# Patient Record
Sex: Male | Born: 1955 | Race: White | Hispanic: No | Marital: Married | Smoking: Never smoker
Health system: Southern US, Community
[De-identification: ages and names within clinical notes are randomized; demographics above are authoritative.]

## PROBLEM LIST (undated history)

## (undated) DIAGNOSIS — M1990 Unspecified osteoarthritis, unspecified site: Secondary | ICD-10-CM

## (undated) DIAGNOSIS — M549 Dorsalgia, unspecified: Secondary | ICD-10-CM

## (undated) DIAGNOSIS — E739 Lactose intolerance, unspecified: Secondary | ICD-10-CM

## (undated) DIAGNOSIS — G5760 Lesion of plantar nerve, unspecified lower limb: Secondary | ICD-10-CM

## (undated) DIAGNOSIS — I10 Essential (primary) hypertension: Secondary | ICD-10-CM

## (undated) DIAGNOSIS — E8881 Metabolic syndrome: Secondary | ICD-10-CM

## (undated) DIAGNOSIS — Z91018 Allergy to other foods: Secondary | ICD-10-CM

## (undated) DIAGNOSIS — G4730 Sleep apnea, unspecified: Secondary | ICD-10-CM

## (undated) DIAGNOSIS — T7840XA Allergy, unspecified, initial encounter: Secondary | ICD-10-CM

## (undated) DIAGNOSIS — E785 Hyperlipidemia, unspecified: Secondary | ICD-10-CM

## (undated) DIAGNOSIS — E119 Type 2 diabetes mellitus without complications: Secondary | ICD-10-CM

## (undated) DIAGNOSIS — F32A Depression, unspecified: Secondary | ICD-10-CM

## (undated) DIAGNOSIS — J189 Pneumonia, unspecified organism: Secondary | ICD-10-CM

## (undated) DIAGNOSIS — F419 Anxiety disorder, unspecified: Secondary | ICD-10-CM

## (undated) DIAGNOSIS — R7303 Prediabetes: Secondary | ICD-10-CM

## (undated) DIAGNOSIS — E669 Obesity, unspecified: Secondary | ICD-10-CM

## (undated) DIAGNOSIS — D649 Anemia, unspecified: Secondary | ICD-10-CM

## (undated) HISTORY — DX: Dorsalgia, unspecified: M54.9

## (undated) HISTORY — PX: UMBILICAL HERNIA REPAIR: SHX2598

## (undated) HISTORY — DX: Essential (primary) hypertension: I10

## (undated) HISTORY — DX: Obesity, unspecified: E66.9

## (undated) HISTORY — DX: Lesion of plantar nerve, unspecified lower limb: G57.60

## (undated) HISTORY — DX: Sleep apnea, unspecified: G47.30

## (undated) HISTORY — DX: Anemia, unspecified: D64.9

## (undated) HISTORY — DX: Unspecified osteoarthritis, unspecified site: M19.90

## (undated) HISTORY — DX: Anxiety disorder, unspecified: F41.9

## (undated) HISTORY — DX: Hyperlipidemia, unspecified: E78.5

## (undated) HISTORY — DX: Allergy, unspecified, initial encounter: T78.40XA

## (undated) HISTORY — DX: Allergy to other foods: Z91.018

## (undated) HISTORY — DX: Depression, unspecified: F32.A

## (undated) HISTORY — PX: APPENDECTOMY: SHX54

## (undated) HISTORY — PX: HERNIA REPAIR: SHX51

## (undated) HISTORY — DX: Lactose intolerance, unspecified: E73.9

---

## 2006-05-26 ENCOUNTER — Encounter: Admission: RE | Admit: 2006-05-26 | Discharge: 2006-05-26 | Payer: Self-pay | Admitting: Emergency Medicine

## 2006-07-05 ENCOUNTER — Ambulatory Visit (HOSPITAL_BASED_OUTPATIENT_CLINIC_OR_DEPARTMENT_OTHER): Admission: RE | Admit: 2006-07-05 | Discharge: 2006-07-05 | Payer: Self-pay | Admitting: Otolaryngology

## 2006-07-09 ENCOUNTER — Ambulatory Visit: Payer: Self-pay | Admitting: Internal Medicine

## 2008-11-28 HISTORY — PX: COLONOSCOPY: SHX174

## 2014-12-22 ENCOUNTER — Ambulatory Visit (INDEPENDENT_AMBULATORY_CARE_PROVIDER_SITE_OTHER): Payer: BC Managed Care – PPO | Admitting: Family Medicine

## 2014-12-22 ENCOUNTER — Encounter: Payer: Self-pay | Admitting: Family Medicine

## 2014-12-22 VITALS — BP 142/90 | HR 82 | Ht 67.0 in | Wt 218.0 lb

## 2014-12-22 DIAGNOSIS — G5762 Lesion of plantar nerve, left lower limb: Secondary | ICD-10-CM

## 2014-12-22 DIAGNOSIS — M9903 Segmental and somatic dysfunction of lumbar region: Secondary | ICD-10-CM

## 2014-12-22 DIAGNOSIS — M545 Low back pain, unspecified: Secondary | ICD-10-CM

## 2014-12-22 DIAGNOSIS — M999 Biomechanical lesion, unspecified: Secondary | ICD-10-CM

## 2014-12-22 DIAGNOSIS — M9904 Segmental and somatic dysfunction of sacral region: Secondary | ICD-10-CM

## 2014-12-22 DIAGNOSIS — M9902 Segmental and somatic dysfunction of thoracic region: Secondary | ICD-10-CM

## 2014-12-22 NOTE — Progress Notes (Signed)
[facility] [place], [place] [address] Phone: [phone] Subjective:     CC: Back pain  [id number] Lucas Young is a 59 y.o. male coming in with complaint of back pain.  States that this is been more of a chronic problem. Patient states this seems to be on the left side. Patient points to more of the thoracolumbar junction. Patient states that he is been to many different professionals for this including an orthopedic did take x-rays with no eye needs. Patient was given a steroid injection with no relief. Patient has tried many different over-the-counter medicines and some prescription medications with mild improvement. Patient has also gone to a massage therapist, acupuncture as well as chiropractor with once again minimal benefit. Patient continues to have recurrent pain. Patient states that it is very localized with no radiation. Patient denies any numbness or tingling in the extremities. Denies any fevers or chills or any abnormal weight loss. Denies any nighttime awakening. Patient rates the severity of pain a 7 out of 10. Hurts more when he is sitting still. Feels better when he is doing activity but then have spasming later on in the day.    Past medical history, social, surgical and family history all reviewed in electronic medical record.   Review of Systems: No headache, visual changes, nausea, vomiting, diarrhea, constipation, dizziness, abdominal pain, skin rash, fevers, chills, night sweats, weight loss, swollen lymph nodes, body aches, joint swelling, muscle aches, chest pain, shortness of breath, mood changes.   Objective Blood pressure 142/90, pulse 82, height 5\' 7"  (1.702 m), weight 218 lb (98.884 kg), SpO2 95 %.  General: No apparent distress alert and oriented x3 mood and affect normal, dressed appropriately. Overweight HEENT: Pupils equal, extraocular movements intact  Respiratory: Patient's speak in full sentences  and does not appear short of breath  Cardiovascular: No lower extremity edema, non tender, no erythema  Skin: Warm dry intact with no signs of infection or rash on extremities or on axial skeleton.  Abdomen: Soft nontender  Neuro: Cranial nerves II through XII are intact, neurovascularly intact in all extremities with 2+ DTRs and 2+ pulses.  Lymph: No lymphadenopathy of posterior or anterior cervical chain or axillae bilaterally.  Gait normal with good balance and coordination.  MSK:  Non tender with full range of motion and good stability and symmetric strength and tone of shoulders, elbows, wrist, hip, knee and ankles bilaterally.  Back Exam:  Inspection: Mild increase in lordosis Motion: Flexion 45 deg, Extension 45 deg, Side Bending to 45 deg bilaterally,  Rotation to 45 deg bilaterally  SLR laying: Negative  XSLR laying: Negative  Palpable tenderness: Moderate tenderness to palpation over the paraspinal musculature of the lumbar spine especially of her L2 FABER: Mild increased tightness on left side dividing tightness of the hip flexors bilaterally left greater than right Sensory change: Gross sensation intact to all lumbar and sacral dermatomes.  Reflexes: 2+ at both patellar tendons, 2+ at achilles tendons, Babinski's downgoing.  Strength at foot  Plantar-flexion: 5/5 Dorsi-flexion: 5/5 Eversion: 5/5 Inversion: 5/5  Leg strength  Quad: 5/5 Hamstring: 5/5 Hip flexor: 5/5 Hip abductors: 3+/5 but symmetric Gait unremarkable.  OMT Physical Exam   Standing flexion right  Seated Flexion right  Cervical  Neutral  Thoracic T5 extended rotated and side bent right T7 extended rotated and side bent left  Lumbar L2 flexed rotated and side bent left  Sacrum Right on right  Illium  Neutral  Impression and Recommendations:     This case required medical decision making of moderate complexity.

## 2014-12-22 NOTE — Assessment & Plan Note (Signed)
Decision today to treat with OMT was based on Physical Exam  After verbal consent patient was treated with HVLA, ME techniques in thoracic, lumbar and sacral areas  Patient tolerated the procedure well with improvement in symptoms  Patient given exercises, stretches and lifestyle modifications  See medications in patient instructions if given  Patient will follow up in 2-3 weeks          

## 2014-12-22 NOTE — Assessment & Plan Note (Signed)
Patient did bring up this problem when he was leaving. We will address in greater detail at follow-up.

## 2014-12-22 NOTE — Assessment & Plan Note (Signed)
Patient's low back pain seems to be multifactorial. The underlying problem is secondary to the tight hip flexors and the weak hip abductors. Think that this is causing chronic strain on his hip flexor that originates at the L2 vertebrae. Patient did respond fairly well to osteopathic manipulation today. We discussed over-the-counter medicines and patient was given a topical anti-inflammatory. We discussed home exercises and patient did work with a Product/process development scientist and grade detail today. She'll try to make these changes and come back in 2-3 weeks. Continuing to have difficulty imaging may be necessary. I anticipate though patient doing very well. We also discussed ergonomic changes at work.

## 2014-12-22 NOTE — Progress Notes (Signed)
Pre visit review using our clinic review tool, if applicable. No additional management support is needed unless otherwise documented below in the visit note. 

## 2014-12-22 NOTE — Patient Instructions (Signed)
Good to see you Ice 20 minutes 2 times daily. Usually after activity and before bed. Exercises 3 times a week.  Exercises on wall.  Heel and butt touching.  Raise leg 6 inches and hold 2 seconds.  Down slow for count of 4 seconds.  1 set of 30 reps daily on both sides.  Turmeric 500mg  twice daily Vitamin D 2000 IU daily When sitting, at desk have monitor at eye level.  Roll up towel under knees with sitting Pennsaid twice daily Try ice bath at night for 20 minutes.  See me again  In 2-3 weeks.

## 2015-01-12 ENCOUNTER — Ambulatory Visit (INDEPENDENT_AMBULATORY_CARE_PROVIDER_SITE_OTHER): Payer: BC Managed Care – PPO | Admitting: Family Medicine

## 2015-01-12 ENCOUNTER — Encounter: Payer: Self-pay | Admitting: Family Medicine

## 2015-01-12 VITALS — BP 144/90 | HR 80 | Ht 67.0 in | Wt 220.0 lb

## 2015-01-12 DIAGNOSIS — M9902 Segmental and somatic dysfunction of thoracic region: Secondary | ICD-10-CM

## 2015-01-12 DIAGNOSIS — G5762 Lesion of plantar nerve, left lower limb: Secondary | ICD-10-CM

## 2015-01-12 DIAGNOSIS — M9903 Segmental and somatic dysfunction of lumbar region: Secondary | ICD-10-CM

## 2015-01-12 DIAGNOSIS — M545 Low back pain, unspecified: Secondary | ICD-10-CM

## 2015-01-12 DIAGNOSIS — M9904 Segmental and somatic dysfunction of sacral region: Secondary | ICD-10-CM

## 2015-01-12 DIAGNOSIS — M999 Biomechanical lesion, unspecified: Secondary | ICD-10-CM

## 2015-01-12 NOTE — Assessment & Plan Note (Signed)
Patient will be fitted with orthotics at follow-up.

## 2015-01-12 NOTE — Assessment & Plan Note (Signed)
Patient's back pain is still secondary 10 hip flexor tightness. We discussed different exercises at home and self manual massage techniques today. Patient was showed proper technique of some the exercises. We discussed icing regimen and home exercises as well as the vitamin supplementation. Patient will come back and see me again in 3-4 weeks for further evaluation and treatment.

## 2015-01-12 NOTE — Assessment & Plan Note (Signed)
Decision today to treat with OMT was based on Physical Exam  After verbal consent patient was treated with HVLA, ME techniques in thoracic, lumbar and sacral areas  Patient tolerated the procedure well with improvement in symptoms  Patient given exercises, stretches and lifestyle modifications  See medications in patient instructions if given  Patient will follow up in 5 weeks

## 2015-01-12 NOTE — Progress Notes (Signed)
  [name] [address] [place], [place] [address] Phone: [phone] Subjective:     CC: Back pain , follow-up  [id number] Lucas Young is a 59 y.o. male coming in with complaint of back pain.  Patient did have more of a hip flexor spasm. Patient states that overall he has been doing somewhat better especially because he is started going to yoga class on a more regular basis. Patient states that the pain is more of a dull throbbing sensation is has only to spasm since last time. Denies any numbness or tingling. States that this is not stopping him from daily activities. Overall probably 60% better.  Patient is also had pain in his foot. Patient left foot. Patient has had a Morton's neuroma and states that it is moderately improved. Not stopping him from any activities at this time.   Past medical history, social, surgical and family history all reviewed in electronic medical record.   Review of Systems: No headache, visual changes, nausea, vomiting, diarrhea, constipation, dizziness, abdominal pain, skin rash, fevers, chills, night sweats, weight loss, swollen lymph nodes, body aches, joint swelling, muscle aches, chest pain, shortness of breath, mood changes.   Objective Blood pressure 144/90, weight 220 lb (99.791 kg).  General: No apparent distress alert and oriented x3 mood and affect normal, dressed appropriately. Overweight HEENT: Pupils equal, extraocular movements intact  Respiratory: Patient's speak in full sentences and does not appear short of breath  Cardiovascular: No lower extremity edema, non tender, no erythema  Skin: Warm dry intact with no signs of infection or rash on extremities or on axial skeleton.  Abdomen: Soft nontender  Neuro: Cranial nerves II through XII are intact, neurovascularly intact in all extremities with 2+ DTRs and 2+ pulses.  Lymph: No lymphadenopathy of posterior or anterior cervical chain or axillae  bilaterally.  Gait normal with good balance and coordination.  MSK:  Non tender with full range of motion and good stability and symmetric strength and tone of shoulders, elbows, wrist, hip, knee and ankles bilaterally.   Foot exam shows the patient does have mild overpronation of the hindfoot as well as severe right and of the transverse arch bilaterally left greater than right. Patient has mild bunion and bunionette formation. \ Back Exam:  Inspection: Mild increase in lordosis Motion: Flexion 45 deg, Extension 45 deg, Side Bending to 45 deg bilaterally,  Rotation to 45 deg bilaterally  SLR laying: Negative  XSLR laying: Negative  Palpable tenderness: This over the T12-L1 region on left side FABER: Mild increased tightness on left side hip flexor tightness of the hip flexors bilaterally left greater than right continues been mild improvement from previous exam Sensory change: Gross sensation intact to all lumbar and sacral dermatomes.  Reflexes: 2+ at both patellar tendons, 2+ at achilles tendons, Babinski's downgoing.  Strength at foot  Plantar-flexion: 5/5 Dorsi-flexion: 5/5 Eversion: 5/5 Inversion: 5/5  Leg strength  Quad: 5/5 Hamstring: 5/5 Hip flexor: 5/5 Hip abductors: 3+/5 but symmetric Gait unremarkable.  OMT Physical Exam   Standing flexion right  Seated Flexion right  Cervical  Neutral  Thoracic T5 extended rotated and side bent right T7 extended rotated and side bent left  Lumbar L1 flexed rotated and side bent left  Sacrum Right on right  Illium  Neutral     Impression and Recommendations:     This case required medical decision making of moderate complexity.

## 2015-01-12 NOTE — Progress Notes (Signed)
Pre visit review using our clinic review tool, if applicable. No additional management support is needed unless otherwise documented below in the visit note. 

## 2015-01-12 NOTE — Patient Instructions (Addendum)
Good to see you Continue what you are doing Yoga is doing great Continue the exercises Nothing new.  COnsider when spsam try tennisball under rib and localize forces with a weight in left hand over area and breath deep.  Lets go 5 weeks.

## 2015-01-26 ENCOUNTER — Ambulatory Visit (INDEPENDENT_AMBULATORY_CARE_PROVIDER_SITE_OTHER): Payer: BC Managed Care – PPO | Admitting: Family Medicine

## 2015-01-26 ENCOUNTER — Encounter: Payer: Self-pay | Admitting: Family Medicine

## 2015-01-26 DIAGNOSIS — G5762 Lesion of plantar nerve, left lower limb: Secondary | ICD-10-CM | POA: Diagnosis not present

## 2015-01-26 NOTE — Assessment & Plan Note (Signed)
Patient does need custom orthotics today and was given a metatarsal pad. I think that this will be helpful and we'll stop patient's progression. We discussed icing, and continuing the same conservative therapy and patient will come back in 2-3 weeks for further evaluation and treatment.

## 2015-01-26 NOTE — Progress Notes (Signed)
Procedure Note   Patient was fitted for a : standard, cushioned, semi-rigid orthotic. The orthotic was heated and afterward the patient patient seated position and molded The patient was positioned in subtalar neutral position and 10 degrees of ankle dorsiflexion in a weight bearing stance. After completion of molding, patient did have orthotic management The blank was ground to a stable position for weight bearing. Size:10 Base: Carbon fiber Additional Posting and Padding: Metatarsal pad The patient ambulated these, and they were very comfortable.

## 2015-01-26 NOTE — Progress Notes (Signed)
Pre visit review using our clinic review tool, if applicable. No additional management support is needed unless otherwise documented below in the visit note. 

## 2015-02-12 ENCOUNTER — Encounter: Payer: Self-pay | Admitting: Family Medicine

## 2015-02-12 ENCOUNTER — Telehealth: Payer: Self-pay | Admitting: Family Medicine

## 2015-02-16 ENCOUNTER — Ambulatory Visit (INDEPENDENT_AMBULATORY_CARE_PROVIDER_SITE_OTHER): Payer: BC Managed Care – PPO | Admitting: Family Medicine

## 2015-02-16 ENCOUNTER — Encounter: Payer: Self-pay | Admitting: Family Medicine

## 2015-02-16 VITALS — BP 142/88 | HR 64 | Wt 214.0 lb

## 2015-02-16 DIAGNOSIS — M9902 Segmental and somatic dysfunction of thoracic region: Secondary | ICD-10-CM

## 2015-02-16 DIAGNOSIS — M9904 Segmental and somatic dysfunction of sacral region: Secondary | ICD-10-CM

## 2015-02-16 DIAGNOSIS — M545 Low back pain, unspecified: Secondary | ICD-10-CM

## 2015-02-16 DIAGNOSIS — M999 Biomechanical lesion, unspecified: Secondary | ICD-10-CM

## 2015-02-16 DIAGNOSIS — M9903 Segmental and somatic dysfunction of lumbar region: Secondary | ICD-10-CM

## 2015-02-16 NOTE — Assessment & Plan Note (Signed)
Patient low back pain is doing significantly better. Patient's yoga class epical be beneficial and patient's orthotics that he has for his shoes now is doing much better as well. Am hoping that this will continue his alignment. Patient did respond well to osteopathic manipulation today and will come back and see me again in 6 weeks for further evaluation and treatment.

## 2015-02-16 NOTE — Assessment & Plan Note (Signed)
Decision today to treat with OMT was based on Physical Exam  After verbal consent patient was treated with HVLA, ME techniques in thoracic, lumbar and sacral areas  Patient tolerated the procedure well with improvement in symptoms  Patient given exercises, stretches and lifestyle modifications  See medications in patient instructions if given  Patient will follow up in 6 weeks

## 2015-02-16 NOTE — Progress Notes (Signed)
Pre visit review using our clinic review tool, if applicable. No additional management support is needed unless otherwise documented below in the visit note. 

## 2015-02-16 NOTE — Progress Notes (Signed)
  [facility] [place], [place] [address] Phone: [phone] Subjective:     CC: Back pain , follow-up  [id number] Lucas Young is a 59 y.o. male coming in with complaint of back pain.  Patient did have more of a hip flexor spasm. Patient states that overall he has been doing somewhat better especially because he is started going to yoga class on a more regular basis. Patient has not had any flares at all and continues to take the natural supplementations. Patient has been very happy overall.  Patient's foot pain has improved because the orthotics.   Past medical history, social, surgical and family history all reviewed in electronic medical record.   Review of Systems: No headache, visual changes, nausea, vomiting, diarrhea, constipation, dizziness, abdominal pain, skin rash, fevers, chills, night sweats, weight loss, swollen lymph nodes, body aches, joint swelling, muscle aches, chest pain, shortness of breath, mood changes.   Objective Blood pressure 142/88, pulse 64, weight 214 lb (97.07 kg).  General: No apparent distress alert and oriented x3 mood and affect normal, dressed appropriately. Overweight HEENT: Pupils equal, extraocular movements intact  Respiratory: Patient's speak in full sentences and does not appear short of breath  Cardiovascular: No lower extremity edema, non tender, no erythema  Skin: Warm dry intact with no signs of infection or rash on extremities or on axial skeleton.  Abdomen: Soft nontender  Neuro: Cranial nerves II through XII are intact, neurovascularly intact in all extremities with 2+ DTRs and 2+ pulses.  Lymph: No lymphadenopathy of posterior or anterior cervical chain or axillae bilaterally.  Gait normal with good balance and coordination.  MSK:  Non tender with full range of motion and good stability and symmetric strength and tone of shoulders, elbows, wrist, hip, knee and ankles bilaterally.     Foot exam shows the patient does have mild overpronation of the hindfoot as well as severe right and of the transverse arch bilaterally left greater than right. Patient has mild bunion and bunionette formation. \ Back Exam:  Inspection: Mild increase in lordosis Motion: Flexion 45 deg, Extension 45 deg, Side Bending to 45 deg bilaterally,  Rotation to 45 deg bilaterally  SLR laying: Negative  XSLR laying: Negative  Palpable tenderness: This over the T12-L1 region on left side FABER: Decreased hip flexors Sensory change: Gross sensation intact to all lumbar and sacral dermatomes.  Reflexes: 2+ at both patellar tendons, 2+ at achilles tendons, Babinski's downgoing.  Strength at foot  Plantar-flexion: 5/5 Dorsi-flexion: 5/5 Eversion: 5/5 Inversion: 5/5  Leg strength  Quad: 5/5 Hamstring: 5/5 Hip flexor: 5/5 Hip abductors: 3+/5 but symmetric Gait unremarkable.  OMT Physical Exam  Cervical  Neutral  Thoracic T5 extended rotated and side bent right T7 extended rotated and side bent left  Lumbar L1 flexed rotated and side bent left  Sacrum Right on right  Illium  Neutral     Impression and Recommendations:     This case required medical decision making of moderate complexity.

## 2015-02-16 NOTE — Patient Instructions (Signed)
Good to see you Ice is your friend I love the idea of the yoga class Continue the vitamins I hope the orthotics continue to help Lucas Young. Or Lucas Young See you in 6 weeks.

## 2015-03-05 NOTE — Telephone Encounter (Signed)
Contacted regarding orthotics question

## 2015-03-20 ENCOUNTER — Encounter: Payer: Self-pay | Admitting: Family Medicine

## 2015-03-31 ENCOUNTER — Ambulatory Visit: Payer: BC Managed Care – PPO | Admitting: Family Medicine

## 2015-03-31 DIAGNOSIS — Z0289 Encounter for other administrative examinations: Secondary | ICD-10-CM

## 2015-04-01 ENCOUNTER — Encounter: Payer: Self-pay | Admitting: Family Medicine

## 2015-04-01 ENCOUNTER — Ambulatory Visit (INDEPENDENT_AMBULATORY_CARE_PROVIDER_SITE_OTHER): Payer: BC Managed Care – PPO | Admitting: Family Medicine

## 2015-04-01 VITALS — BP 122/84 | HR 73 | Ht 67.0 in | Wt 215.0 lb

## 2015-04-01 DIAGNOSIS — M9902 Segmental and somatic dysfunction of thoracic region: Secondary | ICD-10-CM | POA: Diagnosis not present

## 2015-04-01 DIAGNOSIS — M9904 Segmental and somatic dysfunction of sacral region: Secondary | ICD-10-CM | POA: Diagnosis not present

## 2015-04-01 DIAGNOSIS — M9903 Segmental and somatic dysfunction of lumbar region: Secondary | ICD-10-CM

## 2015-04-01 DIAGNOSIS — M545 Low back pain, unspecified: Secondary | ICD-10-CM

## 2015-04-01 DIAGNOSIS — M999 Biomechanical lesion, unspecified: Secondary | ICD-10-CM

## 2015-04-01 NOTE — Progress Notes (Signed)
Pre visit review using our clinic review tool, if applicable. No additional management support is needed unless otherwise documented below in the visit note. 

## 2015-04-01 NOTE — Patient Instructions (Addendum)
You are doing great The classes are working perfect Continue to work on the balancing of the pelvis.  The piriformis is great to focus on.  Ice can still help See me 6-8 weeks.

## 2015-04-01 NOTE — Assessment & Plan Note (Signed)
Patient is doing significantly better. Encourage patient to continue to work on core strengthening potentially weight loss could be beneficial. Patient will continue with his yoga class as well as massage and other modalities that patient is doing at this time. We will make no significant changes in patient will come back and see me again in 6-8 weeks for further evaluation and treatment.

## 2015-04-01 NOTE — Assessment & Plan Note (Signed)
Decision today to treat with OMT was based on Physical Exam  After verbal consent patient was treated with HVLA, ME techniques in thoracic, lumbar and sacral areas  Patient tolerated the procedure well with improvement in symptoms  Patient given exercises, stretches and lifestyle modifications  See medications in patient instructions if given  Patient will follow up in 6-8 weeks    

## 2015-04-01 NOTE — Progress Notes (Signed)
  [name] [place], [place] [address] Phone: [phone] Subjective:     CC: Back pain , follow-up  [id number] Lucas Young is a 59 y.o. male coming in with complaint of back pain.  Patient did have more of a hip flexor spasm. Patient states that overall he has been doing somewhat better especially because he is started going to yoga class on a more regular basis. Patient is now working on the tightness of his hips as well. Patient has been working on the piriformis muscle and has noticed some improvement. Patient still has some chronic dull aching pain from time to time but overall continues to improve.    Past medical history, social, surgical and family history all reviewed in electronic medical record.   Review of Systems: No headache, visual changes, nausea, vomiting, diarrhea, constipation, dizziness, abdominal pain, skin rash, fevers, chills, night sweats, weight loss, swollen lymph nodes, body aches, joint swelling, muscle aches, chest pain, shortness of breath, mood changes.   Objective Blood pressure 122/84, pulse 73, height 5\' 7"  (1.702 m), weight 215 lb (97.523 kg), SpO2 94 %.  General: No apparent distress alert and oriented x3 mood and affect normal, dressed appropriately. Overweight HEENT: Pupils equal, extraocular movements intact  Respiratory: Patient's speak in full sentences and does not appear short of breath  Cardiovascular: No lower extremity edema, non tender, no erythema  Skin: Warm dry intact with no signs of infection or rash on extremities or on axial skeleton.  Abdomen: Soft nontender  Neuro: Cranial nerves II through XII are intact, neurovascularly intact in all extremities with 2+ DTRs and 2+ pulses.  Lymph: No lymphadenopathy of posterior or anterior cervical chain or axillae bilaterally.  Gait normal with good balance and coordination.  MSK:  Non tender with full range of motion and good  stability and symmetric strength and tone of shoulders, elbows, wrist, hip, knee and ankles bilaterally.   Foot exam shows the patient does have mild overpronation of the hindfoot as well as severe right and of the transverse arch bilaterally left greater than right. Patient has mild bunion and bunionette formation.  Back Exam:  Inspection: Mild increase in lordosis Motion: Flexion 45 deg, Extension 45 deg, Side Bending to 45 deg bilaterally,  Rotation to 45 deg bilaterally  SLR laying: Negative  XSLR laying: Negative  Palpable tenderness: Minimal of the paraspinal musculature of the lumbar spine. FABER: Improvement in range of motion but continues to be positive bilaterally Sensory change: Gross sensation intact to all lumbar and sacral dermatomes.  Reflexes: 2+ at both patellar tendons, 2+ at achilles tendons, Babinski's downgoing.  Strength at foot  Plantar-flexion: 5/5 Dorsi-flexion: 5/5 Eversion: 5/5 Inversion: 5/5  Leg strength  Quad: 5/5 Hamstring: 5/5 Hip flexor: 5/5 Hip abductors: 4+/5 but symmetric and improving from previous exam Gait unremarkable.  OMT Physical Exam  Cervical  Neutral  Thoracic T5 extended rotated and side bent right T7 extended rotated and side bent left  Lumbar L1 flexed rotated and side bent left  Sacrum Right on right  Illium  Neutral     Impression and Recommendations:     This case required medical decision making of moderate complexity.

## 2015-05-18 ENCOUNTER — Encounter: Payer: Self-pay | Admitting: Family Medicine

## 2015-05-18 ENCOUNTER — Ambulatory Visit (INDEPENDENT_AMBULATORY_CARE_PROVIDER_SITE_OTHER): Payer: BC Managed Care – PPO | Admitting: Family Medicine

## 2015-05-18 VITALS — BP 132/84 | HR 58 | Ht 67.0 in | Wt 220.0 lb

## 2015-05-18 DIAGNOSIS — M999 Biomechanical lesion, unspecified: Secondary | ICD-10-CM

## 2015-05-18 DIAGNOSIS — M9903 Segmental and somatic dysfunction of lumbar region: Secondary | ICD-10-CM

## 2015-05-18 DIAGNOSIS — M9902 Segmental and somatic dysfunction of thoracic region: Secondary | ICD-10-CM

## 2015-05-18 DIAGNOSIS — M9904 Segmental and somatic dysfunction of sacral region: Secondary | ICD-10-CM | POA: Diagnosis not present

## 2015-05-18 DIAGNOSIS — M545 Low back pain, unspecified: Secondary | ICD-10-CM

## 2015-05-18 NOTE — Progress Notes (Signed)
  [name] [place], [place] [address] Phone: [phone] Subjective:     CC: Back pain , follow-up  [id number] Lucas Young is a 59 y.o. male coming in with complaint of back pain.  Patient did have more of a hip flexor spasm. Patient states that overall he has been doing somewhat better especially because he is started going to yoga class on a more regular basis. Denies any new symptoms. Overall seems to be doing relatively well. States that he still has some mild discomfort from time to time but no significant exacerbations acute is having previously. Patient states over the course last week it seems to be worsening. Denies any new symptoms. Just worsening of previous symptoms. Denies any radiation down the leg. 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    Past medical history, social, surgical and family history all reviewed in electronic medical record.   Review of Systems: No headache, visual changes, nausea, vomiting, diarrhea, constipation, dizziness, abdominal pain, skin rash, fevers, chills, night sweats, weight loss, swollen lymph nodes, body aches, joint swelling, muscle aches, chest pain, shortness of breath, mood changes.   Objective Blood pressure 132/84, pulse 58, height 5\' 7"  (1.702 m), weight 220 lb (99.791 kg), SpO2 96 %.  General: No apparent distress alert and oriented x3 mood and affect normal, dressed appropriately. Overweight HEENT: Pupils equal, extraocular movements intact  Respiratory: Patient's speak in full sentences and does not appear short of breath  Cardiovascular: No lower extremity edema, non tender, no erythema  Skin: Warm dry intact with no signs of infection or rash on extremities or on axial skeleton.  Abdomen: Soft nontender  Neuro: Cranial nerves II through XII are intact, neurovascularly intact in all extremities with 2+ DTRs and 2+  pulses.  Lymph: No lymphadenopathy of posterior or anterior cervical chain or axillae bilaterally.  Gait normal with good balance and coordination.  MSK:  Non tender with full range of motion and good stability and symmetric strength and tone of shoulders, elbows, wrist, hip, knee and ankles bilaterally.   Foot exam shows the patient does have mild overpronation of the hindfoot as well as severe right and of the transverse arch bilaterally left greater than right. Patient has mild bunion and bunionette formation.  Back Exam:  Inspection: Mild increase in lordosis Motion: Flexion 45 deg, Extension 45 deg, Side Bending to 45 deg bilaterally,  Rotation to 45 deg bilaterally  SLR laying: Negative  XSLR laying: Negative  Palpable tenderness: Mild increase in tenderness over the paraspinal musculature FABER: Mark tightness from previous exam  Sensory change: Gross sensation intact to all lumbar and sacral dermatomes.  Reflexes: 2+ at both patellar tendons, 2+ at achilles tendons, Babinski's downgoing.  Strength at foot  Plantar-flexion: 5/5 Dorsi-flexion: 5/5 Eversion: 5/5 Inversion: 5/5  Leg strength  Quad: 5/5 Hamstring: 5/5 Hip flexor: 5/5 Hip abductors: 4+/5 but symmetric with no improvement from previous exam.  Gait unremarkable.  OMT Physical Exam  Cervical  Neutral  Thoracic T5 extended rotated and side bent right T7 extended rotated and side bent left  Lumbar L1 flexed rotated and side bent left  Sacrum Right on right  Illium  Neutral     Impression and Recommendations:     This case required medical decision making of moderate complexity.

## 2015-05-18 NOTE — Assessment & Plan Note (Signed)
Decision today to treat with OMT was based on Physical Exam  After verbal consent patient was treated with HVLA, ME techniques in thoracic, lumbar and sacral areas  Patient tolerated the procedure well with improvement in symptoms  Patient given exercises, stretches and lifestyle modifications  See medications in patient instructions if given  Patient will follow up in 4-6 weeks

## 2015-05-18 NOTE — Assessment & Plan Note (Signed)
Patient back pain is secondary to the piriformis, hip flexor, as well as hip abductor weakness. We discussed again about trying to improve the course strength bending. I do think the patient has been doing very well to increase some strengthening exercises inserted just range of motion. Patient will try to make these different changes and come back and see me again in 4-6 weeks after patient traveling is done.

## 2015-05-18 NOTE — Patient Instructions (Addendum)
Good to see you Ice is your friend when you need it.  Continue the exercises and the yoga, I think they are helping you! Duexis 3 times a day for 3 days if needed Lets go 4-5 weeks.

## 2015-05-18 NOTE — Progress Notes (Signed)
Pre visit review using our clinic review tool, if applicable. No additional management support is needed unless otherwise documented below in the visit note. 

## 2015-07-01 ENCOUNTER — Ambulatory Visit (INDEPENDENT_AMBULATORY_CARE_PROVIDER_SITE_OTHER): Payer: BC Managed Care – PPO | Admitting: Family Medicine

## 2015-07-01 ENCOUNTER — Encounter: Payer: Self-pay | Admitting: Family Medicine

## 2015-07-01 VITALS — BP 136/88 | HR 65 | Ht 67.0 in | Wt 222.0 lb

## 2015-07-01 DIAGNOSIS — M545 Low back pain, unspecified: Secondary | ICD-10-CM

## 2015-07-01 DIAGNOSIS — M9904 Segmental and somatic dysfunction of sacral region: Secondary | ICD-10-CM

## 2015-07-01 DIAGNOSIS — M9903 Segmental and somatic dysfunction of lumbar region: Secondary | ICD-10-CM

## 2015-07-01 DIAGNOSIS — M9902 Segmental and somatic dysfunction of thoracic region: Secondary | ICD-10-CM

## 2015-07-01 DIAGNOSIS — M999 Biomechanical lesion, unspecified: Secondary | ICD-10-CM

## 2015-07-01 NOTE — Patient Instructions (Addendum)
Good to see you Ice is your friend  \Get back to the routine.  Sorry your back in the real world See me again in 6 weeks.

## 2015-07-01 NOTE — Assessment & Plan Note (Signed)
Decision today to treat with OMT was based on Physical Exam  After verbal consent patient was treated with HVLA, ME techniques in thoracic, lumbar and sacral areas  Patient tolerated the procedure well with improvement in symptoms  Patient given exercises, stretches and lifestyle modifications  See medications in patient instructions if given  Patient will follow up in 4-6 weeks

## 2015-07-01 NOTE — Progress Notes (Signed)
Pre visit review using our clinic review tool, if applicable. No additional management support is needed unless otherwise documented below in the visit note. 

## 2015-07-01 NOTE — Progress Notes (Signed)
  [name] [place], [place] [address] Phone: [phone] Subjective:     CC: Back pain , follow-up  [id number] Lucas Young is a 59 y.o. male coming in with complaint of back pain.  Patient has been doing very well with yoga, home exercises, over-the-counter natural supplements, as well as osteopathic manipulation. Patient states overall he is doing very well. Patient discussed back from a 3 week vacation. Patient states that now that he is getting back into the regular regimen history and have some mild increase in low back pain. Patient was not working out as frequently as he was previously. Continues to take the vitamins. No significant new changes to some mild increasing tightness of the lower back.    Past medical history, social, surgical and family history all reviewed in electronic medical record.   Review of Systems: No headache, visual changes, nausea, vomiting, diarrhea, constipation, dizziness, abdominal pain, skin rash, fevers, chills, night sweats, weight loss, swollen lymph nodes, body aches, joint swelling, muscle aches, chest pain, shortness of breath, mood changes.   Objective Blood pressure 136/88, pulse 65, height 5\' 7"  (1.702 m), weight 222 lb (100.699 kg), SpO2 95 %.  General: No apparent distress alert and oriented x3 mood and affect normal, dressed appropriately. Overweight HEENT: Pupils equal, extraocular movements intact  Respiratory: Patient's speak in full sentences and does not appear short of breath  Cardiovascular: No lower extremity edema, non tender, no erythema  Skin: Warm dry intact with no signs of infection or rash on extremities or on axial skeleton.  Abdomen: Soft nontender  Neuro: Cranial nerves II through XII are intact, neurovascularly intact in all extremities with 2+ DTRs and 2+ pulses.  Lymph: No lymphadenopathy of posterior or anterior cervical chain or axillae bilaterally.  Gait  normal with good balance and coordination.  MSK:  Non tender with full range of motion and good stability and symmetric strength and tone of shoulders, elbows, wrist, hip, knee and ankles bilaterally.   Foot exam shows the patient does have mild overpronation of the hindfoot as well as severe right and of the transverse arch bilaterally left greater than right. Patient has mild bunion and bunionette formation.  Back Exam:  Inspection: Mild increase in lordosis Motion: Flexion 45 deg, Extension 45 deg, Side Bending to 45 deg bilaterally,  Rotation to 45 deg bilaterally  SLR laying: Negative  XSLR laying: Negative  Palpable tenderness: Mild increase in tenderness over the paraspinal musculature FABER: Mark tightness from previous exam  Sensory change: Gross sensation intact to all lumbar and sacral dermatomes.  Reflexes: 2+ at both patellar tendons, 2+ at achilles tendons, Babinski's downgoing.  Strength at foot  Plantar-flexion: 5/5 Dorsi-flexion: 5/5 Eversion: 5/5 Inversion: 5/5  Leg strength  Quad: 5/5 Hamstring: 5/5 Hip flexor: 5/5 Hip abductors: 4+/5 but symmetric with no improvement from previous exam.  Gait unremarkable.  OMT Physical Exam  Cervical  Neutral  Thoracic T5 extended rotated and side bent right T7 extended rotated and side bent left  Lumbar L1 flexed rotated and side bent left  Sacrum Right on right  Illium  Neutral     Impression and Recommendations:     This case required medical decision making of moderate complexity.

## 2015-07-01 NOTE — Assessment & Plan Note (Signed)
Patient's back pain is likely multifactorial. Encourage him to continue to work on core strengthening. Patient will restart yoga. I think patient when he gets back into his routine will do very well. Patient come back and see me again in 4-6 weeks.

## 2015-08-03 ENCOUNTER — Ambulatory Visit (INDEPENDENT_AMBULATORY_CARE_PROVIDER_SITE_OTHER): Payer: BC Managed Care – PPO | Admitting: Urgent Care

## 2015-08-03 VITALS — BP 140/90 | HR 67 | Temp 98.2°F | Resp 20 | Ht 67.5 in | Wt 213.1 lb

## 2015-08-03 DIAGNOSIS — L03012 Cellulitis of left finger: Secondary | ICD-10-CM

## 2015-08-03 DIAGNOSIS — M79642 Pain in left hand: Secondary | ICD-10-CM

## 2015-08-03 DIAGNOSIS — IMO0001 Reserved for inherently not codable concepts without codable children: Secondary | ICD-10-CM | POA: Insufficient documentation

## 2015-08-03 MED ORDER — MUPIROCIN 2 % EX OINT: 1.0000 "application " | TOPICAL_OINTMENT | Freq: Three times a day (TID) | CUTANEOUS | Status: DC

## 2015-08-03 NOTE — Patient Instructions (Signed)

## 2015-08-03 NOTE — Progress Notes (Signed)
    MRN: [id number] DOB: [birth year]  Subjective:   Lucas Young is a 59 y.o. male presenting for chief complaint of Hand Pain  Reports 1 day history of left middle finger. Finger is red and painful, has become swollen. He cannot recall any specific trauma but thinks it may have happened while he was doing hedge trimming. Has tried ibuprofen with minimal relief. Denies fever, drainage of pus or bleeding. Denies any other aggravating or relieving factors, no other questions or concerns.  Joie currently has no medications in their medication list. Also has No Known Allergies.  [place]  has a past medical history of Allergy. Also  has past surgical history that includes Appendectomy.  Objective:   Vitals: BP 140/90 mmHg  Pulse 67  Temp(Src) 98.2 F (36.8 C) (Oral)  Resp 20  Ht 5' 7.5" (1.715 m)  Wt 213 lb 2 oz (96.673 kg)  BMI 32.87 kg/m2  SpO2 96%  Physical Exam  Constitutional: He is oriented to person, place, and time. He appears well-developed and well-nourished.  Cardiovascular: Normal rate.   Pulmonary/Chest: Effort normal.  Musculoskeletal:       Left hand: He exhibits tenderness and swelling. He exhibits normal range of motion, no bony tenderness, normal capillary refill, no deformity and no laceration. Normal sensation noted. Normal strength noted.       Hands: Neurological: He is alert and oriented to person, place, and time.  Skin: Skin is warm and dry. No rash noted. No erythema. No pallor.   PROCEDURE NOTE: I&D of Abscess Verbal consent obtained. Local anesthesia with 4cc of 0.5% Marcaine. Site cleansed with alcohol pad prior to injecting numbing medication. Incision of 0.5cm was made using a 11 blade, discharge of copious amounts of pus and serosanguinous fluid. Wound cavity was explored with Adson forceps for loculations and drained further. Cleansed and dressed. After care instructions provided.  Assessment and Plan :   1. Paronychia of  third finger of left hand - Performed I&D of his paronychia. Start topical Bactroban TID. - Wound culture pending. - Will hold off on oral antibiotics but if patient does not see significant improvement, will start course corresponding with culture results.  Jaynee Eagles, PA-C [facility] [phone] 08/03/2015 8:22 AM

## 2015-08-03 NOTE — Addendum Note (Signed)
Addended byCandice Camp on: 08/03/2015 09:05 AM   Modules accepted: Orders

## 2015-08-07 LAB — WOUND CULTURE
GRAM STAIN: NONE SEEN
Gram Stain: NONE SEEN

## 2015-08-10 ENCOUNTER — Encounter: Payer: Self-pay | Admitting: Family Medicine

## 2015-08-10 ENCOUNTER — Ambulatory Visit (INDEPENDENT_AMBULATORY_CARE_PROVIDER_SITE_OTHER): Payer: BC Managed Care – PPO | Admitting: Family Medicine

## 2015-08-10 VITALS — BP 136/84 | HR 67 | Ht 67.5 in | Wt 211.0 lb

## 2015-08-10 DIAGNOSIS — M999 Biomechanical lesion, unspecified: Secondary | ICD-10-CM

## 2015-08-10 DIAGNOSIS — M9903 Segmental and somatic dysfunction of lumbar region: Secondary | ICD-10-CM | POA: Diagnosis not present

## 2015-08-10 DIAGNOSIS — M9902 Segmental and somatic dysfunction of thoracic region: Secondary | ICD-10-CM | POA: Diagnosis not present

## 2015-08-10 DIAGNOSIS — M545 Low back pain, unspecified: Secondary | ICD-10-CM

## 2015-08-10 DIAGNOSIS — M9904 Segmental and somatic dysfunction of sacral region: Secondary | ICD-10-CM

## 2015-08-10 NOTE — Assessment & Plan Note (Signed)
Decision today to treat with OMT was based on Physical Exam  After verbal consent patient was treated with HVLA, ME techniques in thoracic, lumbar and sacral areas  Patient tolerated the procedure well with improvement in symptoms  Patient given exercises, stretches and lifestyle modifications  See medications in patient instructions if given  Patient will follow up in 4-6 weeks          

## 2015-08-10 NOTE — Progress Notes (Signed)
Pre visit review using our clinic review tool, if applicable. No additional management support is needed unless otherwise documented below in the visit note. 

## 2015-08-10 NOTE — Patient Instructions (Addendum)
You have done great for the last 5 weeks! Continue to focus on the posture and the strengthening of the core. Overall I am impressed Get back to the yoga 2-3 times a week at least Lets go for 5-6 weeks.

## 2015-08-10 NOTE — Assessment & Plan Note (Signed)
Continues to do relatively well. Encourage him to continue to work on core strengthening including his yoga class. Patient will try to make these different changes and we discussed postural and ergonomic changes throughout the day. Patient and will come back and see me again in 5-6 weeks for further evaluation and treatment.

## 2015-08-10 NOTE — Progress Notes (Signed)
  [name] [place], [place] [address] Phone: [phone] Subjective:     CC: Back pain , follow-up  [id number] Lucas Young is a 59 y.o. male coming in with complaint of back pain.  Overall patient has been doing relatively well with conservative therapy. Has noticed some tightness. Has had some flares but they seem to be less frequent as well as short duration. Continues of vitamins and continues the exercises intermittently.    Past medical history, social, surgical and family history all reviewed in electronic medical record.   Review of Systems: No headache, visual changes, nausea, vomiting, diarrhea, constipation, dizziness, abdominal pain, skin rash, fevers, chills, night sweats, weight loss, swollen lymph nodes, body aches, joint swelling, muscle aches, chest pain, shortness of breath, mood changes.   Objective Blood pressure 136/84, pulse 67, height 5' 7.5" (1.715 m), weight 211 lb (95.709 kg), SpO2 94 %.  General: No apparent distress alert and oriented x3 mood and affect normal, dressed appropriately. Overweight HEENT: Pupils equal, extraocular movements intact  Respiratory: Patient's speak in full sentences and does not appear short of breath  Cardiovascular: No lower extremity edema, non tender, no erythema  Skin: Warm dry intact with no signs of infection or rash on extremities or on axial skeleton.  Abdomen: Soft nontender  Neuro: Cranial nerves II through XII are intact, neurovascularly intact in all extremities with 2+ DTRs and 2+ pulses.  Lymph: No lymphadenopathy of posterior or anterior cervical chain or axillae bilaterally.  Gait normal with good balance and coordination.  MSK:  Non tender with full range of motion and good stability and symmetric strength and tone of shoulders, elbows, wrist, hip, knee and ankles bilaterally.   Foot exam shows the patient does have mild overpronation of the hindfoot as  well as severe right and of the transverse arch bilaterally left greater than right. Patient has mild bunion and bunionette formation.  Back Exam:  Inspection: Mild increase in lordosis Motion: Flexion 45 deg, Extension 45 deg, Side Bending to 45 deg bilaterally,  Rotation to 45 deg bilaterally  SLR laying: Negative  XSLR laying: Negative  Palpable tenderness: Mild increase in tenderness over the paraspinal musculature FABER: Mark tightness from previous exam  Sensory change: Gross sensation intact to all lumbar and sacral dermatomes.  Reflexes: 2+ at both patellar tendons, 2+ at achilles tendons, Babinski's downgoing.  Strength at foot  Plantar-flexion: 5/5 Dorsi-flexion: 5/5 Eversion: 5/5 Inversion: 5/5  Leg strength  Quad: 5/5 Hamstring: 5/5 Hip flexor: 5/5 Hip abductors: 4+/5 but symmetric with no improvement from previous exam.  Gait unremarkable.  OMT Physical Exam  Cervical  Neutral  Thoracic T3 extended rotated and side bent left T5 extended rotated and side bent right T7 extended rotated and side bent left  Lumbar L1 flexed rotated and side bent left  Sacrum Right on right  Illium  Neutral     Impression and Recommendations:     This case required medical decision making of moderate complexity.

## 2015-09-21 ENCOUNTER — Ambulatory Visit (INDEPENDENT_AMBULATORY_CARE_PROVIDER_SITE_OTHER): Payer: BC Managed Care – PPO | Admitting: Family Medicine

## 2015-09-21 ENCOUNTER — Encounter: Payer: Self-pay | Admitting: Family Medicine

## 2015-09-21 VITALS — BP 136/86 | HR 64 | Ht 67.5 in | Wt 212.0 lb

## 2015-09-21 DIAGNOSIS — M9903 Segmental and somatic dysfunction of lumbar region: Secondary | ICD-10-CM | POA: Diagnosis not present

## 2015-09-21 DIAGNOSIS — M545 Low back pain, unspecified: Secondary | ICD-10-CM

## 2015-09-21 DIAGNOSIS — M9902 Segmental and somatic dysfunction of thoracic region: Secondary | ICD-10-CM | POA: Diagnosis not present

## 2015-09-21 DIAGNOSIS — M9904 Segmental and somatic dysfunction of sacral region: Secondary | ICD-10-CM | POA: Diagnosis not present

## 2015-09-21 DIAGNOSIS — M999 Biomechanical lesion, unspecified: Secondary | ICD-10-CM

## 2015-09-21 NOTE — Progress Notes (Signed)
Pre visit review using our clinic review tool, if applicable. No additional management support is needed unless otherwise documented below in the visit note. 

## 2015-09-21 NOTE — Assessment & Plan Note (Signed)
Patient continues to do relatively well. Encourage him to continue to work on core strengthening. Please see patient instructions for other advice. We discussed the manipulation seems to be working and will continue the same frequency. No other significant changes.

## 2015-09-21 NOTE — Progress Notes (Signed)
  [facility] [place], [place] [address] Phone: [phone] Subjective:     CC: Back pain , follow-up  [id number] Lucas Young is a 59 y.o. male coming in with complaint of back pain.  Overall patient has been doing relatively well with conservative therapy. Has noticed some tightness. Has had some flares but they seem to be less frequent as well as short duration. Continues of vitamins and continues the exercises intermittently. She does complain of some hamstring tightness as well as some stiffness in the morning but nothing that stops him from activity. Continues to remain active. Is getting massages 2 times a week.    Past medical history, social, surgical and family history all reviewed in electronic medical record.   Review of Systems: No headache, visual changes, nausea, vomiting, diarrhea, constipation, dizziness, abdominal pain, skin rash, fevers, chills, night sweats, weight loss, swollen lymph nodes, body aches, joint swelling, muscle aches, chest pain, shortness of breath, mood changes.   Objective Blood pressure 136/86, pulse 64, height 5' 7.5" (1.715 m), weight 212 lb (96.163 kg), SpO2 95 %.  General: No apparent distress alert and oriented x3 mood and affect normal, dressed appropriately. Overweight HEENT: Pupils equal, extraocular movements intact  Respiratory: Patient's speak in full sentences and does not appear short of breath  Cardiovascular: No lower extremity edema, non tender, no erythema  Skin: Warm dry intact with no signs of infection or rash on extremities or on axial skeleton.  Abdomen: Soft nontender  Neuro: Cranial nerves II through XII are intact, neurovascularly intact in all extremities with 2+ DTRs and 2+ pulses.  Lymph: No lymphadenopathy of posterior or anterior cervical chain or axillae bilaterally.  Gait normal with good balance and coordination.  MSK:  Non tender with full range of motion and  good stability and symmetric strength and tone of shoulders, elbows, wrist, hip, knee and ankles bilaterally.    Back Exam:  Inspection: Mild increase in lordosis Motion: Flexion 45 deg, Extension 45 deg, Side Bending to 45 deg bilaterally,  Rotation to 45 deg bilaterally  SLR laying: Negative  XSLR laying: Negative  Palpable tenderness: Decreased tenderness appears, scooter FABER: Continued tightness of the lateral aspects of the hips as well as the hamstrings bilaterally  Sensory change: Gross sensation intact to all lumbar and sacral dermatomes.  Reflexes: 2+ at both patellar tendons, 2+ at achilles tendons, Babinski's downgoing.  Strength at foot  Plantar-flexion: 5/5 Dorsi-flexion: 5/5 Eversion: 5/5 Inversion: 5/5  Leg strength  Quad: 5/5 Hamstring: 5/5 Hip flexor: 5/5 Hip abductors: 4+/5 but symmetric with no improvement from previous exam.  Gait unremarkable.  OMT Physical Exam  Cervical  C2 flexed rotated and side bent right C4 flexed rotated and side bent left  Thoracic T3 extended rotated and side bent left T5 extended rotated and side bent right T7 extended rotated and side bent left  Lumbar L1 flexed rotated and side bent left  Sacrum Right on right  Illium  Neutral  Impression pattern as previously   Impression and Recommendations:     This case required medical decision making of moderate complexity.

## 2015-09-21 NOTE — Assessment & Plan Note (Signed)
Decision today to treat with OMT was based on Physical Exam  After verbal consent patient was treated with HVLA, ME techniques in thoracic, lumbar and sacral areas  Patient tolerated the procedure well with improvement in symptoms  Patient given exercises, stretches and lifestyle modifications  See medications in patient instructions if given  Patient will follow up in 4-6 weeks          

## 2015-09-21 NOTE — Patient Instructions (Addendum)
Great to see you as always.  I am impressed  Continue what you are doing. I think your Yoga and the core are key for you.  New exercises for the hamstrings.  Nothing much new.  Return 6-7 weeks.

## 2015-11-02 ENCOUNTER — Encounter: Payer: Self-pay | Admitting: Family Medicine

## 2015-11-02 ENCOUNTER — Ambulatory Visit (INDEPENDENT_AMBULATORY_CARE_PROVIDER_SITE_OTHER): Payer: BC Managed Care – PPO | Admitting: Family Medicine

## 2015-11-02 VITALS — BP 142/88 | HR 71 | Ht 67.5 in | Wt 217.0 lb

## 2015-11-02 DIAGNOSIS — M9903 Segmental and somatic dysfunction of lumbar region: Secondary | ICD-10-CM

## 2015-11-02 DIAGNOSIS — M9904 Segmental and somatic dysfunction of sacral region: Secondary | ICD-10-CM | POA: Diagnosis not present

## 2015-11-02 DIAGNOSIS — M999 Biomechanical lesion, unspecified: Secondary | ICD-10-CM

## 2015-11-02 DIAGNOSIS — M9902 Segmental and somatic dysfunction of thoracic region: Secondary | ICD-10-CM | POA: Diagnosis not present

## 2015-11-02 DIAGNOSIS — M545 Low back pain, unspecified: Secondary | ICD-10-CM

## 2015-11-02 NOTE — Assessment & Plan Note (Signed)
Decision today to treat with OMT was based on Physical Exam  After verbal consent patient was treated with HVLA, ME techniques in thoracic, lumbar and sacral areas  Patient tolerated the procedure well with improvement in symptoms  Patient given exercises, stretches and lifestyle modifications  See medications in patient instructions if given  Patient will follow up in 6-8 weeks    

## 2015-11-02 NOTE — Patient Instructions (Signed)
Great to see you Happy holidays!  Keep doing what you are doing.  Posture is key keep posture and think about posture with sitting at computer.  Try the Y-T-A exercises daily or most days of the week.  See me again in around 6-7 weeks!

## 2015-11-02 NOTE — Progress Notes (Signed)
  [facility] [place], [place] [address] Phone: [phone] Subjective:     CC: Back pain , follow-up  [id number] Lucas Young is a 59 y.o. male coming in with complaint of back pain. Patient does have chronic back pain likely secondary to poor core strength as well as muscle imbalances. Patient has been doing very well with conservative therapy. Patient has been doing yoga, home exercises, wearing appropriate shoes, natural supplementations. Patient does respond well to osteopathic manipulation. Patient states overall he has been doing relatively well. Had one time when he slipped and had to catch himself causing some mild low back pain that seems to radiate up towards his neck. Nothing that is stopping him from activity. Seems to be little tighter. Not as consistent with his exercise program. No new symptoms. Would consider himself somewhat worse than previous exam.   Past medical history, social, surgical and family history all reviewed in electronic medical record.   Review of Systems: No headache, visual changes, nausea, vomiting, diarrhea, constipation, dizziness, abdominal pain, skin rash, fevers, chills, night sweats, weight loss, swollen lymph nodes, body aches, joint swelling, muscle aches, chest pain, shortness of breath, mood changes.   Objective Blood pressure 142/88, pulse 71, height 5' 7.5" (1.715 m), weight 217 lb (98.431 kg), SpO2 95 %.  General: No apparent distress alert and oriented x3 mood and affect normal, dressed appropriately. Overweight HEENT: Pupils equal, extraocular movements intact  Respiratory: Patient's speak in full sentences and does not appear short of breath  Cardiovascular: No lower extremity edema, non tender, no erythema  Skin: Warm dry intact with no signs of infection or rash on extremities or on axial skeleton.  Abdomen: Soft nontender  Neuro: Cranial nerves II through XII are intact,  neurovascularly intact in all extremities with 2+ DTRs and 2+ pulses.  Lymph: No lymphadenopathy of posterior or anterior cervical chain or axillae bilaterally.  Gait normal with good balance and coordination.  MSK:  Non tender with full range of motion and good stability and symmetric strength and tone of shoulders, elbows, wrist, hip, knee and ankles bilaterally.    Back Exam:  Inspection: Mild increase in lordosis Motion: Flexion 45 deg, Extension 45 deg, Side Bending to 45 deg bilaterally,  Rotation to 45 deg bilaterally  SLR laying: Negative  XSLR laying: Negative  Palpable tenderness: Decreased tenderness appears, scooter FABER: Continued tightness of the lateral aspects of the hips as well as the hamstrings bilaterally  Sensory change: Gross sensation intact to all lumbar and sacral dermatomes.  Reflexes: 2+ at both patellar tendons, 2+ at achilles tendons, Babinski's downgoing.  Strength at foot  Plantar-flexion: 5/5 Dorsi-flexion: 5/5 Eversion: 5/5 Inversion: 5/5  Leg strength  Quad: 5/5 Hamstring: 5/5 Hip flexor: 5/5 Hip abductors: 4+/5 but symmetric with no improvement from previous exam.  Gait unremarkable.  OMT Physical Exam  Cervical  C2 flexed rotated and side bent right   Thoracic T3 extended rotated and side bent left T5 extended rotated and side bent right T7 extended rotated and side bent left T9 extended rotated and side bent right  Lumbar L1 flexed rotated and side bent left L4 flexed rotated and side bent right  Sacrum Right on right  Illium  Neutral     Impression and Recommendations:     This case required medical decision making of moderate complexity.

## 2015-11-02 NOTE — Assessment & Plan Note (Signed)
Overall patient is doing relatively well. We discussed home when patient setback is likely secondary to him becoming a little passive on his exercises. Encourage more postural exercises and showed proper form today. We discussed continuing the supplementations. Continue the strengthening of the core. [place] these changes. No prescriptions needed this time. Discussed ergonomics of the day. Patient come back in 6-7 weeks.

## 2015-11-02 NOTE — Progress Notes (Signed)
Pre visit review using our clinic review tool, if applicable. No additional management support is needed unless otherwise documented below in the visit note. 

## 2015-12-21 ENCOUNTER — Ambulatory Visit (INDEPENDENT_AMBULATORY_CARE_PROVIDER_SITE_OTHER): Payer: BC Managed Care – PPO | Admitting: Family Medicine

## 2015-12-21 ENCOUNTER — Encounter: Payer: Self-pay | Admitting: Family Medicine

## 2015-12-21 VITALS — BP 142/84 | HR 76 | Wt 215.0 lb

## 2015-12-21 DIAGNOSIS — M9902 Segmental and somatic dysfunction of thoracic region: Secondary | ICD-10-CM | POA: Diagnosis not present

## 2015-12-21 DIAGNOSIS — M999 Biomechanical lesion, unspecified: Secondary | ICD-10-CM

## 2015-12-21 DIAGNOSIS — M9904 Segmental and somatic dysfunction of sacral region: Secondary | ICD-10-CM | POA: Diagnosis not present

## 2015-12-21 DIAGNOSIS — M9903 Segmental and somatic dysfunction of lumbar region: Secondary | ICD-10-CM

## 2015-12-21 DIAGNOSIS — M533 Sacrococcygeal disorders, not elsewhere classified: Secondary | ICD-10-CM | POA: Diagnosis not present

## 2015-12-21 NOTE — Assessment & Plan Note (Signed)
Seconds visit in a rower patient has had severe difficulty with the sacroiliac joint. I do believe the patient is having more of a definitive dysfunction secondary to the muscle imbalances. We discussed different core exercises and range of motion exercises. We discussed icing regimen. We discussed proper shoes. Patient will come back and see me again in 4-6 weeks for further evaluation and treatment.

## 2015-12-21 NOTE — Progress Notes (Signed)
  [name] [place], [place] [address] Phone: [phone] Subjective:     CC: Back pain , follow-up  [id number] Saman Chrzanowski is a 60 y.o. male coming in with complaint of back pain. Patient does have chronic back pain likely secondary to poor core strength as well as muscle imbalances.   had been doing very well. Patient states of the holidays though he was not working out as much and was not doing the exercises. Patient also recently had a slip on the water where he caught himself. Patient states since then his left sacroiliac joint seems to be hurting him more. Describes it as dull, throbbing aching sensation. Denies any numbness. Denies any radiation down the leg. Can be uncomfortable with certain movements. Nothing that stopping him from daily activities but does feel more tight. Patient is also started a new workout routine that he thinks could be contributing as well.  Past medical history, social, surgical and family history all reviewed in electronic medical record.   Review of Systems: No headache, visual changes, nausea, vomiting, diarrhea, constipation, dizziness, abdominal pain, skin rash, fevers, chills, night sweats, weight loss, swollen lymph nodes, body aches, joint swelling, muscle aches, chest pain, shortness of breath, mood changes.   Objective Blood pressure 142/84, pulse 76, weight 215 lb (97.523 kg).  General: No apparent distress alert and oriented x3 mood and affect normal, dressed appropriately. Overweight HEENT: Pupils equal, extraocular movements intact  Respiratory: Patient's speak in full sentences and does not appear short of breath  Cardiovascular: No lower extremity edema, non tender, no erythema  Skin: Warm dry intact with no signs of infection or rash on extremities or on axial skeleton.  Abdomen: Soft nontender  Neuro: Cranial nerves II through XII are intact, neurovascularly intact in all  extremities with 2+ DTRs and 2+ pulses.  Lymph: No lymphadenopathy of posterior or anterior cervical chain or axillae bilaterally.  Gait normal with good balance and coordination.  MSK:  Non tender with full range of motion and good stability and symmetric strength and tone of shoulders, elbows, wrist, hip, knee and ankles bilaterally.    Back Exam:  Inspection: Mild increase in lordosis Motion: Flexion 45 deg, Extension 45 deg, Side Bending to 45 deg bilaterally,  Rotation to 45 deg bilaterally  SLR laying: Negative  XSLR laying: Negative  Palpable tenderness: DMild tightness mostly over the left sacroiliac joint FABER:  Positive on left side  Sensory change: Gross sensation intact to all lumbar and sacral dermatomes.  Reflexes: 2+ at both patellar tendons, 2+ at achilles tendons, Babinski's downgoing.  Strength at foot  Plantar-flexion: 5/5 Dorsi-flexion: 5/5 Eversion: 5/5 Inversion: 5/5  Leg strength  Quad: 5/5 Hamstring: 5/5 Hip flexor: 5/5 Hip abductors: 4+/5 but symmetric with no improvement from previous exam.  Gait unremarkable.  OMT Physical Exam  Cervical  C2 flexed rotated and side bent right   Thoracic T3 extended rotated and side bent left T7 extended rotated and side bent left T9 extended rotated and side bent right  Lumbar L2 flexed rotated and side bent left L4 flexed rotated and side bent right  Sacrum Right on right  Illium  Right posterior ilium    Impression and Recommendations:     This case required medical decision making of moderate complexity.

## 2015-12-21 NOTE — Assessment & Plan Note (Signed)
Decision today to treat with OMT was based on Physical Exam  After verbal consent patient was treated with HVLA, ME techniques in thoracic, lumbar and sacral areas  Patient tolerated the procedure well with improvement in symptoms  Patient given exercises, stretches and lifestyle modifications  See medications in patient instructions if given  Patient will follow up in 4-6 weeks          

## 2015-12-21 NOTE — Patient Instructions (Signed)
Good to see you as always Stay active and I know you will get a little of that weight off, stay motivated.  Keep working on the core, posture and just stay active See me again in 5-6 weeks.

## 2016-01-25 ENCOUNTER — Ambulatory Visit: Payer: BC Managed Care – PPO | Admitting: Family Medicine

## 2016-02-03 ENCOUNTER — Ambulatory Visit (INDEPENDENT_AMBULATORY_CARE_PROVIDER_SITE_OTHER): Payer: BC Managed Care – PPO | Admitting: Family Medicine

## 2016-02-03 ENCOUNTER — Encounter: Payer: Self-pay | Admitting: Family Medicine

## 2016-02-03 VITALS — BP 124/84 | HR 82 | Ht 67.5 in | Wt 215.0 lb

## 2016-02-03 DIAGNOSIS — M545 Low back pain, unspecified: Secondary | ICD-10-CM

## 2016-02-03 DIAGNOSIS — M9904 Segmental and somatic dysfunction of sacral region: Secondary | ICD-10-CM

## 2016-02-03 DIAGNOSIS — M999 Biomechanical lesion, unspecified: Secondary | ICD-10-CM

## 2016-02-03 DIAGNOSIS — M9903 Segmental and somatic dysfunction of lumbar region: Secondary | ICD-10-CM | POA: Diagnosis not present

## 2016-02-03 DIAGNOSIS — M9902 Segmental and somatic dysfunction of thoracic region: Secondary | ICD-10-CM | POA: Diagnosis not present

## 2016-02-03 NOTE — Progress Notes (Signed)
Pre visit review using our clinic review tool, if applicable. No additional management support is needed unless otherwise documented below in the visit note. 

## 2016-02-03 NOTE — Progress Notes (Signed)
[place], [place] [address] Phone: [phone] Subjective:     CC: Back pain , follow-up  [id number] Lucas Young is a 60 y.o. male coming in with complaint of back pain. Patient does have chronic back pain likely secondary to poor core strength as well as muscle imbalances.   patient has been doing very well overall. Patient is not having as much pain anymore. As long as he does his exercises and stretches he has noticed significant improvement. Not having much back pain and only some mild tightness of the hamstrings bilaterally. Patient states as long as he does the exercises it does not seem to be as painful.  Past Medical History  Diagnosis Date  . Allergy    Past Surgical History  Procedure Laterality Date  . Appendectomy     Social History  Substance Use Topics  . Smoking status: Never Smoker   . Smokeless tobacco: None  . Alcohol Use: No   No Known Allergies Family History  Problem Relation Age of Onset  . Hypertension Mother   . Hypertension Father   . Hyperlipidemia Father   . Hypertension Maternal Grandmother   . Hypertension Maternal Grandfather      Past medical history, social, surgical and family history all reviewed in electronic medical record.   Review of Systems: No headache, visual changes, nausea, vomiting, diarrhea, constipation, dizziness, abdominal pain, skin rash, fevers, chills, night sweats, weight loss, swollen lymph nodes, body aches, joint swelling, muscle aches, chest pain, shortness of breath, mood changes.   Objective Blood pressure 124/84, pulse 82, height 5' 7.5" (1.715 m), weight 215 lb (97.523 kg).  General: No apparent distress alert and oriented x3 mood and affect normal, dressed appropriately. Overweight HEENT: Pupils equal, extraocular movements intact  Respiratory: Patient's speak in full sentences and does not appear short of breath  Cardiovascular: No lower  extremity edema, non tender, no erythema  Skin: Warm dry intact with no signs of infection or rash on extremities or on axial skeleton.  Abdomen: Soft nontender  Neuro: Cranial nerves II through XII are intact, neurovascularly intact in all extremities with 2+ DTRs and 2+ pulses.  Lymph: No lymphadenopathy of posterior or anterior cervical chain or axillae bilaterally.  Gait normal with good balance and coordination.  MSK:  Non tender with full range of motion and good stability and symmetric strength and tone of shoulders, elbows, wrist, hip, knee and ankles bilaterally.    Back Exam:  Inspection: Mild increase in lordosis Motion: Flexion 45 deg, Extension 35 deg, Side Bending to 45 deg bilaterally,  Rotation to 45 deg bilaterally  SLR laying: Negative  XSLR laying: Negative  Palpable tenderness:  Nontender today FABER:  Positive on left side  Sensory change: Gross sensation intact to all lumbar and sacral dermatomes.  Reflexes: 2+ at both patellar tendons, 2+ at achilles tendons, Babinski's downgoing.  Strength at foot  Plantar-flexion: 5/5 Dorsi-flexion: 5/5 Eversion: 5/5 Inversion: 5/5  Leg strength  Quad: 5/5 Hamstring: 5/5 Hip flexor: 5/5 Hip abductors: 4+/5 but symmetric with no improvement from previous exam.  Gait unremarkable. Continued tightness of the hamstrings bilaterally  OMT Physical Exam  Cervical  C2 flexed rotated and side bent right   Thoracic T3 extended rotated and side bent left T9 extended rotated and side bent right  Lumbar L2 flexed rotated and side bent left L3 flexed rotated and side bent right  Sacrum Right on right  Illium  Right  posterior ilium    Impression and Recommendations:     This case required medical decision making of moderate complexity.

## 2016-02-03 NOTE — Patient Instructions (Addendum)
Good to see you  See again in 8-12 weeks

## 2016-02-03 NOTE — Assessment & Plan Note (Signed)
Patient has made great strides overall. I do not want to make any significant changes in medical management at this time. Continues respond very well to osteopathic manipulation. Patient given different ergonomics throughout the day as well as eccentric exercises for the tight hamstrings. Patient will work on hip abductor strengthening. Patient come back and see me again in 8-12 weeks for further evaluation and treatment.

## 2016-02-03 NOTE — Assessment & Plan Note (Signed)
Decision today to treat with OMT was based on Physical Exam  After verbal consent patient was treated with HVLA, ME techniques in thoracic, lumbar and sacral areas  Patient tolerated the procedure well with improvement in symptoms  Patient given exercises, stretches and lifestyle modifications  See medications in patient instructions if given  Patient will follow up in 8-12 weeks

## 2016-04-06 ENCOUNTER — Ambulatory Visit (INDEPENDENT_AMBULATORY_CARE_PROVIDER_SITE_OTHER): Payer: BC Managed Care – PPO | Admitting: Family Medicine

## 2016-04-06 ENCOUNTER — Encounter: Payer: Self-pay | Admitting: Family Medicine

## 2016-04-06 VITALS — BP 140/94 | HR 67 | Ht 67.5 in | Wt 214.0 lb

## 2016-04-06 DIAGNOSIS — M999 Biomechanical lesion, unspecified: Secondary | ICD-10-CM

## 2016-04-06 DIAGNOSIS — M9903 Segmental and somatic dysfunction of lumbar region: Secondary | ICD-10-CM

## 2016-04-06 DIAGNOSIS — M533 Sacrococcygeal disorders, not elsewhere classified: Secondary | ICD-10-CM | POA: Diagnosis not present

## 2016-04-06 DIAGNOSIS — M9902 Segmental and somatic dysfunction of thoracic region: Secondary | ICD-10-CM | POA: Diagnosis not present

## 2016-04-06 DIAGNOSIS — M9904 Segmental and somatic dysfunction of sacral region: Secondary | ICD-10-CM | POA: Diagnosis not present

## 2016-04-06 NOTE — Assessment & Plan Note (Signed)
Increased tightness from previous exam. Encourage patient to continue to monitor back spasms. Declined any type of medication today. We discussed icing regimen. Discussed home exercises getting greater detail. Patient does not want a repeat formal physical therapy at the moment. Likely to secondary distress eating. Follow-up again in 8 weeks for further evaluation and treatment.

## 2016-04-06 NOTE — Assessment & Plan Note (Signed)
Decision today to treat with OMT was based on Physical Exam  After verbal consent patient was treated with HVLA, ME techniques in thoracic, lumbar and sacral areas  Patient tolerated the procedure well with improvement in symptoms  Patient given exercises, stretches and lifestyle modifications  See medications in patient instructions if given  Patient will follow up in 8 weeks

## 2016-04-06 NOTE — Progress Notes (Signed)
[facility] [place], [place] [address] Phone: [phone] Subjective:     CC: Back pain , follow-up  [id number] Lucas Young is a 60 y.o. male coming in with complaint of back pain. Patient does have chronic back pain likely secondary to poor core strength as well as muscle imbalances.  Heads more stress recently. Patient has been sitting on a more frequent basis and not doing the yoga classes. Patient taking the vitamins fairly regularly. Has noticed some more tightness in the back. Has been doing yard work and also seem to make more tightness. Heat seems to help more than ice. Minorly worsen previous exam he states.  Past Medical History  Diagnosis Date  . Allergy    Past Surgical History  Procedure Laterality Date  . Appendectomy     Social History  Substance Use Topics  . Smoking status: Never Smoker   . Smokeless tobacco: None  . Alcohol Use: No   No Known Allergies Family History  Problem Relation Age of Onset  . Hypertension Mother   . Hypertension Father   . Hyperlipidemia Father   . Hypertension Maternal Grandmother   . Hypertension Maternal Grandfather      Past medical history, social, surgical and family history all reviewed in electronic medical record.   Review of Systems: No headache, visual changes, nausea, vomiting, diarrhea, constipation, dizziness, abdominal pain, skin rash, fevers, chills, night sweats, weight loss, swollen lymph nodes, body aches, joint swelling, muscle aches, chest pain, shortness of breath, mood changes.   Objective Blood pressure 140/94, pulse 67, height 5' 7.5" (1.715 m), weight 214 lb (97.07 kg), SpO2 97 %.  General: No apparent distress alert and oriented x3 mood and affect normal, dressed appropriately. Overweight HEENT: Pupils equal, extraocular movements intact  Respiratory: Patient's speak in full sentences and does not appear short of breath  Cardiovascular: No  lower extremity edema, non tender, no erythema  Skin: Warm dry intact with no signs of infection or rash on extremities or on axial skeleton.  Abdomen: Soft nontender  Neuro: Cranial nerves II through XII are intact, neurovascularly intact in all extremities with 2+ DTRs and 2+ pulses.  Lymph: No lymphadenopathy of posterior or anterior cervical chain or axillae bilaterally.  Gait normal with good balance and coordination.  MSK:  Non tender with full range of motion and good stability and symmetric strength and tone of shoulders, elbows, wrist, hip, knee and ankles bilaterally.    Back Exam:  Inspection: Mild increase in lordosis Motion: Flexion 43 deg, Extension 25 deg, Side Bending to 45 deg bilaterally,  Rotation to 45 deg bilaterally  SLR laying: Negative  XSLR laying: Negative  Palpable tenderness:  Significant increase in tenderness of the thoracolumbar junction paraspinal musculature FABER:  Positive on left side  Sensory change: Gross sensation intact to all lumbar and sacral dermatomes.  Reflexes: 2+ at both patellar tendons, 2+ at achilles tendons, Babinski's downgoing.  Strength at foot  Plantar-flexion: 5/5 Dorsi-flexion: 5/5 Eversion: 5/5 Inversion: 5/5  Leg strength  Quad: 5/5 Hamstring: 5/5 Hip flexor: 5/5 Hip abductors: 4+/5 but symmetric with no improvement from previous exam.  Gait unremarkable. Continued tightness of the hamstrings bilaterally  OMT Physical Exam  Cervical  C2 flexed rotated and side bent right   Thoracic T3 extended rotated and side bent left T7 extended rotated and side bent right T12 extended rotated and side bent left Lumbar L2 flexed rotated and side bent left L4 flexed  rotated and side bent right  Sacrum Right on right  Illium  Neutral    Impression and Recommendations:     This case required medical decision making of moderate complexity.

## 2016-04-06 NOTE — Patient Instructions (Signed)
Good to see you  Just the time of year... Add tart cherry extract any dose at night  Consider checking your blood pressure 2 times a week to make sure it comes down.  Ice is your friend Continue with the back yoga it is doing well and focus on the hip flexor stretching.  Continue to see me 8 weeks.

## 2016-04-06 NOTE — Progress Notes (Signed)
Pre visit review using our clinic review tool, if applicable. No additional management support is needed unless otherwise documented below in the visit note. 

## 2016-06-01 ENCOUNTER — Ambulatory Visit (INDEPENDENT_AMBULATORY_CARE_PROVIDER_SITE_OTHER): Payer: BC Managed Care – PPO | Admitting: Family Medicine

## 2016-06-01 ENCOUNTER — Encounter: Payer: Self-pay | Admitting: Family Medicine

## 2016-06-01 VITALS — BP 140/86 | HR 63 | Ht 67.5 in | Wt 218.0 lb

## 2016-06-01 DIAGNOSIS — M533 Sacrococcygeal disorders, not elsewhere classified: Secondary | ICD-10-CM

## 2016-06-01 DIAGNOSIS — M999 Biomechanical lesion, unspecified: Secondary | ICD-10-CM

## 2016-06-01 DIAGNOSIS — M9902 Segmental and somatic dysfunction of thoracic region: Secondary | ICD-10-CM | POA: Diagnosis not present

## 2016-06-01 DIAGNOSIS — M9904 Segmental and somatic dysfunction of sacral region: Secondary | ICD-10-CM

## 2016-06-01 DIAGNOSIS — M9903 Segmental and somatic dysfunction of lumbar region: Secondary | ICD-10-CM | POA: Diagnosis not present

## 2016-06-01 NOTE — Progress Notes (Signed)
[facility] [place], [place] [address] Phone: [phone] Subjective:     CC: Back pain , follow-up  [id number] Lucas Young is a 60 y.o. male coming in with complaint of back pain. Patient does have chronic back pain likely secondary to poor core strength as well as muscle imbalances.  At last exam patient was having more of an exacerbation. Has not been doing exercise classes no regular basis like he was doing previously. Patient states overall he is doing better. Patient's continues to not be able to do his exercises regularly. Patient denies any numbness. States overall seems to be doing relatively well.  Past Medical History  Diagnosis Date  . Allergy    Past Surgical History  Procedure Laterality Date  . Appendectomy     Social History  Substance Use Topics  . Smoking status: Never Smoker   . Smokeless tobacco: None  . Alcohol Use: No   No Known Allergies Family History  Problem Relation Age of Onset  . Hypertension Mother   . Hypertension Father   . Hyperlipidemia Father   . Hypertension Maternal Grandmother   . Hypertension Maternal Grandfather      Past medical history, social, surgical and family history all reviewed in electronic medical record.   Review of Systems: No headache, visual changes, nausea, vomiting, diarrhea, constipation, dizziness, abdominal pain, skin rash, fevers, chills, night sweats, weight loss, swollen lymph nodes, body aches, joint swelling, muscle aches, chest pain, shortness of breath, mood changes.   Objective Blood pressure 140/86, pulse 63, height 5' 7.5" (1.715 m), weight 218 lb (98.884 kg), SpO2 95 %.  General: No apparent distress alert and oriented x3 mood and affect normal, dressed appropriately. Overweight HEENT: Pupils equal, extraocular movements intact  Respiratory: Patient's speak in full sentences and does not appear short of breath  Cardiovascular: No lower  extremity edema, non tender, no erythema  Skin: Warm dry intact with no signs of infection or rash on extremities or on axial skeleton.  Abdomen: Soft nontender  Neuro: Cranial nerves II through XII are intact, neurovascularly intact in all extremities with 2+ DTRs and 2+ pulses.  Lymph: No lymphadenopathy of posterior or anterior cervical chain or axillae bilaterally.  Gait normal with good balance and coordination.  MSK:  Non tender with full range of motion and good stability and symmetric strength and tone of shoulders, elbows, wrist, hip, knee and ankles bilaterally.    Back Exam:  Inspection: Mild increase in lordosis Motion: Flexion 43 deg, Extension 25 deg, Side Bending to 45 deg bilaterally,  Rotation to 45 deg bilaterally  SLR laying: Negative  XSLR laying: Negative  Palpable tenderness:  Significant increase in tenderness of the thoracolumbar junction paraspinal musculature FABER:  Positive on left side  Sensory change: Gross sensation intact to all lumbar and sacral dermatomes.  Reflexes: 2+ at both patellar tendons, 2+ at achilles tendons, Babinski's downgoing.  Strength at foot  Plantar-flexion: 5/5 Dorsi-flexion: 5/5 Eversion: 5/5 Inversion: 5/5  Leg strength  Quad: 5/5 Hamstring: 5/5 Hip flexor: 5/5 Hip abductors: 4+/5 but symmetric with no improvement from previous exam.  Gait unremarkable. Mild improvement in the tightness of the hamstrings  OMT Physical Exam  Cervical  C2 flexed rotated and side bent right C4 flexed rotated and side bent left  Thoracic T3 extended rotated and side bent left T7 extended rotated and side bent right T12 extended rotated and side bent left Lumbar L2 flexed rotated and side  bent left L4 flexed rotated and side bent right  Sacrum Right on right  Illium  Neutral    Impression and Recommendations:     This case required medical decision making of moderate complexity.

## 2016-06-01 NOTE — Assessment & Plan Note (Signed)
Decision today to treat with OMT was based on Physical Exam  After verbal consent patient was treated with HVLA, ME techniques in thoracic, lumbar and sacral areas  Patient tolerated the procedure well with improvement in symptoms  Patient given exercises, stretches and lifestyle modifications  See medications in patient instructions if given  Patient will follow up in 8-10 weeks      

## 2016-06-01 NOTE — Progress Notes (Signed)
Pre visit review using our clinic review tool, if applicable. No additional management support is needed unless otherwise documented below in the visit note. 

## 2016-06-01 NOTE — Patient Instructions (Addendum)
Good to see you  Ice is your friend if you need it.  Stay active and I do feel that the Yoga has been helpful.  Consider adding some planks, working up to 1 minute holds 3 times when doing your core strengthening days.  When on your feet a lot good shoes are key  See me again in 8-10 weeks.

## 2016-06-01 NOTE — Assessment & Plan Note (Signed)
Patient overall has been doing relatively well. I do not feel any significant change in management is necessary at this time. We discussed with patient about proper shoes, home exercises, core strengthening discussed isometrics that could be helpful. Patient and will follow-up with me again 8-10 weeks

## 2016-07-16 NOTE — Assessment & Plan Note (Deleted)
Overall doing well, still have some core strengthening to do.  Discussed icing and HEP. Discussed ergonomics Started teaching again.  RTC in 8-12 weeks.

## 2016-07-16 NOTE — Assessment & Plan Note (Deleted)
Decision today to treat with OMT was based on Physical Exam  After verbal consent patient was treated with HVLA, ME techniques in thoracic, lumbar and sacral areas  Patient tolerated the procedure well with improvement in symptoms  Patient given exercises, stretches and lifestyle modifications  See medications in patient instructions if given  Patient will follow up in 8-12 weeks

## 2016-07-16 NOTE — Progress Notes (Deleted)
[name] [place], [place] [address] Phone: [phone] Subjective:     CC: Back pain , follow-up  [id number]  Lucas Young is a 60 y.o. male coming in with complaint of back pain. Patient does have chronic back pain likely secondary to poor core strength as well as muscle imbalances.  At last exam patient was having more of an exacerbation. Has not been doing exercise classes no regular basis like he was doing previously. Patient states overall he is doing better. Patient's continues to not be able to do his exercises regularly. Patient denies any numbness. States overall seems to be doing relatively well.  Past Medical History:  Diagnosis Date  . Allergy    Past Surgical History:  Procedure Laterality Date  . APPENDECTOMY     Social History  Substance Use Topics  . Smoking status: Never Smoker  . Smokeless tobacco: Not on file  . Alcohol use No   No Known Allergies Family History  Problem Relation Age of Onset  . Hypertension Mother   . Hypertension Father   . Hyperlipidemia Father   . Hypertension Maternal Grandmother   . Hypertension Maternal Grandfather      Past medical history, social, surgical and family history all reviewed in electronic medical record.   Review of Systems: No headache, visual changes, nausea, vomiting, diarrhea, constipation, dizziness, abdominal pain, skin rash, fevers, chills, night sweats, weight loss, swollen lymph nodes, body aches, joint swelling, muscle aches, chest pain, shortness of breath, mood changes.   Objective  There were no vitals taken for this visit.  General: No apparent distress alert and oriented x3 mood and affect normal, dressed appropriately. Overweight HEENT: Pupils equal, extraocular movements intact  Respiratory: Patient's speak in full sentences and does not appear short of breath  Cardiovascular: No lower extremity edema, non tender, no erythema  Skin:  Warm dry intact with no signs of infection or rash on extremities or on axial skeleton.  Abdomen: Soft nontender  Neuro: Cranial nerves II through XII are intact, neurovascularly intact in all extremities with 2+ DTRs and 2+ pulses.  Lymph: No lymphadenopathy of posterior or anterior cervical chain or axillae bilaterally.  Gait normal with good balance and coordination.  MSK:  Non tender with full range of motion and good stability and symmetric strength and tone of shoulders, elbows, wrist, hip, knee and ankles bilaterally.    Back Exam:  Inspection: Mild increase in lordosis Motion: Flexion 43 deg, Extension 25 deg, Side Bending to 45 deg bilaterally,  Rotation to 45 deg bilaterally  SLR laying: Negative  XSLR laying: Negative  Palpable tenderness:  Significant increase in tenderness of the thoracolumbar junction paraspinal musculature FABER:  Positive on left side  Sensory change: Gross sensation intact to all lumbar and sacral dermatomes.  Reflexes: 2+ at both patellar tendons, 2+ at achilles tendons, Babinski's downgoing.  Strength at foot  Plantar-flexion: 5/5 Dorsi-flexion: 5/5 Eversion: 5/5 Inversion: 5/5  Leg strength  Quad: 5/5 Hamstring: 5/5 Hip flexor: 5/5 Hip abductors: 4+/5 but symmetric with no improvement from previous exam.  Gait unremarkable. Mild improvement in the tightness of the hamstrings  OMT Physical Exam  Cervical  C2 flexed rotated and side bent right C4 flexed rotated and side bent left  Thoracic T3 extended rotated and side bent left T7 extended rotated and side bent right T12 extended rotated and side bent left Lumbar L2 flexed rotated and side bent left L4 flexed rotated and side  bent right  Sacrum Right on right  Illium  Neutral    Impression and Recommendations:     This case required medical decision making of moderate complexity.

## 2016-07-18 ENCOUNTER — Ambulatory Visit: Payer: BC Managed Care – PPO | Admitting: Family Medicine

## 2016-07-21 ENCOUNTER — Encounter: Payer: Self-pay | Admitting: Family Medicine

## 2016-07-21 ENCOUNTER — Ambulatory Visit (INDEPENDENT_AMBULATORY_CARE_PROVIDER_SITE_OTHER): Payer: BC Managed Care – PPO | Admitting: Family Medicine

## 2016-07-21 VITALS — BP 136/84 | HR 51 | Wt 215.0 lb

## 2016-07-21 DIAGNOSIS — M9903 Segmental and somatic dysfunction of lumbar region: Secondary | ICD-10-CM | POA: Diagnosis not present

## 2016-07-21 DIAGNOSIS — M533 Sacrococcygeal disorders, not elsewhere classified: Secondary | ICD-10-CM | POA: Diagnosis not present

## 2016-07-21 DIAGNOSIS — M999 Biomechanical lesion, unspecified: Secondary | ICD-10-CM

## 2016-07-21 DIAGNOSIS — M9904 Segmental and somatic dysfunction of sacral region: Secondary | ICD-10-CM | POA: Diagnosis not present

## 2016-07-21 DIAGNOSIS — M9902 Segmental and somatic dysfunction of thoracic region: Secondary | ICD-10-CM | POA: Diagnosis not present

## 2016-07-21 NOTE — Progress Notes (Signed)
[name] [place], [place] [address] Phone: [phone] Subjective:     CC: Back pain , follow-up  [id number]  Lucas Young is a 60 y.o. male coming in with complaint of back pain. Patient does have chronic back pain likely secondary to poor core strength as well as muscle imbalances.  Patient is having worsening pain of the right lower back. Patient recently did have a stomach illness. Thinks that this could've contributed to it. No radiation of pain seems to stay localized. Worse and has been in months though.  Past Medical History:  Diagnosis Date  . Allergy    Past Surgical History:  Procedure Laterality Date  . APPENDECTOMY     Social History  Substance Use Topics  . Smoking status: Never Smoker  . Smokeless tobacco: Not on file  . Alcohol use No   No Known Allergies Family History  Problem Relation Age of Onset  . Hypertension Mother   . Hypertension Father   . Hyperlipidemia Father   . Hypertension Maternal Grandmother   . Hypertension Maternal Grandfather      Past medical history, social, surgical and family history all reviewed in electronic medical record.   Review of Systems: No headache, visual changes, nausea, vomiting, diarrhea, constipation, dizziness, abdominal pain, skin rash, fevers, chills, night sweats, weight loss, swollen lymph nodes,  chest pain, shortness of breath, mood changes.   Objective  Blood pressure 136/84, pulse (!) 51, weight 215 lb (97.5 kg), SpO2 94 %.  General: No apparent distress alert and oriented x3 mood and affect normal, dressed appropriately. Overweight HEENT: Pupils equal, extraocular movements intact  Respiratory: Patient's speak in full sentences and does not appear short of breath  Cardiovascular: No lower extremity edema, non tender, no erythema  Skin: Warm dry intact with no signs of infection or rash on extremities or on axial skeleton.  Abdomen: Soft  nontender  Neuro: Cranial nerves II through XII are intact, neurovascularly intact in all extremities with 2+ DTRs and 2+ pulses.  Lymph: No lymphadenopathy of posterior or anterior cervical chain or axillae bilaterally.  Gait normal with good balance and coordination.  MSK:  Non tender with full range of motion and good stability and symmetric strength and tone of shoulders, elbows, wrist, hip, knee and ankles bilaterally.    Back Exam:  Inspection: Mild increase in lordosis Motion: Flexion 43 deg, Extension 25 deg, Side Bending to 45 deg bilaterally,  Rotation to 45 deg bilaterally  SLR laying: Negative  XSLR laying: Negative  Palpable tenderness: More tenderness over the right sacroiliac joint FABER:  Positive right which is new  Sensory change: Gross sensation intact to all lumbar and sacral dermatomes.  Reflexes: 2+ at both patellar tendons, 2+ at achilles tendons, Babinski's downgoing.  Strength at foot  Plantar-flexion: 5/5 Dorsi-flexion: 5/5 Eversion: 5/5 Inversion: 5/5  Leg strength  Quad: 5/5 Hamstring: 5/5 Hip flexor: 5/5 Hip abductors: 4+/5 but symmetric with no improvement from previous exam.  Gait unremarkable. Mild improvement in the tightness of the hamstrings  OMT Physical Exam  Cervical  C2 flexed rotated and side bent right C4 flexed rotated and side bent left  Thoracic T3 extended rotated and side bent left T9 extended rotated and side bent right  Lumbar L2 flexed rotated and side bent left  Sacrum Right on right  Illium  Neutral    Impression and Recommendations:     This case required medical decision making of moderate complexity.

## 2016-07-21 NOTE — Assessment & Plan Note (Signed)
Encouraged patient to do home exercises, hip abductors, and range of motion exercise. We discussed icing regimen and home exercises that we can avoid. Patient is going to try to stay active. Different short course of anti-inflammatory's. Follow-up in 3 weeks.

## 2016-07-21 NOTE — Assessment & Plan Note (Signed)
Decision today to treat with OMT was based on Physical Exam  After verbal consent patient was treated with HVLA, ME techniques in thoracic, lumbar and sacral areas  Patient tolerated the procedure well with improvement in symptoms  Patient given exercises, stretches and lifestyle modifications  See medications in patient instructions if given  Patient will follow up in 3-4 weeks  

## 2016-07-21 NOTE — Patient Instructions (Addendum)
Sorry for the confusion. Duexis 3 times a day for 3 days.  I think the stomach bug took you for a ride.  Ice when you need it See me again in 3 weeks just to be safe and make sure we got you back on track.

## 2016-07-29 ENCOUNTER — Ambulatory Visit: Payer: BC Managed Care – PPO | Admitting: Family Medicine

## 2016-08-14 NOTE — Progress Notes (Signed)
[name] [place], [place] [address] Phone: [phone] Subjective:     CC: Back pain , follow-up  [id number]  Lucas Young is a 60 y.o. male coming in with complaint of back pain. Patient does have chronic back pain likely secondary to poor core strength as well as muscle imbalances.  Was having worsening pain of the lower back on the right side at last exam. Patient states that overall seems to be doing somewhat better. Did have a cramping in his left leg. Patient states that last approximately 2 days and then seemed to resolve. No longer having any pain.  Past Medical History:  Diagnosis Date  . Allergy    Past Surgical History:  Procedure Laterality Date  . APPENDECTOMY     Social History  Substance Use Topics  . Smoking status: Never Smoker  . Smokeless tobacco: Not on file  . Alcohol use No   No Known Allergies Family History  Problem Relation Age of Onset  . Hypertension Mother   . Hypertension Father   . Hyperlipidemia Father   . Hypertension Maternal Grandmother   . Hypertension Maternal Grandfather      Past medical history, social, surgical and family history all reviewed in electronic medical record.   Review of Systems: No headache, visual changes, nausea, vomiting, diarrhea, constipation, dizziness, abdominal pain, skin rash, fevers, chills, night sweats, weight loss, swollen lymph nodes,  chest pain, shortness of breath, mood changes.   Objective  Weight 218 lb (98.9 kg).  General: No apparent distress alert and oriented x3 mood and affect normal, dressed appropriately. Overweight HEENT: Pupils equal, extraocular movements intact  Respiratory: Patient's speak in full sentences and does not appear short of breath  Cardiovascular: No lower extremity edema, non tender, no erythema  Skin: Warm dry intact with no signs of infection or rash on extremities or on axial skeleton.  Abdomen: Soft  nontender  Neuro: Cranial nerves II through XII are intact, neurovascularly intact in all extremities with 2+ DTRs and 2+ pulses.  Lymph: No lymphadenopathy of posterior or anterior cervical chain or axillae bilaterally.  Gait normal with good balance and coordination.  MSK:  Non tender with full range of motion and good stability and symmetric strength and tone of shoulders, elbows, wrist, hip, knee and ankles bilaterally.    Back Exam:  Inspection: Continue mild increase in lordosis Motion: Flexion 43 deg, Extension 25 deg, Side Bending to 45 deg bilaterally,  Rotation to 45 deg bilaterally  SLR laying: Negative  XSLR laying: Negative  Palpable tenderness: Mild tenderness over the right sacroiliac joint FABER:  Still moderately positive right  Sensory change: Gross sensation intact to all lumbar and sacral dermatomes.  Reflexes: 2+ at both patellar tendons, 2+ at achilles tendons, Babinski's downgoing.  Strength at foot  Plantar-flexion: 5/5 Dorsi-flexion: 5/5 Eversion: 5/5 Inversion: 5/5  Leg strength  Quad: 5/5 Hamstring: 5/5 Hip flexor: 5/5 Hip abductors: 4+/5 but symmetric with no improvement from previous exam.  Gait unremarkable. Mild improvement in the tightness of the hamstrings  OMT Physical Exam  Cervical  C2 flexed rotated and side bent right C6 flexed rotated and side bent left  Thoracic T3 extended rotated and side bent left T7 extended rotated and side bent right  Lumbar L2 flexed rotated and side bent left  Sacrum Right on right  Illium  Neutral    Impression and Recommendations:     This case required medical decision making of  moderate complexity.

## 2016-08-15 ENCOUNTER — Encounter: Payer: Self-pay | Admitting: Family Medicine

## 2016-08-15 ENCOUNTER — Ambulatory Visit (INDEPENDENT_AMBULATORY_CARE_PROVIDER_SITE_OTHER): Payer: BC Managed Care – PPO | Admitting: Family Medicine

## 2016-08-15 VITALS — Wt 218.0 lb

## 2016-08-15 DIAGNOSIS — M999 Biomechanical lesion, unspecified: Secondary | ICD-10-CM

## 2016-08-15 DIAGNOSIS — M9904 Segmental and somatic dysfunction of sacral region: Secondary | ICD-10-CM | POA: Diagnosis not present

## 2016-08-15 DIAGNOSIS — M533 Sacrococcygeal disorders, not elsewhere classified: Secondary | ICD-10-CM

## 2016-08-15 DIAGNOSIS — M9903 Segmental and somatic dysfunction of lumbar region: Secondary | ICD-10-CM | POA: Diagnosis not present

## 2016-08-15 DIAGNOSIS — M9902 Segmental and somatic dysfunction of thoracic region: Secondary | ICD-10-CM | POA: Diagnosis not present

## 2016-08-15 NOTE — Assessment & Plan Note (Signed)
Patient seems to be doing somewhat better. We discussed icing regimen and home exercises. We discussed avoiding certain activities. We did discuss the importance of core strength. Patient will continue with his yoga class. Patient come back and see me again in 4-6 weeks.

## 2016-08-15 NOTE — Assessment & Plan Note (Signed)
Decision today to treat with OMT was based on Physical Exam  After verbal consent patient was treated with HVLA, ME techniques in thoracic, lumbar and sacral areas  Patient tolerated the procedure well with improvement in symptoms  Patient given exercises, stretches and lifestyle modifications  See medications in patient instructions if given  Patient will follow up in 4-6 weeks          

## 2016-08-15 NOTE — Patient Instructions (Addendum)
Good to see you  Ice 20 minutes 2 times daily. Usually after activity and before bed. I would consider compression sleeve to the calf.  Iron 65mg  daily with 500mg  of vitamin C or at least 3 times a week.  Keep up everything else with the back looks much better See me again in 4-6 weeks.

## 2016-09-21 NOTE — Progress Notes (Signed)
[facility] [place] [place], [place] [address] Phone: [phone] Subjective:     CC: Back pain , follow-up  [id number]  Lucas Young is a 60 y.o. male coming in with complaint of back pain. Patient does have chronic back pain likely secondary to poor core strength as well as muscle imbalances.  Patient was having some cramping last time. Seems only last couple days. Patient states since then Had been doing fairly well. Patient did have to travel to [place]. Had a death in the family. States though that feels like he is doing. Concerned about his weight.  Past Medical History:  Diagnosis Date  . Allergy    Past Surgical History:  Procedure Laterality Date  . APPENDECTOMY     Social History  Substance Use Topics  . Smoking status: Never Smoker  . Smokeless tobacco: Not on file  . Alcohol use No   No Known Allergies Family History  Problem Relation Age of Onset  . Hypertension Mother   . Hypertension Father   . Hyperlipidemia Father   . Hypertension Maternal Grandmother   . Hypertension Maternal Grandfather      Past medical history, social, surgical and family history all reviewed in electronic medical record.   Review of Systems: No headache, visual changes, nausea, vomiting, diarrhea, constipation, dizziness, abdominal pain, skin rash, fevers, chills, night sweats, weight loss, swollen lymph nodes,  chest pain, shortness of breath, mood changes.   Objective  Blood pressure 134/84, pulse 79, weight 222 lb (100.7 kg), SpO2 94 %.  General: No apparent distress alert and oriented x3 mood and affect normal, dressed appropriately. Overweight HEENT: Pupils equal, extraocular movements intact  Respiratory: Patient's speak in full sentences and does not appear short of breath  Cardiovascular: No lower extremity edema, non tender, no erythema  Skin: Warm dry intact with no signs of infection or rash on extremities or on axial  skeleton.  Abdomen: Soft nontender  Neuro: Cranial nerves II through XII are intact, neurovascularly intact in all extremities with 2+ DTRs and 2+ pulses.  Lymph: No lymphadenopathy of posterior or anterior cervical chain or axillae bilaterally.  Gait normal with good balance and coordination.  MSK:  Non tender with full range of motion and good stability and symmetric strength and tone of shoulders, elbows, wrist, hip, knee and ankles bilaterally.    Back Exam:  Inspection: Continue mild increase in lordosis Motion: Flexion 45 deg, Extension 25 deg, Side Bending to 45 deg bilaterally,  Rotation to 45 deg bilaterally  SLR laying: Negative  XSLR laying: Negative  Palpable tenderness: Very mild discomfort more in the thoracal lumbar junction on the left side   Sensory change: Gross sensation intact to all lumbar and sacral dermatomes.  Reflexes: 2+ at both patellar tendons, 2+ at achilles tendons, Babinski's downgoing.  Strength at foot  Plantar-flexion: 5/5 Dorsi-flexion: 5/5 Eversion: 5/5 Inversion: 5/5  Leg strength  Quad: 5/5 Hamstring: 5/5 Hip flexor: 5/5 Hip abductors: 4+/5 but symmetric mild change from previous exam.    OMT Physical Exam  Cervical  C2 flexed rotated and side bent right C5 flexed rotated and side bent left  Thoracic T3 extended rotated and side bent left T9 extended rotated and side bent right  Lumbar L2 flexed rotated and side bent left  Sacrum Right on right  Illium  Neutral    Impression and Recommendations:     This case required medical decision making of moderate complexity.

## 2016-09-21 NOTE — Assessment & Plan Note (Signed)
Decision today to treat with OMT was based on Physical Exam  After verbal consent patient was treated with HVLA, ME techniques in thoracic, lumbar and sacral areas  Patient tolerated the procedure well with improvement in symptoms  Patient given exercises, stretches and lifestyle modifications  See medications in patient instructions if given  Patient will follow up in 4-8 weeks   Reiterated the importance of conservative therapy and continued current treatment. In addition to what was added today.

## 2016-09-21 NOTE — Assessment & Plan Note (Signed)
Overall this will be more of a chronic problem. We reiterated same treatment options including conservative therapy. We did discuss importance of core strengthening. Discussed proper shoes and over-the-counter orthotics and possible benefits. Discussed which activities to do in which ones to avoid. Follow-up again in 4-8 weeks

## 2016-09-22 ENCOUNTER — Encounter: Payer: Self-pay | Admitting: Family Medicine

## 2016-09-22 ENCOUNTER — Ambulatory Visit (INDEPENDENT_AMBULATORY_CARE_PROVIDER_SITE_OTHER): Payer: BC Managed Care – PPO | Admitting: Family Medicine

## 2016-09-22 VITALS — BP 134/84 | HR 79 | Wt 222.0 lb

## 2016-09-22 DIAGNOSIS — M999 Biomechanical lesion, unspecified: Secondary | ICD-10-CM | POA: Diagnosis not present

## 2016-09-22 DIAGNOSIS — M533 Sacrococcygeal disorders, not elsewhere classified: Secondary | ICD-10-CM | POA: Diagnosis not present

## 2016-09-22 NOTE — Patient Instructions (Addendum)
Always good to see you  Continue with the yoga 1-2 times a week at least  Add resistance training 3 times a week.  Focus on the large muscle groups, legs, back core 2 glasses of water in AM right away.  Eat 100 calories at least every 2 hours to keep metabolism up  Limit carbs after 6-7pm.  DHEA 50 mg daily for 4 weeks See me again in 2 months.

## 2016-11-16 ENCOUNTER — Ambulatory Visit: Payer: BC Managed Care – PPO | Admitting: Family Medicine

## 2016-12-08 ENCOUNTER — Encounter: Payer: Self-pay | Admitting: Family Medicine

## 2016-12-08 ENCOUNTER — Ambulatory Visit (INDEPENDENT_AMBULATORY_CARE_PROVIDER_SITE_OTHER): Payer: BC Managed Care – PPO | Admitting: Family Medicine

## 2016-12-08 VITALS — BP 140/84 | HR 64 | Wt 226.0 lb

## 2016-12-08 DIAGNOSIS — M999 Biomechanical lesion, unspecified: Secondary | ICD-10-CM

## 2016-12-08 DIAGNOSIS — M533 Sacrococcygeal disorders, not elsewhere classified: Secondary | ICD-10-CM | POA: Diagnosis not present

## 2016-12-08 NOTE — Progress Notes (Signed)
[name] [place] [place], [place] [address] Phone: [phone] Subjective:     CC: Back pain , follow-up  [id number]  Lucas Young is a 61 y.o. male coming in with complaint of back pain. Patient does have chronic back pain likely secondary to poor core strength as well as muscle imbalances.  Was doing very well at last exam. Patient was having cramping on the left leg. We attempted and some over-the-counter medications including iron supplementation. Patient states that the cramping is completely resolved at this time. Feeling much better. Patient states that as long as he does yoga on a regular basis but feels relatively good. No radicular symptoms. Just some increasing tightness recently.  Past Medical History:  Diagnosis Date  . Allergy    Past Surgical History:  Procedure Laterality Date  . APPENDECTOMY     Social History  Substance Use Topics  . Smoking status: Never Smoker  . Smokeless tobacco: Not on file  . Alcohol use No   No Known Allergies Family History  Problem Relation Age of Onset  . Hypertension Mother   . Hypertension Father   . Hyperlipidemia Father   . Hypertension Maternal Grandmother   . Hypertension Maternal Grandfather      Past medical history, social, surgical and family history all reviewed in electronic medical record.   Review of Systems: No headache, visual changes, nausea, vomiting, diarrhea, constipation, dizziness, abdominal pain, skin rash, fevers, chills, night sweats, weight loss, swollen lymph nodes, body aches, joint swelling, muscle aches, chest pain, shortness of breath, mood changes.    Objective  Blood pressure 140/84, pulse 64, weight 226 lb (102.5 kg), SpO2 97 %.  Systems examined below as of 12/08/16 General: NAD A&O x3 mood, affect normal  HEENT: Pupils equal, extraocular movements intact no nystagmus Respiratory: not short of breath at rest or with  speaking Cardiovascular: No lower extremity edema, non tender Skin: Warm dry intact with no signs of infection or rash on extremities or on axial skeleton. Abdomen: Soft nontender, no masses Neuro: Cranial nerves  intact, neurovascularly intact in all extremities with 2+ DTRs and 2+ pulses. Lymph: No lymphadenopathy appreciated today  Gait normal with good balance and coordination.  MSK: Non tender with full range of motion and good stability and symmetric strength and tone of shoulders, elbows, wrist,  knee hips and ankles bilaterally.  .    Back Exam:  Inspection: Unremarkable  Motion: Flexion 45 deg, Extension 25 deg, Side Bending to 35 deg bilaterally,  Rotation to 35 deg bilaterally  SLR laying: Negative  XSLR laying: Negative  Palpable tenderness: Still tenderness of the thoracal lumbar juncture mostly on the left side. Mild pain on the medial aspect of the right scapula.Marland Kitchen FABER: negative.  Sensory change: Gross sensation intact to all lumbar and sacral dermatomes.  Reflexes: 2+ at both patellar tendons, 2+ at achilles tendons, Babinski's downgoing.  Strength at foot  Plantar-flexion: 5/5 Dorsi-flexion: 5/5 Eversion: 5/5 Inversion: 5/5  Leg strength  Quad: 5/5 Hamstring: 5/5 Hip flexor: 5/5 does have some tightness with extension Hip abductors: 4+/5 but symmetric. Gait unremarkable.    Osteopathic findings Cervical C2 flexed rotated and side bent right C4 flexed rotated and side bent left C6 flexed rotated and side bent left T3 extended rotated and side bent right inhaled third rib T9 extended rotated and side bent left L2 flexed rotated and side bent right Sacrum right on right    Impression and Recommendations:  This case required medical decision making of moderate complexity.

## 2016-12-08 NOTE — Assessment & Plan Note (Signed)
Doing relatively well. Encourage patient to continue to focus on stretching the hip flexors as well as strength in the hip abductors. We discussed core strengthening as well as stability. Encourage patient to monitor posture. His lungs patient does well we will continue with 8-10 week follow-ups.

## 2016-12-08 NOTE — Assessment & Plan Note (Signed)
Decision today to treat with OMT was based on Physical Exam  After verbal consent patient was treated with HVLA, ME techniques in thoracic, lumbar and sacral areas  Patient tolerated the procedure well with improvement in symptoms  Patient given exercises, stretches and lifestyle modifications  See medications in patient instructions if given  Patient will follow up in 8-10 weeks   Reiterated the importance of conservative therapy and continued current treatment. In addition to what was added today.

## 2016-12-08 NOTE — Patient Instructions (Addendum)
Good to see you  Lucas Young is your friend.  You know the drill  Overall lets keep it up.  Posture and core strength is key  See you again in 8-10 weeks!

## 2017-02-09 ENCOUNTER — Encounter: Payer: Self-pay | Admitting: Family Medicine

## 2017-02-09 ENCOUNTER — Ambulatory Visit (INDEPENDENT_AMBULATORY_CARE_PROVIDER_SITE_OTHER): Payer: BC Managed Care – PPO | Admitting: Family Medicine

## 2017-02-09 VITALS — BP 160/92 | HR 74 | Ht 67.5 in | Wt 224.2 lb

## 2017-02-09 DIAGNOSIS — M999 Biomechanical lesion, unspecified: Secondary | ICD-10-CM

## 2017-02-09 DIAGNOSIS — M533 Sacrococcygeal disorders, not elsewhere classified: Secondary | ICD-10-CM | POA: Diagnosis not present

## 2017-02-09 DIAGNOSIS — M65311 Trigger thumb, right thumb: Secondary | ICD-10-CM

## 2017-02-09 NOTE — Assessment & Plan Note (Signed)
Patient responded well to the injection. We discussed icing regimen and home exercises. Splinted at night. Discussed that likely will need repeat injections every 9-18 months. Follow-up again in 12 weeks

## 2017-02-09 NOTE — Patient Instructions (Addendum)
Great to see you as always.  Say hi to your better half.  Overall doing well.  Wear brace at night You should do well See me again in 10 weeks.

## 2017-02-09 NOTE — Assessment & Plan Note (Signed)
Stable at the moment. Discussed the importance of core strengthening. Patient has had difficulty staying regularly on this. Has noticed that massage has been helpful. Still responds well to manipulation. Follow-up again in 10 weeks

## 2017-02-09 NOTE — Progress Notes (Signed)
[name] [place] [place], [place] [address] Phone: [phone] Subjective:     CC: Back pain , follow-up  [id number]  Lucas Young is a 61 y.o. male coming in with complaint of back pain. Patient does have chronic back pain likely secondary to poor core strength as well as muscle imbalances.  We have seen patient multiple times. Has responded fairly well to osteopathic manipulation. Was having cramping and did respond well to iron supplementation. Patient to 8-10 week intervals.Patient states Overall he seems to be doing relatively well. Patient states some back pain still seems about the same. Patient has some mild tightness over the course last week. No new symptoms just some mild worsening of previous symptoms.  Patient is also new problem. Patient is having right thumb pain. Seems gets stuck in a flexed position. Been going on for the last 2 weeks and seems to be worsening. Starting affect daily activities and can wake him up at night. Does not remember any true injury. Has not tried any home modalities at this point. Rates the severity of pain when it does pop is 6 out of 10 but seems to be worsening.  Past Medical History:  Diagnosis Date  . Allergy    Past Surgical History:  Procedure Laterality Date  . APPENDECTOMY     Social History  Substance Use Topics  . Smoking status: Never Smoker  . Smokeless tobacco: Never Used  . Alcohol use No   No Known Allergies Family History  Problem Relation Age of Onset  . Hypertension Mother   . Hypertension Father   . Hyperlipidemia Father   . Hypertension Maternal Grandmother   . Hypertension Maternal Grandfather      Past medical history, social, surgical and family history all reviewed in electronic medical record.   Review of Systems: No headache, visual changes, nausea, vomiting, diarrhea, constipation, dizziness, abdominal pain, skin rash, fevers, chills, night sweats,  weight loss, swollen lymph nodes, body aches, joint swelling, muscle aches, chest pain, shortness of breath, mood changes.     Objective  Blood pressure (!) 160/92, pulse 74, height 5' 7.5" (1.715 m), weight 224 lb 4 oz (101.7 kg), SpO2 94 %.  Systems examined below as of 02/09/17 General: NAD A&O x3 mood, affect normal  HEENT: Pupils equal, extraocular movements intact no nystagmus Respiratory: not short of breath at rest or with speaking Cardiovascular: No lower extremity edema, non tender Skin: Warm dry intact with no signs of infection or rash on extremities or on axial skeleton. Abdomen: Soft nontender, no masses Neuro: Cranial nerves  intact, neurovascularly intact in all extremities with 2+ DTRs and 2+ pulses. Lymph: No lymphadenopathy appreciated today  Gait normal with good balance and coordination.  MSK: Non tender with full range of motion and good stability and symmetric strength and tone of shoulders, elbows, wrist,  knee hips and ankles bilaterally.    .    Back Exam:  Inspection: Unremarkable  Motion: Flexion 45 deg, Extension 20 deg, Side Bending to 35 deg bilaterally,  Rotation to 35 deg bilaterally very mild increasing tightness from previous exam SLR laying: Negative  XSLR laying: Negative  Palpable tenderness: Discomfort more in the thoracolumbar juncture left greater than right. Somewhat different than usual. FABER: negative.  Sensory change: Gross sensation intact to all lumbar and sacral dermatomes.  Reflexes: 2+ at both patellar tendons, 2+ at achilles tendons, Babinski's downgoing.  Strength at foot  Plantar-flexion: 5/5 Dorsi-flexion: 5/5 Eversion:  5/5 Inversion: 5/5  Leg strength  Quad: 5/5 Hamstring: 5/5 Hip flexor: 5/5 does have some tightness with extension Hip abductors: Full and symmetric. Gait unremarkable.  Right-hand exam shows the patient does have a trigger nodule just proximal to the IP joint. Severely tender to palpation.   Osteopathic  findings Cervical C2 flexed rotated and side bent right C3 flexed rotated and side bent left C6 flexed rotated and side bent left T3 extended rotated and side bent right inhaled third rib T8 extended rotated and side bent left L2 flexed rotated and side bent right Sacrum right on right  After verbal consent patient was prepped with alcohol swabs and with a 25-gauge half-inch needle was injected with 0.5 mL of 0.5% Marcaine and 0.5 mL of Kenalog 40 mg/dL. This was in the flexor tendon sheath. Tolerated the procedure well. Postinjection instructions given.    Impression and Recommendations:     This case required medical decision making of moderate complexity.

## 2017-02-09 NOTE — Assessment & Plan Note (Signed)
Decision today to treat with OMT was based on Physical Exam  After verbal consent patient was treated with HVLA, ME, FPR techniques in cervical, thoracic, lumbar and sacral areas  Patient tolerated the procedure well with improvement in symptoms  Patient given exercises, stretches and lifestyle modifications  See medications in patient instructions if given  Patient will follow up in 10 weeks

## 2017-04-20 ENCOUNTER — Ambulatory Visit (INDEPENDENT_AMBULATORY_CARE_PROVIDER_SITE_OTHER): Payer: BC Managed Care – PPO | Admitting: Family Medicine

## 2017-04-20 ENCOUNTER — Encounter: Payer: Self-pay | Admitting: Family Medicine

## 2017-04-20 VITALS — BP 128/84 | HR 61 | Ht 67.5 in | Wt 214.0 lb

## 2017-04-20 DIAGNOSIS — M533 Sacrococcygeal disorders, not elsewhere classified: Secondary | ICD-10-CM

## 2017-04-20 DIAGNOSIS — M999 Biomechanical lesion, unspecified: Secondary | ICD-10-CM

## 2017-04-20 NOTE — Progress Notes (Signed)
  [facility] [place] [place], [place] [address] Phone: [phone] Subjective:     CC: Back pain , follow-up  [id number]  Lucas Young is a 61 y.o. male coming in with complaint of back pain. Patient does have chronic back pain likely secondary to poor core strength as well as muscle imbalances.  Patient has been doing relatively well with osteopathic manipulation and conservative therapy. Patient continues to do yoga on a regular basis. States some mild increasing upper back pain. Mild. Continues to be active.   Patient at last exam did have more of a trigger thumb. Patient elected to have an injection. Patient states was 100% better 3 days after the injection and continues to do well.  Past Medical History:  Diagnosis Date  . Allergy    Past Surgical History:  Procedure Laterality Date  . APPENDECTOMY     Social History  Substance Use Topics  . Smoking status: Never Smoker  . Smokeless tobacco: Never Used  . Alcohol use No   No Known Allergies Family History  Problem Relation Age of Onset  . Hypertension Mother   . Hypertension Father   . Hyperlipidemia Father   . Hypertension Maternal Grandmother   . Hypertension Maternal Grandfather      Past medical history, social, surgical and family history all reviewed in electronic medical record.   Review of Systems: No headache, visual changes, nausea, vomiting, diarrhea, constipation, dizziness, abdominal pain, skin rash, fevers, chills, night sweats, weight loss, swollen lymph nodes, body aches, joint swelling, muscle aches, chest pain, shortness of breath, mood changes.   Objective  Blood pressure 128/84, pulse 61, height 5' 7.5" (1.715 m), weight 214 lb (97.1 kg), SpO2 96 %.  Systems examined below as of 04/20/17 General: NAD A&O x3 mood, affect normal  HEENT: Pupils equal, extraocular movements intact no nystagmus Respiratory: not short of breath at rest or with  speaking Cardiovascular: No lower extremity edema, non tender Skin: Warm dry intact with no signs of infection or rash on extremities or on axial skeleton. Abdomen: Soft nontender, no masses Neuro: Cranial nerves  intact, neurovascularly intact in all extremities with 2+ DTRs and 2+ pulses. Lymph: No lymphadenopathy appreciated today  Gait normal with good balance and coordination.  MSK: Non tender with full range of motion and good stability and symmetric strength and tone of shoulders, elbows, wrist,  knee hips and ankles bilaterally.  .    .    Back Exam:  Inspection: Unremarkable  Motion: Flexion 45 deg, Extension 25 deg, Side Bending to 30 deg bilaterally,  Rotation to 35 deg bilaterally  SLR laying: Negative  XSLR laying: Negative  Palpable tenderness: More discomfort in the thoracolumbar juncture than previously. FABER: negative. Sensory change: Gross sensation intact to all lumbar and sacral dermatomes.  Reflexes: 2+ at both patellar tendons, 2+ at achilles tendons, Babinski's downgoing.  Strength at foot  Plantar-flexion: 5/5 Dorsi-flexion: 5/5 Eversion: 5/5 Inversion: 5/5  Leg strength  Quad: 5/5 Hamstring: 5/5 Hip flexor: 5/5 Hip abductors: 5/5  Gait unremarkable. .  Osteopathic findings C2 flexed rotated and side bent right C6 flexed rotated and side bent left T3 extended rotated and side bent right inhaled third rib T10 extended rotated and side bent left L2 flexed rotated and side bent right Sacrum right on right    Impression and Recommendations:     This case required medical decision making of moderate complexity.

## 2017-04-20 NOTE — Assessment & Plan Note (Signed)
Decision today to treat with OMT was based on Physical Exam  After verbal consent patient was treated with HVLA, ME, FPR techniques in cervical, thoracic, lumbar and sacral areas  Patient tolerated the procedure well with improvement in symptoms  Patient given exercises, stretches and lifestyle modifications  See medications in patient instructions if given  Patient will follow up in 4-8 weeks 

## 2017-04-20 NOTE — Assessment & Plan Note (Signed)
Stable. No significant changes. No significant changes in management. I would like patient to continue to work on core strength. Patient's is no longer working long hours during the summer. He'll be traveling in July. Follow-up again before he leaves.

## 2017-04-20 NOTE — Patient Instructions (Addendum)
Good to see you  You are doing great overall.  No idea what happen with the rib  Looks like you should continue what you are doing See me before going to [place].

## 2017-06-05 ENCOUNTER — Encounter: Payer: Self-pay | Admitting: Family Medicine

## 2017-06-05 ENCOUNTER — Ambulatory Visit (INDEPENDENT_AMBULATORY_CARE_PROVIDER_SITE_OTHER): Payer: BC Managed Care – PPO | Admitting: Family Medicine

## 2017-06-05 VITALS — BP 128/80 | HR 50 | Ht 67.5 in | Wt 214.0 lb

## 2017-06-05 DIAGNOSIS — M999 Biomechanical lesion, unspecified: Secondary | ICD-10-CM | POA: Diagnosis not present

## 2017-06-05 DIAGNOSIS — M533 Sacrococcygeal disorders, not elsewhere classified: Secondary | ICD-10-CM | POA: Diagnosis not present

## 2017-06-05 NOTE — Assessment & Plan Note (Signed)
Decision today to treat with OMT was based on Physical Exam  After verbal consent patient was treated with HVLA, ME, FPR techniques in cervical, thoracic, lumbar and sacral areas  Patient tolerated the procedure well with improvement in symptoms  Patient given exercises, stretches and lifestyle modifications  See medications in patient instructions if given  Patient will follow up in 6-12 weeks 

## 2017-06-05 NOTE — Patient Instructions (Signed)
God to see you  Have a great trip  Try to keep stretching  See me again in 6-8 weeks!

## 2017-06-05 NOTE — Assessment & Plan Note (Signed)
Still some mild discomfort over the right sacroiliac joint. Continue with decompression exam. Encourage weight loss. Patient is doing well and we'll see him again in 6-12 weeks. Continue conservative therapy otherwise.

## 2017-06-05 NOTE — Progress Notes (Signed)
  [name] [place], [place] [address] Phone: [phone] Subjective:     CC: Back pain , follow-up  [id number]  Lucas Young is a 61 y.o. male coming in with complaint of back pain. Has seen patient multiple times. Patient is increasing activity.Patient is doing relatively well. Very minimal pain at the moment. Radicular symptoms is significantly decreased. Patient is happy with the results of far.  Past Medical History:  Diagnosis Date  . Allergy    Past Surgical History:  Procedure Laterality Date  . APPENDECTOMY     Social History  Substance Use Topics  . Smoking status: Never Smoker  . Smokeless tobacco: Never Used  . Alcohol use No   No Known Allergies Family History  Problem Relation Age of Onset  . Hypertension Mother   . Hypertension Father   . Hyperlipidemia Father   . Hypertension Maternal Grandmother   . Hypertension Maternal Grandfather      Past medical history, social, surgical and family history all reviewed in electronic medical record.   Review of Systems: No headache, visual changes, nausea, vomiting, diarrhea, constipation, dizziness, abdominal pain, skin rash, fevers, chills, night sweats, weight loss, swollen lymph nodes, body aches, joint swelling,  chest pain, shortness of breath, mood changes.  Positive muscle aches  Objective  Blood pressure 128/80, pulse (!) 50, height 5' 7.5" (1.715 m), weight 214 lb (97.1 kg), SpO2 98 %.  Systems examined below as of 06/05/17 General: NAD A&O x3 mood, affect normal  HEENT: Pupils equal, extraocular movements intact no nystagmus Respiratory: not short of breath at rest or with speaking Cardiovascular: No lower extremity edema, non tender Skin: Warm dry intact with no signs of infection or rash on extremities or on axial skeleton. Abdomen: Soft nontender, no masses Neuro: Cranial nerves  intact, neurovascularly intact in all extremities with 2+  DTRs and 2+ pulses. Lymph: No lymphadenopathy appreciated today  Gait normal with good balance and coordination.  MSK: Non tender with full range of motion and good stability and symmetric strength and tone of shoulders, elbows, wrist,  knee hips and ankles bilaterally.      .    Back Exam:  Inspection: Unremarkable  Motion: Flexion 45 deg, Extension 25 deg, Side Bending to 35 deg bilaterally,  Rotation to 45 deg bilaterally  SLR laying: Negative  XSLR laying: Negative  Palpable tenderness: Tender to palpation in the paraspinal musculature lumbar spine right greater than left.Marland Kitchen FABER: Mild positive on the right. Sensory change: Gross sensation intact to all lumbar and sacral dermatomes.  Reflexes: 2+ at both patellar tendons, 2+ at achilles tendons, Babinski's downgoing.  Strength at foot  Plantar-flexion: 5/5 Dorsi-flexion: 5/5 Eversion: 5/5 Inversion: 5/5  Leg strength  Quad: 5/5 Hamstring: 5/5 Hip flexor: 5/5 Hip abductors: 5/5  Gait unremarkable.  . Osteopathic findings C2 flexed rotated and side bent right C4 flexed rotated and side bent left C5 flexed rotated and side bent left T3 extended rotated and side bent right inhaled third rib T7 extended rotated and side bent left L2 flexed rotated and side bent right Sacrum right on right     Impression and Recommendations:     This case required medical decision making of moderate complexity.

## 2017-07-24 ENCOUNTER — Ambulatory Visit (INDEPENDENT_AMBULATORY_CARE_PROVIDER_SITE_OTHER): Payer: BC Managed Care – PPO | Admitting: Family Medicine

## 2017-07-24 ENCOUNTER — Encounter: Payer: Self-pay | Admitting: Family Medicine

## 2017-07-24 VITALS — BP 128/82 | HR 50 | Ht 67.5 in | Wt 214.0 lb

## 2017-07-24 DIAGNOSIS — M999 Biomechanical lesion, unspecified: Secondary | ICD-10-CM

## 2017-07-24 DIAGNOSIS — M533 Sacrococcygeal disorders, not elsewhere classified: Secondary | ICD-10-CM

## 2017-07-24 NOTE — Assessment & Plan Note (Signed)
Patient seems to be doing relatively well. We discussed with patient again at great length. We discussed icing regimen. Home exercises, follow-up with me again in 6-8 weeks

## 2017-07-24 NOTE — Progress Notes (Signed)
  [facility] [place], [place] [address] Phone: [phone] Subjective:     CC: Back pain , follow-up  [id number]  Lucas Young is a 61 y.o. male coming in with complaint of back pain. Patient has been doing relatively well. 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 Discussed icing regimen, home exercises, which activities doing which ones to avoid. Patient continues to do back area. Did improve after last manipulation but now having worsening symptoms again  Past Medical History:  Diagnosis Date  . Allergy    Past Surgical History:  Procedure Laterality Date  . APPENDECTOMY     Social History  Substance Use Topics  . Smoking status: Never Smoker  . Smokeless tobacco: Never Used  . Alcohol use No   No Known Allergies Family History  Problem Relation Age of Onset  . Hypertension Mother   . Hypertension Father   . Hyperlipidemia Father   . Hypertension Maternal Grandmother   . Hypertension Maternal Grandfather      Past medical history, social, surgical and family history all reviewed in electronic medical record.   Review of Systems: No headache, visual changes, nausea, vomiting, diarrhea, constipation, dizziness, abdominal pain, skin rash, fevers, chills, night sweats, weight loss, swollen lymph nodes, body aches, joint swelling, muscle aches, chest pain, shortness of breath, mood changes.    Objective  Height 5' 7.5" (1.715 m).  Systems examined below as of 07/24/17 General: NAD A&O x3 mood, affect normal  HEENT: Pupils equal, extraocular movements intact no nystagmus Respiratory: not short of breath at rest or with speaking Cardiovascular: No lower extremity edema, non tender Skin: Warm dry intact with no signs of infection or rash on extremities or on axial skeleton. Abdomen: Soft nontender, no masses Neuro: Cranial nerves  intact, neurovascularly intact in all extremities with 2+  DTRs and 2+ pulses. Lymph: No lymphadenopathy appreciated today  Gait normal with good balance and coordination.  MSK: Non tender with full range of motion and good stability and symmetric strength and tone of shoulders, elbows, wrist,  knee hips and ankles bilaterally.     .    Back Exam:  Inspection: Unremarkable  Motion: Flexion 45 deg, Extension 25 deg, Side Bending to 45 deg bilaterally,  Rotation to 45 deg bilaterally  SLR laying: Negative  XSLR laying: Negative  Palpable tenderness: Marland Kitchen Mild tenderness in the thoracolumbar juncture on the left side FABER: negative. Sensory change: Gross sensation intact to all lumbar and sacral dermatomes.  Reflexes: 2+ at both patellar tendons, 2+ at achilles tendons, Babinski's downgoing.  Strength at foot  Plantar-flexion: 5/5 Dorsi-flexion: 5/5 Eversion: 5/5 Inversion: 5/5  Leg strength  Quad: 5/5 Hamstring: 5/5 Hip flexor: 5/5 Hip abductors: 5/5  Gait unremarkable.   Osteopathic findings C2 flexed rotated and side bent right C4 flexed rotated and side bent left C7 flexed rotated and side bent left T3 extended rotated and side bent right inhaled third rib T11 extended rotated and side bent left L2 flexed rotated and side bent right Sacrum right on right     Impression and Recommendations:     This case required medical decision making of moderate complexity.

## 2017-07-24 NOTE — Patient Instructions (Signed)
Good to see you  Alvera Singh is your friend.  I am impressed overall.  6 weeks

## 2017-07-24 NOTE — Assessment & Plan Note (Signed)
Decision today to treat with OMT was based on Physical Exam  After verbal consent patient was treated with HVLA, ME, FPR techniques in cervical, thoracic, lumbar and sacral areas  Patient tolerated the procedure well with improvement in symptoms  Patient given exercises, stretches and lifestyle modifications  See medications in patient instructions if given  Patient will follow up in 6-8 weeks 

## 2017-09-11 NOTE — Progress Notes (Signed)
[name] [place], [place] [address] Phone: [phone] Subjective:    I'm seeing this patient by the request  of:    CC: Low back pain follow-up  [id number]  Lucas Young is a 61 y.o. male coming in with complaint of low back pain. Patient states that his back needs an adjustment and that his right thumb is bothering him again and has been clicking for the past 2 days. Patient states though it is not as bad as what it was previously.  Back and neck pain still there. Having increasing stress. I did do some cleaning up around his house. Also had some water damage. Was doing a lot more repetitive activity. More tightness on the left upper back. No radiation to any of the extremities      Past Medical History:  Diagnosis Date  . Allergy    Past Surgical History:  Procedure Laterality Date  . APPENDECTOMY     Social History   Social History  . Marital status: Married    Spouse name: N/A  . Number of children: N/A  . Years of education: N/A   Social History Main Topics  . Smoking status: Never Smoker  . Smokeless tobacco: Never Used  . Alcohol use No  . Drug use: No  . Sexual activity: Not Asked   Other Topics Concern  . None   Social History Narrative  . None   No Known Allergies Family History  Problem Relation Age of Onset  . Hypertension Mother   . Hypertension Father   . Hyperlipidemia Father   . Hypertension Maternal Grandmother   . Hypertension Maternal Grandfather      Past medical history, social, surgical and family history all reviewed in electronic medical record.  No pertanent information unless stated regarding to the chief complaint.   Review of Systems:Review of systems updated and as accurate as of 09/12/17  No headache, visual changes, nausea, vomiting, diarrhea, constipation, dizziness, abdominal pain, skin rash, fevers, chills, night sweats, weight loss, swollen lymph nodes, body  aches, joint swelling, muscle aches, chest pain, shortness of breath, mood changes.   Objective  Blood pressure (!) 142/96, pulse 70, weight 217 lb (98.4 kg), SpO2 95 %. Systems examined below as of 09/12/17   General: No apparent distress alert and oriented x3 mood and affect normal, dressed appropriately.  HEENT: Pupils equal, extraocular movements intact  Respiratory: Patient's speak in full sentences and does not appear short of breath  Cardiovascular: No lower extremity edema, non tender, no erythema  Skin: Warm dry intact with no signs of infection or rash on extremities or on axial skeleton.  Abdomen: Soft nontender  Neuro: Cranial nerves II through XII are intact, neurovascularly intact in all extremities with 2+ DTRs and 2+ pulses.  Lymph: No lymphadenopathy of posterior or anterior cervical chain or axillae bilaterally.  Gait normal with good balance and coordination.  MSK:  Non tender with full range of motion and good stability and symmetric strength and tone of shoulders, elbows, wrist, hip, knee and ankles bilaterally. Mild triggering of the right thumb tender to palpation at the A2 pulley Back Exam:  Inspection: Mild loss in lordosis Motion: Flexion 45 deg, Extension 25 deg, Side Bending to 35 deg bilaterally,  Rotation to 45 deg bilaterally  SLR laying: Negative  XSLR laying: Negative  Palpable tenderness: Tender to palpation in the thoracolumbar juncture on the left side. FABER: Mild tightness on the side. Sensory  change: Gross sensation intact to all lumbar and sacral dermatomes.  Reflexes: 2+ at both patellar tendons, 2+ at achilles tendons, Babinski's downgoing.  Strength at foot  Plantar-flexion: 5/5 Dorsi-flexion: 5/5 Eversion: 5/5 Inversion: 5/5  Leg strength  Quad: 5/5 Hamstring: 5/5 Hip flexor: 5/5 Hip abductors: 5/5  Gait unremarkable.   Osteopathic findings  C2 flexed rotated and side bent right C4 flexed rotated and side bent left C7 flexed rotated and  side bent right T3 extended rotated and side bent right inhaled third rib T11 extended rotated and side bent left L2 flexed rotated and side bent right Sacrum right on right    Impression and Recommendations:     This case required medical decision making of moderate complexity.      Note: This dictation was prepared with Dragon dictation along with smaller phrase technology. Any transcriptional errors that result from this process are unintentional.

## 2017-09-12 ENCOUNTER — Encounter: Payer: Self-pay | Admitting: Family Medicine

## 2017-09-12 ENCOUNTER — Ambulatory Visit (INDEPENDENT_AMBULATORY_CARE_PROVIDER_SITE_OTHER): Payer: BC Managed Care – PPO | Admitting: Family Medicine

## 2017-09-12 ENCOUNTER — Ambulatory Visit: Payer: Self-pay

## 2017-09-12 VITALS — BP 142/96 | HR 70 | Wt 217.0 lb

## 2017-09-12 DIAGNOSIS — M79644 Pain in right finger(s): Principal | ICD-10-CM

## 2017-09-12 DIAGNOSIS — G8929 Other chronic pain: Secondary | ICD-10-CM

## 2017-09-12 DIAGNOSIS — M533 Sacrococcygeal disorders, not elsewhere classified: Secondary | ICD-10-CM | POA: Diagnosis not present

## 2017-09-12 DIAGNOSIS — M999 Biomechanical lesion, unspecified: Secondary | ICD-10-CM

## 2017-09-12 NOTE — Assessment & Plan Note (Signed)
Stiff overall. Patient did have more pain in the thoracic lumbar junction. We discussed icing regimen and home exercises. We discussed core stability. Patient will restart his yoga class. Patient come back and see me again in 6-8 weeks.

## 2017-09-12 NOTE — Patient Instructions (Signed)
Good to see you  Keep it up  Lets watch the thumb See me again in 6 weeks

## 2017-09-12 NOTE — Assessment & Plan Note (Signed)
Decision today to treat with OMT was based on Physical Exam  After verbal consent patient was treated with HVLA, ME, FPR techniques in cervical, thoracic, lumbar and sacral areas  Patient tolerated the procedure well with improvement in symptoms  Patient given exercises, stretches and lifestyle modifications  See medications in patient instructions if given  Patient will follow up in 6-8 weeks 

## 2017-10-23 NOTE — Progress Notes (Signed)
[facility] [place], [place] [address] Phone: [phone] Subjective:    I'm seeing this patient by the request  of:    CC: low back pain,   [id number]  Lucas Young is a 61 y.o. male coming in with complaint of low back pain. Patient states that his lower back is doing great but that his thumb is triggering again. He is unable to make a fist and states that at night he wakes up in pain.  Low back patient states is doing very well with the manipulation, home yoga, as well as foam roller.    Past Medical History:  Diagnosis Date  . Allergy    Past Surgical History:  Procedure Laterality Date  . APPENDECTOMY     Social History   Socioeconomic History  . Marital status: Married    Spouse name: None  . Number of children: None  . Years of education: None  . Highest education level: None  Social Needs  . Financial resource strain: None  . Food insecurity - worry: None  . Food insecurity - inability: None  . Transportation needs - medical: None  . Transportation needs - non-medical: None  Occupational History  . None  Tobacco Use  . Smoking status: Never Smoker  . Smokeless tobacco: Never Used  Substance and Sexual Activity  . Alcohol use: No    Alcohol/week: 0.0 oz  . Drug use: No  . Sexual activity: None  Other Topics Concern  . None  Social History Narrative  . None   No Known Allergies Family History  Problem Relation Age of Onset  . Hypertension Mother   . Hypertension Father   . Hyperlipidemia Father   . Hypertension Maternal Grandmother   . Hypertension Maternal Grandfather      Past medical history, social, surgical and family history all reviewed in electronic medical record.  No pertanent information unless stated regarding to the chief complaint.   Review of Systems:Review of systems updated and as accurate as of 10/24/17  No headache, visual changes, nausea, vomiting, diarrhea,  constipation, dizziness, abdominal pain, skin rash, fevers, chills, night sweats, weight loss, swollen lymph nodes, body aches, joint swelling, chest pain, shortness of breath, mood changes.  Positive muscle aches  Objective  Blood pressure 126/86, pulse 72, height 5\' 7"  (1.702 m), weight 222 lb (100.7 kg), SpO2 98 %. Systems examined below as of 10/24/17   General: No apparent distress alert and oriented x3 mood and affect normal, dressed appropriately.  HEENT: Pupils equal, extraocular movements intact  Respiratory: Patient's speak in full sentences and does not appear short of breath  Cardiovascular: No lower extremity edema, non tender, no erythema  Skin: Warm dry intact with no signs of infection or rash on extremities or on axial skeleton.  Abdomen: Soft nontender  Neuro: Cranial nerves II through XII are intact, neurovascularly intact in all extremities with 2+ DTRs and 2+ pulses.  Lymph: No lymphadenopathy of posterior or anterior cervical chain or axillae bilaterally.  Gait normal with good balance and coordination.  MSK:  Non tender with full range of motion and good stability and symmetric strength and tone of shoulders, elbows, wrist, hip, knee and ankles bilaterally.  Right hand exam shows the patient does have a large nodule that is tender to palpation over the A2 pulley on the palmar aspect of the thumb.  Triggering noted.  Neurovascularly intact distally  Low back exam shows the patient does have  some mild stiffness lacking left 5 degrees of flexion in the last 10 degrees of extension.  Full rotation and side bending.  Negative straight leg test.  Positive Faber test on the right.  Osteopathic findings C2 flexed rotated and side bent left  T3 extended rotated and side bent right inhaled third rib T8 extended rotated and side bent right  L3 flexed rotated and side bent right Sacrum right on right  Procedure: Real-time Ultrasound Guided Injection of right flexor tendon sheath  thumb Device: GE Logiq Q7 Ultrasound guided injection is preferred based studies that show increased duration, increased effect, greater accuracy, decreased procedural pain, increased response rate, and decreased cost with ultrasound guided versus blind injection.  Verbal informed consent obtained.  Time-out conducted.  Noted no overlying erythema, induration, or other signs of local infection.  Skin prepped in a sterile fashion.  Local anesthesia: Topical Ethyl chloride.  With sterile technique and under real time ultrasound guidance: with 25g 1/2 inch needle injected 0.5 cc of 0.5% Marcaine and 0.5 cc of Kenalog 40 mg/mL into the flexor tendon sheath Completed without difficulty  Pain immediately resolved suggesting accurate placement of the medication.   Advised to call if fevers/chills, erythema, induration, drainage, or persistent bleeding.  Images permanently stored and available for review in the ultrasound unit.  Impression: Technically successful ultrasound guided injection.   Impression and Recommendations:     This case required medical decision making of moderate complexity.      Note: This dictation was prepared with Dragon dictation along with smaller phrase technology. Any transcriptional errors that result from this process are unintentional.

## 2017-10-24 ENCOUNTER — Ambulatory Visit: Payer: BC Managed Care – PPO | Admitting: Family Medicine

## 2017-10-24 ENCOUNTER — Encounter: Payer: Self-pay | Admitting: Family Medicine

## 2017-10-24 ENCOUNTER — Ambulatory Visit: Payer: Self-pay

## 2017-10-24 VITALS — BP 126/86 | HR 72 | Ht 67.0 in | Wt 222.0 lb

## 2017-10-24 DIAGNOSIS — M79644 Pain in right finger(s): Secondary | ICD-10-CM

## 2017-10-24 DIAGNOSIS — M533 Sacrococcygeal disorders, not elsewhere classified: Secondary | ICD-10-CM | POA: Diagnosis not present

## 2017-10-24 DIAGNOSIS — G8929 Other chronic pain: Secondary | ICD-10-CM

## 2017-10-24 DIAGNOSIS — M65311 Trigger thumb, right thumb: Secondary | ICD-10-CM | POA: Diagnosis not present

## 2017-10-24 DIAGNOSIS — M999 Biomechanical lesion, unspecified: Secondary | ICD-10-CM | POA: Diagnosis not present

## 2017-10-24 NOTE — Assessment & Plan Note (Signed)
Decision today to treat with OMT was based on Physical Exam  After verbal consent patient was treated with HVLA, ME, FPR techniques in cervical, thoracic, lumbar and sacral areas  Patient tolerated the procedure well with improvement in symptoms  Patient given exercises, stretches and lifestyle modifications  See medications in patient instructions if given  Patient will follow up in 6 weeks 

## 2017-10-24 NOTE — Patient Instructions (Signed)
Good to see you  Happy holidays!  See you again in 5-6 weeks!

## 2017-10-24 NOTE — Assessment & Plan Note (Signed)
Patient given an injection today.  Tolerated the procedure well.  Hopefully this will last 8 months or longer again.  Discussed icing regimen, home exercise, which activities to do which wants to avoid.  Patient will increase activity slowly over the course the next several days.  If worsening symptoms possible surgical intervention may be necessary.  We also discussed the possibility of PRP injections.

## 2017-10-24 NOTE — Assessment & Plan Note (Signed)
Stable.  Doing well with home exercises.  Encourage core strengthening.  Discussed icing regimen and home exercises.  Patient is to do more core stability and hip abductor strengthening.  Follow-up again in 6-8 weeks.

## 2017-12-04 NOTE — Progress Notes (Signed)
[name] [place], [place] [address] Phone: [phone] Subjective:    I'm seeing this patient by the request  of:  CC: back pain follow up   [id number]  Lucas Young is a 62 y.o. male coming in with complaint of back pain. He said that he has been having tightness on the right side of his spine over the T4 region. The increase in pain has been for the past week. Pain character is sharp.  Patient states that this is a little more severe.  Was catching his breath immediately but seems to be improving.  He did have some mild traveling but only driving.  No long distance fine.  Denies any lower extremity swelling     Past Medical History:  Diagnosis Date  . Allergy    Past Surgical History:  Procedure Laterality Date  . APPENDECTOMY     Social History   Socioeconomic History  . Marital status: Married    Spouse name: Not on file  . Number of children: Not on file  . Years of education: Not on file  . Highest education level: Not on file  Social Needs  . Financial resource strain: Not on file  . Food insecurity - worry: Not on file  . Food insecurity - inability: Not on file  . Transportation needs - medical: Not on file  . Transportation needs - non-medical: Not on file  Occupational History  . Not on file  Tobacco Use  . Smoking status: Never Smoker  . Smokeless tobacco: Never Used  Substance and Sexual Activity  . Alcohol use: No    Alcohol/week: 0.0 oz  . Drug use: No  . Sexual activity: Not on file  Other Topics Concern  . Not on file  Social History Narrative  . Not on file   No Known Allergies Family History  Problem Relation Age of Onset  . Hypertension Mother   . Hypertension Father   . Hyperlipidemia Father   . Hypertension Maternal Grandmother   . Hypertension Maternal Grandfather      Past medical history, social, surgical and family history all reviewed in electronic medical record.   No pertanent information unless stated regarding to the chief complaint.   Review of Systems:Review of systems updated and as accurate as of 12/04/17  No headache, visual changes, nausea, vomiting, diarrhea, constipation, dizziness, abdominal pain, skin rash, fevers, chills, night sweats, weight loss, swollen lymph nodes, body aches, joint swelling, muscle aches, chest pain, shortness of breath, mood changes.   Objective  There were no vitals taken for this visit. Systems examined below as of 12/04/17   General: No apparent distress alert and oriented x3 mood and affect normal, dressed appropriately.  HEENT: Pupils equal, extraocular movements intact  Respiratory: Patient's speak in full sentences and does not appear short of breath  Cardiovascular: No lower extremity edema, non tender, no erythema  Skin: Warm dry intact with no signs of infection or rash on extremities or on axial skeleton.  Abdomen: Soft nontender  Neuro: Cranial nerves II through XII are intact, neurovascularly intact in all extremities with 2+ DTRs and 2+ pulses.  Lymph: No lymphadenopathy of posterior or anterior cervical chain or axillae bilaterally.  Gait normal with good balance and coordination.  MSK:  Non tender with full range of motion and good stability and symmetric strength and tone of shoulders, elbows, wrist, hip, knee and ankles bilaterally.  Back Exam:  Inspection: Mild loss  of lordosis and poor core strength Motion: Flexion 45 deg, Extension 25 deg, Side Bending to 35 deg bilaterally,  Rotation to 45 deg bilaterally  SLR laying: Negative  XSLR laying: Negative  Palpable tenderness: More discomfort over the right and left Perry's scapular region in the soft muscle.Marland Kitchen FABER: negative. Sensory change: Gross sensation intact to all lumbar and sacral dermatomes.  Reflexes: 2+ at both patellar tendons, 2+ at achilles tendons, Babinski's downgoing.  Strength at foot  Plantar-flexion: 5/5 Dorsi-flexion: 5/5  Eversion: 5/5 Inversion: 5/5  Leg strength  Quad: 5/5 Hamstring: 5/5 Hip flexor: 5/5 Hip abductors: 5/5  Gait unremarkable.  Osteopathic findings C2 flexed rotated and side bent right C4 flexed rotated and side bent left C6 flexed rotated and side bent left T3 extended rotated and side bent right inhaled third rib T5 extended rotated and side bent left with inhaled rib L3 flexed rotated and side bent right Sacrum right on right     Impression and Recommendations:     This case required medical decision making of moderate complexity.      Note: This dictation was prepared with Dragon dictation along with smaller phrase technology. Any transcriptional errors that result from this process are unintentional.

## 2017-12-05 ENCOUNTER — Encounter: Payer: Self-pay | Admitting: Family Medicine

## 2017-12-05 ENCOUNTER — Ambulatory Visit: Payer: BC Managed Care – PPO | Admitting: Family Medicine

## 2017-12-05 VITALS — BP 140/98 | HR 75 | Wt 222.0 lb

## 2017-12-05 DIAGNOSIS — M545 Low back pain, unspecified: Secondary | ICD-10-CM

## 2017-12-05 DIAGNOSIS — M999 Biomechanical lesion, unspecified: Secondary | ICD-10-CM | POA: Diagnosis not present

## 2017-12-05 NOTE — Assessment & Plan Note (Signed)
Decision today to treat with OMT was based on Physical Exam  After verbal consent patient was treated with HVLA, ME, FPR techniques in cervical, thoracic, lumbar and sacral areas  Patient tolerated the procedure well with improvement in symptoms  Patient given exercises, stretches and lifestyle modifications  See medications in patient instructions if given  Patient will follow up in 4 weeks 

## 2017-12-05 NOTE — Patient Instructions (Signed)
Good to see you  Ice is your friend Lets watch  See me again in 3-4 weeks.

## 2017-12-05 NOTE — Assessment & Plan Note (Signed)
Continues to be chronic.  Does respond well to osteopathic manipulation.  Patient declined any type of medications at this time.  We discussed the possibility of trigger points which she also declined.  Patient is going to try massage and continue with the home exercises.  We discussed also possible labs or any type of imaging workup which patient declined today.  Patient will follow up with me again in 3 weeks.  Worsening symptoms he will call immediately or go to the emergency room.  I believe that he will do well.

## 2018-01-05 ENCOUNTER — Encounter: Payer: Self-pay | Admitting: Family Medicine

## 2018-01-05 ENCOUNTER — Ambulatory Visit: Payer: BC Managed Care – PPO | Admitting: Family Medicine

## 2018-01-05 VITALS — BP 150/80 | HR 67 | Ht 67.0 in | Wt 217.0 lb

## 2018-01-05 DIAGNOSIS — M999 Biomechanical lesion, unspecified: Secondary | ICD-10-CM | POA: Diagnosis not present

## 2018-01-05 DIAGNOSIS — M533 Sacrococcygeal disorders, not elsewhere classified: Secondary | ICD-10-CM | POA: Diagnosis not present

## 2018-01-05 NOTE — Progress Notes (Signed)
[name] [place], [place] [address] Phone: [phone] Subjective:     CC: Low back pain follow-up  [id number]  Lucas Young is a 62 y.o. male coming in with complaint of low back pain.  Has had this for quite some time.  Continues to eat yogurt fairly regularly.  Trying to do core strengthening exercises we have given him.  Taking the over-the-counter natural supplementations.  Patient has responded well to osteopathic manipulation.  Patient states some mild tightness recently.  Seems to be in the mid back.  Denies any numbness or tingling.     Past Medical History:  Diagnosis Date  . Allergy    Past Surgical History:  Procedure Laterality Date  . APPENDECTOMY     Social History   Socioeconomic History  . Marital status: Married    Spouse name: Not on file  . Number of children: Not on file  . Years of education: Not on file  . Highest education level: Not on file  Social Needs  . Financial resource strain: Not on file  . Food insecurity - worry: Not on file  . Food insecurity - inability: Not on file  . Transportation needs - medical: Not on file  . Transportation needs - non-medical: Not on file  Occupational History  . Not on file  Tobacco Use  . Smoking status: Never Smoker  . Smokeless tobacco: Never Used  Substance and Sexual Activity  . Alcohol use: No    Alcohol/week: 0.0 oz  . Drug use: No  . Sexual activity: Not on file  Other Topics Concern  . Not on file  Social History Narrative  . Not on file   No Known Allergies Family History  Problem Relation Age of Onset  . Hypertension Mother   . Hypertension Father   . Hyperlipidemia Father   . Hypertension Maternal Grandmother   . Hypertension Maternal Grandfather      Past medical history, social, surgical and family history all reviewed in electronic medical record.  No pertanent information unless stated regarding to the chief  complaint.   Review of Systems:Review of systems updated and as accurate as of 01/05/18  No headache, visual changes, nausea, vomiting, diarrhea, constipation, dizziness, abdominal pain, skin rash, fevers, chills, night sweats, weight loss, swollen lymph nodes, body aches, joint swelling, muscle aches, chest pain, shortness of breath, mood changes.   Objective  There were no vitals taken for this visit. Systems examined below as of 01/05/18   General: No apparent distress alert and oriented x3 mood and affect normal, dressed appropriately.  HEENT: Pupils equal, extraocular movements intact  Respiratory: Patient's speak in full sentences and does not appear short of breath  Cardiovascular: No lower extremity edema, non tender, no erythema  Skin: Warm dry intact with no signs of infection or rash on extremities or on axial skeleton.  Abdomen: Soft nontender  Neuro: Cranial nerves II through XII are intact, neurovascularly intact in all extremities with 2+ DTRs and 2+ pulses.  Lymph: No lymphadenopathy of posterior or anterior cervical chain or axillae bilaterally.  Gait normal with good balance and coordination.  MSK:  Non tender with full range of motion and good stability and symmetric strength and tone of shoulders, elbows, wrist, hip, knee and ankles bilaterally.  Back Exam:  Inspection: Unremarkable  Motion: Flexion 35 deg, Extension 25 deg, Side Bending to 35 deg bilaterally,  Rotation to 35 deg bilaterally  SLR laying: Negative  XSLR laying: Negative  Palpable tenderness: Tender to palpation in the paraspinal musculature mostly in the thoracolumbar juncture on the left side.Marland Kitchen FABER: Mild tightness on the right. Sensory change: Gross sensation intact to all lumbar and sacral dermatomes.  Reflexes: 2+ at both patellar tendons, 2+ at achilles tendons, Babinski's downgoing.  Strength at foot  Plantar-flexion: 5/5 Dorsi-flexion: 5/5 Eversion: 5/5 Inversion: 5/5  Leg strength  Quad:  5/5 Hamstring: 5/5 Hip flexor: 5/5 Hip abductors: 4/5 and symmetric Gait unremarkable.  Osteopathic findings C2 flexed rotated and side bent right C4 flexed rotated and side bent left C7 flexed rotated and side bent left T3 extended rotated and side bent right inhaled third rib T6 extended rotated and side bent left L2 flexed rotated and side bent right Sacrum right on right     Impression and Recommendations:     This case required medical decision making of moderate complexity.      Note: This dictation was prepared with Dragon dictation along with smaller phrase technology. Any transcriptional errors that result from this process are unintentional.

## 2018-01-05 NOTE — Patient Instructions (Signed)
Good to see you  Lucas Young is your friend.  Watch the diet  See me again in 3 weeks

## 2018-01-05 NOTE — Assessment & Plan Note (Signed)
Some pain here for more pain in the thoracolumbar junction.  Patient has had difficulty with weight loss.  Continues to yoga.  Because of the mild increase in stiffness and tightness I would like to see patient back in 3 weeks to make sure no radicular symptoms occur.

## 2018-01-05 NOTE — Assessment & Plan Note (Signed)
Decision today to treat with OMT was based on Physical Exam  After verbal consent patient was treated with HVLA, ME, FPR techniques in cervical, thoracic, rib lumbar and sacral areas  Patient tolerated the procedure well with improvement in symptoms  Patient given exercises, stretches and lifestyle modifications  See medications in patient instructions if given  Patient will follow up in 3 weeks

## 2018-01-24 NOTE — Progress Notes (Signed)
[name] [place], [place] [address] Phone: [phone] Subjective:    I'm seeing this patient by the request  of:    CC: Back pain follow-up  [id number]  Lucas Young is a 62 y.o. male coming in with complaint of seems to have back pain secondary to more of the sacroiliac dysfunction.  Patient has had a cold recently with a cough.  Patient thinks that that is what is contributing to most of the discomfort and pain.  We discussed icing regimen and home exercises.  Patient has been doing that fairly regularly though.  Thinks the cough is caused more of the discomfort though on the right side of the back.      Past Medical History:  Diagnosis Date  . Allergy    Past Surgical History:  Procedure Laterality Date  . APPENDECTOMY     Social History   Socioeconomic History  . Marital status: Married    Spouse name: Not on file  . Number of children: Not on file  . Years of education: Not on file  . Highest education level: Not on file  Social Needs  . Financial resource strain: Not on file  . Food insecurity - worry: Not on file  . Food insecurity - inability: Not on file  . Transportation needs - medical: Not on file  . Transportation needs - non-medical: Not on file  Occupational History  . Not on file  Tobacco Use  . Smoking status: Never Smoker  . Smokeless tobacco: Never Used  Substance and Sexual Activity  . Alcohol use: No    Alcohol/week: 0.0 oz  . Drug use: No  . Sexual activity: Not on file  Other Topics Concern  . Not on file  Social History Narrative  . Not on file   No Known Allergies Family History  Problem Relation Age of Onset  . Hypertension Mother   . Hypertension Father   . Hyperlipidemia Father   . Hypertension Maternal Grandmother   . Hypertension Maternal Grandfather      Past medical history, social, surgical and family history all reviewed in electronic medical record.  No  pertanent information unless stated regarding to the chief complaint.   Review of Systems:Review of systems updated and as accurate as of 01/24/18  No headache, visual changes, nausea, vomiting, diarrhea, constipation, dizziness, abdominal pain, skin rash, fevers, chills, night sweats, weight loss, swollen lymph nodes, body aches, joint swelling,  chest pain, shortness of breath, mood changes.  Positive cough and muscle aches  Objective  There were no vitals taken for this visit. Systems examined below as of 01/24/18   General: No apparent distress alert and oriented x3 mood and affect normal, dressed appropriately.  HEENT: Pupils equal, extraocular movements intact  Respiratory: Patient's speak in full sentences and does not appear short of breath  Cardiovascular: No lower extremity edema, non tender, no erythema  Skin: Warm dry intact with no signs of infection or rash on extremities or on axial skeleton.  Abdomen: Soft nontender  Neuro: Cranial nerves II through XII are intact, neurovascularly intact in all extremities with 2+ DTRs and 2+ pulses.  Lymph: No lymphadenopathy of posterior or anterior cervical chain or axillae bilaterally.  Gait normal with good balance and coordination.  MSK:  Non tender with full range of motion and good stability and symmetric strength and tone of shoulders, elbows, wrist, hip, knee and ankles bilaterally.  Back Exam:  Inspection: Unremarkable  Motion: Flexion 45 deg, Extension 25 deg, Side Bending to 30 deg bilaterally,  Rotation to 30 deg bilaterally  SLR laying: Negative  XSLR laying: Negative  Palpable tenderness: Tender to palpation over the thoracolumbar junction FABER: negative. Sensory change: Gross sensation intact to all lumbar and sacral dermatomes.  Reflexes: 2+ at both patellar tendons, 2+ at achilles tendons, Babinski's downgoing.  Strength at foot  Plantar-flexion: 5/5 Dorsi-flexion: 5/5 Eversion: 5/5 Inversion: 5/5  Leg strength    Quad: 5/5 Hamstring: 5/5 Hip flexor: 5/5 Hip abductors: 5/5  Gait unremarkable. Osteopathic findings C6 flexed rotated and side bent left T3 extended rotated and side bent right inhaled third rib T9 extended rotated and side bent left L3 flexed rotated and side bent right Sacrum right on right     Impression and Recommendations:     This case required medical decision making of moderate complexity.      Note: This dictation was prepared with Dragon dictation along with smaller phrase technology. Any transcriptional errors that result from this process are unintentional.

## 2018-01-26 ENCOUNTER — Ambulatory Visit: Payer: BC Managed Care – PPO | Admitting: Family Medicine

## 2018-01-26 ENCOUNTER — Encounter: Payer: Self-pay | Admitting: Family Medicine

## 2018-01-26 VITALS — BP 140/90 | HR 85 | Ht 67.0 in | Wt 224.0 lb

## 2018-01-26 DIAGNOSIS — M999 Biomechanical lesion, unspecified: Secondary | ICD-10-CM

## 2018-01-26 DIAGNOSIS — M533 Sacrococcygeal disorders, not elsewhere classified: Secondary | ICD-10-CM | POA: Diagnosis not present

## 2018-01-26 MED ORDER — ALBUTEROL SULFATE HFA 108 (90 BASE) MCG/ACT IN AERS: 2.0000 | INHALATION_SPRAY | RESPIRATORY_TRACT | 6 refills | Status: DC | PRN

## 2018-01-26 NOTE — Assessment & Plan Note (Signed)
Stable overall.  Discussed icing regimen and home exercises.  We discussed which activities of doing which wants to avoid.  Discussed icing regimen and home exercises.  Follow-up again in 4 weeks

## 2018-01-26 NOTE — Patient Instructions (Signed)
I hope it helps Sent in inhaler can do 2 puffs every 8 hours if you really need it but try just a couple times.  Vitamin C 1000mg  daily for next week  See me again in 4 weeks

## 2018-01-26 NOTE — Assessment & Plan Note (Signed)
Decision today to treat with OMT was based on Physical Exam  After verbal consent patient was treated with HVLA, ME, FPR techniques in cervical, thoracic, lumbar and sacral areas  Patient tolerated the procedure well with improvement in symptoms  Patient given exercises, stretches and lifestyle modifications  See medications in patient instructions if given  Patient will follow up in 4 weeks 

## 2018-02-25 NOTE — Progress Notes (Signed)
[name] [place], [place] [address] Phone: [phone] Subjective:     CC: Back pain follow-up  [id number]  Lucas Young is a 62 y.o. male coming in with complaint of back pain follow-up patient has been doing relatively well.  Did do a lot of the yard work recently with some mild tightness.  Nothing severe.  States that has made improvement overall but still just as the tightness on the left lower back.      Past Medical History:  Diagnosis Date  . Allergy    Past Surgical History:  Procedure Laterality Date  . APPENDECTOMY     Social History   Socioeconomic History  . Marital status: Married    Spouse name: Not on file  . Number of children: Not on file  . Years of education: Not on file  . Highest education level: Not on file  Occupational History  . Not on file  Social Needs  . Financial resource strain: Not on file  . Food insecurity:    Worry: Not on file    Inability: Not on file  . Transportation needs:    Medical: Not on file    Non-medical: Not on file  Tobacco Use  . Smoking status: Never Smoker  . Smokeless tobacco: Never Used  Substance and Sexual Activity  . Alcohol use: No    Alcohol/week: 0.0 oz  . Drug use: No  . Sexual activity: Not on file  Lifestyle  . Physical activity:    Days per week: Not on file    Minutes per session: Not on file  . Stress: Not on file  Relationships  . Social connections:    Talks on phone: Not on file    Gets together: Not on file    Attends religious service: Not on file    Active member of club or organization: Not on file    Attends meetings of clubs or organizations: Not on file    Relationship status: Not on file  Other Topics Concern  . Not on file  Social History Narrative  . Not on file   No Known Allergies Family History  Problem Relation Age of Onset  . Hypertension Mother   . Hypertension Father   . Hyperlipidemia Father   .  Hypertension Maternal Grandmother   . Hypertension Maternal Grandfather      Past medical history, social, surgical and family history all reviewed in electronic medical record.  No pertanent information unless stated regarding to the chief complaint.   Review of Systems:Review of systems updated and as accurate as of 02/26/18  No headache, visual changes, nausea, vomiting, diarrhea, constipation, dizziness, abdominal pain, skin rash, fevers, chills, night sweats, weight loss, swollen lymph nodes, body aches, joint swelling,  chest pain, shortness of breath, mood changes.  Positive muscle aches  Objective  Blood pressure (!) 134/100, pulse 81, height 5\' 7"  (1.702 m), weight 220 lb (99.8 kg), SpO2 93 %. Systems examined below as of 02/26/18   General: No apparent distress alert and oriented x3 mood and affect normal, dressed appropriately.  HEENT: Pupils equal, extraocular movements intact  Respiratory: Patient's speak in full sentences and does not appear short of breath  Cardiovascular: No lower extremity edema, non tender, no erythema  Skin: Warm dry intact with no signs of infection or rash on extremities or on axial skeleton.  Abdomen: Soft nontender  Neuro: Cranial nerves II through XII are intact, neurovascularly intact  in all extremities with 2+ DTRs and 2+ pulses.  Lymph: No lymphadenopathy of posterior or anterior cervical chain or axillae bilaterally.  Gait normal with good balance and coordination.  MSK:  Non tender with full range of motion and good stability and symmetric strength and tone of shoulders, elbows, wrist, hip, knee and ankles bilaterally.   Back Exam:  Inspection: Loss of lordosis Motion: Flexion 45 deg, Extension 25 deg, Side Bending to 35 deg bilaterally,  Rotation to 45 deg bilaterally  SLR laying: Negative  XSLR laying: Negative  Palpable tenderness: Tender to palpation. FABER: negative. Sensory change: Gross sensation intact to all lumbar and sacral  dermatomes.  Reflexes: 2+ at both patellar tendons, 2+ at achilles tendons, Babinski's downgoing.  Strength at foot  Plantar-flexion: 5/5 Dorsi-flexion: 5/5 Eversion: 5/5 Inversion: 5/5  Leg strength  Quad: 5/5 Hamstring: 5/5 Hip flexor: 5/5 Hip abductors: 5/5  Gait unremarkable.  Osteopathic findings C2 flexed rotated and side bent right C4 flexed rotated and side bent left C7 flexed rotated and side bent left T3 extended rotated and side bent right inhaled third rib T5 extended rotated and side bent left L2 flexed rotated and side bent right Sacrum right on right      Impression and Recommendations:     This case required medical decision making of moderate complexity.      Note: This dictation was prepared with Dragon dictation along with smaller phrase technology. Any transcriptional errors that result from this process are unintentional.

## 2018-02-26 ENCOUNTER — Ambulatory Visit: Payer: BC Managed Care – PPO | Admitting: Family Medicine

## 2018-02-26 ENCOUNTER — Encounter: Payer: Self-pay | Admitting: Family Medicine

## 2018-02-26 VITALS — BP 134/100 | HR 81 | Ht 67.0 in | Wt 220.0 lb

## 2018-02-26 DIAGNOSIS — M999 Biomechanical lesion, unspecified: Secondary | ICD-10-CM | POA: Diagnosis not present

## 2018-02-26 DIAGNOSIS — M533 Sacrococcygeal disorders, not elsewhere classified: Secondary | ICD-10-CM | POA: Diagnosis not present

## 2018-02-26 NOTE — Assessment & Plan Note (Signed)
Decision today to treat with OMT was based on Physical Exam  After verbal consent patient was treated with HVLA, ME, FPR techniques in cervical, thoracic, lumbar and sacral areas  Patient tolerated the procedure well with improvement in symptoms  Patient given exercises, stretches and lifestyle modifications  See medications in patient instructions if given  Patient will follow up in 5-6 weeks

## 2018-02-26 NOTE — Patient Instructions (Addendum)
Good to see you  See me again in 5-6 weeks  Pretty good overall

## 2018-02-26 NOTE — Assessment & Plan Note (Signed)
Continues to have muscle imbalances.  Discussed with patient about working on core strength.  Patient will do more isometrics.  Has been doing yoga on a fairly regular basis.  We discussed icing regimen and home exercises.  Patient will try to attempt to lose weight, increase activities, and follow-up with me again in 5-6 weeks

## 2018-04-08 NOTE — Progress Notes (Signed)
[name] [place], [place] [address] Phone: [phone] Subjective:    I'm seeing this patient by the request  of:    CC: Neck and back pain  [id number]  Lucas Young is a 62 y.o. male coming in with complaint of neck and back pain.  Patient has been seen multiple times.  Has responded fairly well to osteopathic manipulation.  Has had some increasing stress.  Noticing blood pressure going up.  Patient states that it is in the process of moving father to an assisted living facility as well as had significant stress problems with sleeping.     Past Medical History:  Diagnosis Date  . Allergy    Past Surgical History:  Procedure Laterality Date  . APPENDECTOMY     Social History   Socioeconomic History  . Marital status: Married    Spouse name: Not on file  . Number of children: Not on file  . Years of education: Not on file  . Highest education level: Not on file  Occupational History  . Not on file  Social Needs  . Financial resource strain: Not on file  . Food insecurity:    Worry: Not on file    Inability: Not on file  . Transportation needs:    Medical: Not on file    Non-medical: Not on file  Tobacco Use  . Smoking status: Never Smoker  . Smokeless tobacco: Never Used  Substance and Sexual Activity  . Alcohol use: No    Alcohol/week: 0.0 oz  . Drug use: No  . Sexual activity: Not on file  Lifestyle  . Physical activity:    Days per week: Not on file    Minutes per session: Not on file  . Stress: Not on file  Relationships  . Social connections:    Talks on phone: Not on file    Gets together: Not on file    Attends religious service: Not on file    Active member of club or organization: Not on file    Attends meetings of clubs or organizations: Not on file    Relationship status: Not on file  Other Topics Concern  . Not on file  Social History Narrative  . Not on file   No Known  Allergies Family History  Problem Relation Age of Onset  . Hypertension Mother   . Hypertension Father   . Hyperlipidemia Father   . Hypertension Maternal Grandmother   . Hypertension Maternal Grandfather      Past medical history, social, surgical and family history all reviewed in electronic medical record.  No pertanent information unless stated regarding to the chief complaint.   Review of Systems:Review of systems updated and as accurate as of 04/09/18  No headache, visual changes, nausea, vomiting, diarrhea, constipation, dizziness, abdominal pain, skin rash, fevers, chills, night sweats, weight loss, swollen lymph nodes, body aches, joint swelling, chest pain, shortness of breath, mood changes.  Positive muscle aches  Objective  Blood pressure (!) 158/90, pulse 69, height 5\' 7"  (1.702 m), weight 222 lb (100.7 kg), SpO2 97 %. Systems examined below as of 04/09/18   General: No apparent distress alert and oriented x3 mood and affect normal, dressed appropriately.  HEENT: Pupils equal, extraocular movements intact  Respiratory: Patient's speak in full sentences and does not appear short of breath  Cardiovascular: No lower extremity edema, non tender, no erythema  Skin: Warm dry intact with no signs of infection  or rash on extremities or on axial skeleton.  Abdomen: Soft nontender  Neuro: Cranial nerves II through XII are intact, neurovascularly intact in all extremities with 2+ DTRs and 2+ pulses.  Lymph: No lymphadenopathy of posterior or anterior cervical chain or axillae bilaterally.  Gait normal with good balance and coordination.  MSK:  Non tender with full range of motion and good stability and symmetric strength and tone of shoulders, elbows, wrist, hip, knee and ankles bilaterally.  Back Exam:  Inspection: loss of lordosis  Motion: Flexion 35 deg, Extension 25 deg, Side Bending to 35 deg bilaterally,  Rotation to 35 deg bilaterally  SLR laying: Negative  XSLR laying:  Negative  Palpable tenderness: Tender to palpation the paraspinal musculature. FABER: Tightness bilaterally. Sensory change: Gross sensation intact to all lumbar and sacral dermatomes.  Reflexes: 2+ at both patellar tendons, 2+ at achilles tendons, Babinski's downgoing.  Strength at foot  Plantar-flexion: 5/5 Dorsi-flexion: 5/5 Eversion: 5/5 Inversion: 5/5  Leg strength  Quad: 5/5 Hamstring: 5/5 Hip flexor: 5/5 Hip abductors: 5/5  Gait unremarkable.  Osteopathic findings C2 flexed rotated and side bent right C4 flexed rotated and side bent left C6 flexed rotated and side bent left T3 extended rotated and side bent right inhaled third rib L2 flexed rotated and side bent right Sacrum right on right    Impression and Recommendations:     This case required medical decision making of moderate complexity.      Note: This dictation was prepared with Dragon dictation along with smaller phrase technology. Any transcriptional errors that result from this process are unintentional.

## 2018-04-09 ENCOUNTER — Encounter: Payer: Self-pay | Admitting: Family Medicine

## 2018-04-09 ENCOUNTER — Ambulatory Visit: Payer: BC Managed Care – PPO | Admitting: Family Medicine

## 2018-04-09 VITALS — BP 158/90 | HR 69 | Ht 67.0 in | Wt 222.0 lb

## 2018-04-09 DIAGNOSIS — M533 Sacrococcygeal disorders, not elsewhere classified: Secondary | ICD-10-CM

## 2018-04-09 DIAGNOSIS — M999 Biomechanical lesion, unspecified: Secondary | ICD-10-CM

## 2018-04-09 MED ORDER — TRAZODONE HCL 50 MG PO TABS: 50.0000 mg | ORAL_TABLET | Freq: Every evening | ORAL | 3 refills | Status: DC | PRN

## 2018-04-09 MED ORDER — HYDROXYZINE HCL 10 MG PO TABS: 10.0000 mg | ORAL_TABLET | Freq: Three times a day (TID) | ORAL | 0 refills | Status: DC | PRN

## 2018-04-09 NOTE — Assessment & Plan Note (Signed)
Patient responded well to manipulation again today.  I do believe that underlying anxiety is contributing to some of the discomfort and pain as well.  We discussed icing regimen and home exercise.  Discussed which activities to do which wants to avoid.  Due to some of the anxiety issues patient was given medications today.

## 2018-04-09 NOTE — Patient Instructions (Signed)
Good to see you  I am sorry with everything gooing on.  Hydroxyzine up to 3 times a day as needed for anxiety  Trazodone at night See me again in 3 weeks

## 2018-04-09 NOTE — Assessment & Plan Note (Signed)
Decision today to treat with OMT was based on Physical Exam  After verbal consent patient was treated with HVLA, ME, FPR techniques in cervical, thoracic, lumbar and sacral areas  Patient tolerated the procedure well with improvement in symptoms  Patient given exercises, stretches and lifestyle modifications  See medications in patient instructions if given  Patient will follow up in 3-4 weeks  

## 2018-05-01 NOTE — Progress Notes (Signed)
[name] [place], [place] [address] Phone: [phone] Subjective:     CC: Back and neck pain follow-up  IOE:[id number]  Lucas Young is a 62 y.o. male coming in with complaint of back pain. Patient has been having a flare up in the right thoracic spine. Has been doing yoga and stretching out lats and thoracic spine area. Is under a lot of stress with taking care of ailing father.      Past Medical History:  Diagnosis Date  . Allergy    Past Surgical History:  Procedure Laterality Date  . APPENDECTOMY     Social History   Socioeconomic History  . Marital status: Married    Spouse name: Not on file  . Number of children: Not on file  . Years of education: Not on file  . Highest education level: Not on file  Occupational History  . Not on file  Social Needs  . Financial resource strain: Not on file  . Food insecurity:    Worry: Not on file    Inability: Not on file  . Transportation needs:    Medical: Not on file    Non-medical: Not on file  Tobacco Use  . Smoking status: Never Smoker  . Smokeless tobacco: Never Used  Substance and Sexual Activity  . Alcohol use: No    Alcohol/week: 0.0 oz  . Drug use: No  . Sexual activity: Not on file  Lifestyle  . Physical activity:    Days per week: Not on file    Minutes per session: Not on file  . Stress: Not on file  Relationships  . Social connections:    Talks on phone: Not on file    Gets together: Not on file    Attends religious service: Not on file    Active member of club or organization: Not on file    Attends meetings of clubs or organizations: Not on file    Relationship status: Not on file  Other Topics Concern  . Not on file  Social History Narrative  . Not on file   No Known Allergies Family History  Problem Relation Age of Onset  . Hypertension Mother   . Hypertension Father   . Hyperlipidemia Father   . Hypertension Maternal  Grandmother   . Hypertension Maternal Grandfather      Past medical history, social, surgical and family history all reviewed in electronic medical record.  No pertanent information unless stated regarding to the chief complaint.   Review of Systems:Review of systems updated and as accurate as of 05/02/18  No  visual changes, nausea, vomiting, diarrhea, constipation, dizziness, abdominal pain, skin rash, fevers, chills, night sweats, weight loss, swollen lymph nodes,  chest pain, shortness of breath, mood changes.  Positive headaches, muscle aches and body aches  Objective  Blood pressure (!) 124/98, pulse 69, height 5\' 7"  (1.702 m), weight 222 lb (100.7 kg), SpO2 96 %. Systems examined below as of 05/02/18   General: No apparent distress alert and oriented x3 mood and affect normal, dressed appropriately.  HEENT: Pupils equal, extraocular movements intact  Respiratory: Patient's speak in full sentences and does not appear short of breath  Cardiovascular: No lower extremity edema, non tender, no erythema  Skin: Warm dry intact with no signs of infection or rash on extremities or on axial skeleton.  Abdomen: Soft nontender  Neuro: Cranial nerves II through XII are intact, neurovascularly intact in all extremities with  2+ DTRs and 2+ pulses.  Lymph: No lymphadenopathy of posterior or anterior cervical chain or axillae bilaterally.  Gait normal with good balance and coordination.  MSK:  Non tender with full range of motion and good stability and symmetric strength and tone of shoulders, elbows, wrist, hip, knee and ankles bilaterally.  Neck: Inspection mild loss of lordosis. No palpable stepoffs. Negative Spurling's maneuver. Full neck range of motion Grip strength and sensation normal in bilateral hands Strength good C4 to T1 distribution No sensory change to C4 to T1 Negative Hoffman sign bilaterally Reflexes normal Tightness of the trapezius bilaterally  Back Exam:  Inspection:  Mild loss of lordosis Motion: Flexion 45 deg, Extension 25 deg, Side Bending to 35 deg bilaterally,  Rotation to 45 deg bilaterally  SLR laying: Negative  XSLR laying: Negative  Palpable tenderness: Tender to palpation mostly in the thoracolumbar juncture on the left. FABER: Positive right. Sensory change: Gross sensation intact to all lumbar and sacral dermatomes.  Reflexes: 2+ at both patellar tendons, 2+ at achilles tendons, Babinski's downgoing.  Strength at foot  Plantar-flexion: 5/5 Dorsi-flexion: 5/5 Eversion: 5/5 Inversion: 5/5  Leg strength  Quad: 5/5 Hamstring: 5/5 Hip flexor: 5/5 Hip abductors: 5/5  Gait unremarkable.  Osteopathic findings  C6 flexed rotated and side bent left T3 extended rotated and side bent right inhaled third rib T11 extended rotated and side bent left L2 flexed rotated and side bent right Sacrum right on right    Impression and Recommendations:     This case required medical decision making of moderate complexity.      Note: This dictation was prepared with Dragon dictation along with smaller phrase technology. Any transcriptional errors that result from this process are unintentional.

## 2018-05-02 ENCOUNTER — Ambulatory Visit: Payer: BC Managed Care – PPO | Admitting: Family Medicine

## 2018-05-02 ENCOUNTER — Encounter: Payer: Self-pay | Admitting: Family Medicine

## 2018-05-02 VITALS — BP 124/98 | HR 69 | Ht 67.0 in | Wt 222.0 lb

## 2018-05-02 DIAGNOSIS — M999 Biomechanical lesion, unspecified: Secondary | ICD-10-CM | POA: Diagnosis not present

## 2018-05-02 DIAGNOSIS — M533 Sacrococcygeal disorders, not elsewhere classified: Secondary | ICD-10-CM

## 2018-05-02 NOTE — Patient Instructions (Signed)
Good to see you  Lucas Young is your friend.  2 glasses of water right when you wake up, 1 glass with every meal and eat every 2 hours  No carbs after 6  Bromelain 2400 IU with every meal  DHEA 50 mg daily for 1 month  See me again in 3 weeks

## 2018-05-02 NOTE — Assessment & Plan Note (Signed)
Stable.  Discussed with patient about posture and ergonomics.  Discussed which activities of doing which was to avoid.  I do believe that underlying stress is playing a role.  Patient responds well to manipulation.  Follow-up again in 3 weeks no change in medications

## 2018-05-02 NOTE — Assessment & Plan Note (Signed)
Decision today to treat with OMT was based on Physical Exam  After verbal consent patient was treated with HVLA, ME, FPR techniques in cervical, thoracic, lumbar and sacral areas  Patient tolerated the procedure well with improvement in symptoms  Patient given exercises, stretches and lifestyle modifications  See medications in patient instructions if given  Patient will follow up in 3 weeks 

## 2018-05-22 NOTE — Progress Notes (Signed)
[name] [place], [place] [address] Phone: [phone] Subjective:    CC: Back pain  EHU:[id number]  Lucas Young is a 62 y.o. male coming in with complaint of back pain.  Patient has been doing relatively well.  Was having some increased anxiety at last office visit.  Patient has been taking hydroxyzine during the day and notices a little bit of improvement and feels that the trazodone has helped out significantly with the nighttime.      Past Medical History:  Diagnosis Date  . Allergy    Past Surgical History:  Procedure Laterality Date  . APPENDECTOMY     Social History   Socioeconomic History  . Marital status: Married    Spouse name: Not on file  . Number of children: Not on file  . Years of education: Not on file  . Highest education level: Not on file  Occupational History  . Not on file  Social Needs  . Financial resource strain: Not on file  . Food insecurity:    Worry: Not on file    Inability: Not on file  . Transportation needs:    Medical: Not on file    Non-medical: Not on file  Tobacco Use  . Smoking status: Never Smoker  . Smokeless tobacco: Never Used  Substance and Sexual Activity  . Alcohol use: No    Alcohol/week: 0.0 oz  . Drug use: No  . Sexual activity: Not on file  Lifestyle  . Physical activity:    Days per week: Not on file    Minutes per session: Not on file  . Stress: Not on file  Relationships  . Social connections:    Talks on phone: Not on file    Gets together: Not on file    Attends religious service: Not on file    Active member of club or organization: Not on file    Attends meetings of clubs or organizations: Not on file    Relationship status: Not on file  Other Topics Concern  . Not on file  Social History Narrative  . Not on file   No Known Allergies Family History  Problem Relation Age of Onset  . Hypertension Mother   . Hypertension Father   .  Hyperlipidemia Father   . Hypertension Maternal Grandmother   . Hypertension Maternal Grandfather      Past medical history, social, surgical and family history all reviewed in electronic medical record.  No pertanent information unless stated regarding to the chief complaint.   Review of Systems:Review of systems updated and as accurate as of 05/23/18  No headache, visual changes, nausea, vomiting, diarrhea, constipation, dizziness, abdominal pain, skin rash, fevers, chills, night sweats, weight loss, swollen lymph nodes, body aches, joint swelling, muscle aches, chest pain, shortness of breath, mood changes.   Objective  Blood pressure (!) 146/80, pulse 65, height 5\' 7"  (1.702 m), weight 218 lb (98.9 kg), SpO2 98 %. Systems examined below as of 05/23/18   General: No apparent distress alert and oriented x3 mood and affect normal, dressed appropriately.  HEENT: Pupils equal, extraocular movements intact  Respiratory: Patient's speak in full sentences and does not appear short of breath  Cardiovascular: No lower extremity edema, non tender, no erythema  Skin: Warm dry intact with no signs of infection or rash on extremities or on axial skeleton.  Abdomen: Soft nontender  Neuro: Cranial nerves II through XII are intact, neurovascularly intact in  all extremities with 2+ DTRs and 2+ pulses.  Lymph: No lymphadenopathy of posterior or anterior cervical chain or axillae bilaterally.  Gait normal with good balance and coordination.  MSK:  Non tender with full range of motion and good stability and symmetric strength and tone of shoulders, elbows, wrist, hip, knee and ankles bilaterally.   Back Exam:  Inspection: Unremarkable  Motion: Flexion 45 deg, Extension 45 deg, Side Bending to 45 deg bilaterally,  Rotation to 45 deg bilaterally  SLR laying: Negative  XSLR laying: Negative  Palpable tenderness: More tenderness on the left side of the thoracolumbar juncture. FABER: negative. Sensory  change: Gross sensation intact to all lumbar and sacral dermatomes.  Reflexes: 2+ at both patellar tendons, 2+ at achilles tendons, Babinski's downgoing.  Strength at foot  Plantar-flexion: 5/5 Dorsi-flexion: 5/5 Eversion: 5/5 Inversion: 5/5  Leg strength  Quad: 5/5 Hamstring: 5/5 Hip flexor: 5/5 Hip abductors: 5/5  Gait unremarkable.  Osteopathic findings  C2 flexed rotated and side bent right C4 flexed rotated and side bent left C6 flexed rotated and side bent left T3 extended rotated and side bent right inhaled third rib T11 extended rotated and side bent left L2 flexed rotated and side bent right Sacrum right on right    Impression and Recommendations:     This case required medical decision making of moderate complexity.      Note: This dictation was prepared with Dragon dictation along with smaller phrase technology. Any transcriptional errors that result from this process are unintentional.

## 2018-05-23 ENCOUNTER — Encounter: Payer: Self-pay | Admitting: Family Medicine

## 2018-05-23 ENCOUNTER — Ambulatory Visit: Payer: BC Managed Care – PPO | Admitting: Family Medicine

## 2018-05-23 VITALS — BP 146/80 | HR 65 | Ht 67.0 in | Wt 218.0 lb

## 2018-05-23 DIAGNOSIS — M999 Biomechanical lesion, unspecified: Secondary | ICD-10-CM

## 2018-05-23 DIAGNOSIS — M545 Low back pain, unspecified: Secondary | ICD-10-CM

## 2018-05-23 NOTE — Assessment & Plan Note (Signed)
Decision today to treat with OMT was based on Physical Exam  After verbal consent patient was treated with HVLA, ME, FPR techniques in cervical, thoracic, rib lumbar and sacral areas  Patient tolerated the procedure well with improvement in symptoms  Patient given exercises, stretches and lifestyle modifications  See medications in patient instructions if given  Patient will follow up in 4-6 weeks 

## 2018-05-23 NOTE — Patient Instructions (Signed)
Good to see you  Hydroxyzine up to 2 during the day is fine  continue the trazadone at night when needed as well  See me again when you get back

## 2018-05-23 NOTE — Assessment & Plan Note (Signed)
Tender to palpation the paraspinal muscular lumbar spine right greater than left discussed icing regimen and home exercises patient did respond to manipulation.  Patient will continue same medications.  Follow-up in 4 to 6 weeks

## 2018-06-29 NOTE — Progress Notes (Signed)
[name] [place], [place] [address] Phone: [phone] Subjective:       CC: Hip pain  [id number]  Lucas Young is a 62 y.o. male coming in with complaint of hip pain.  States that today he feels good. Patient has had some back and hip pain for quite some time.  Seems to be more sacroiliac joint.  Patient's father did die in July which has actually alleviate some of the stress that he was having with him being in poor health.  Patient is also taking a sabbatical from work which he thinks is going to be very helpful.  Just got back from vacation and is feeling better overall      Past Medical History:  Diagnosis Date  . Allergy    Past Surgical History:  Procedure Laterality Date  . APPENDECTOMY     Social History   Socioeconomic History  . Marital status: Married    Spouse name: Not on file  . Number of children: Not on file  . Years of education: Not on file  . Highest education level: Not on file  Occupational History  . Not on file  Social Needs  . Financial resource strain: Not on file  . Food insecurity:    Worry: Not on file    Inability: Not on file  . Transportation needs:    Medical: Not on file    Non-medical: Not on file  Tobacco Use  . Smoking status: Never Smoker  . Smokeless tobacco: Never Used  Substance and Sexual Activity  . Alcohol use: No    Alcohol/week: 0.0 oz  . Drug use: No  . Sexual activity: Not on file  Lifestyle  . Physical activity:    Days per week: Not on file    Minutes per session: Not on file  . Stress: Not on file  Relationships  . Social connections:    Talks on phone: Not on file    Gets together: Not on file    Attends religious service: Not on file    Active member of club or organization: Not on file    Attends meetings of clubs or organizations: Not on file    Relationship status: Not on file  Other Topics Concern  . Not on file  Social History  Narrative  . Not on file   No Known Allergies Family History  Problem Relation Age of Onset  . Hypertension Mother   . Hypertension Father   . Hyperlipidemia Father   . Hypertension Maternal Grandmother   . Hypertension Maternal Grandfather      Past medical history, social, surgical and family history all reviewed in electronic medical record.  No pertanent information unless stated regarding to the chief complaint.   Review of Systems:Review of systems updated and as accurate as of 06/29/18  No headache, visual changes, nausea, vomiting, diarrhea, constipation, dizziness, abdominal pain, skin rash, fevers, chills, night sweats, weight loss, swollen lymph nodes, body aches, joint swelling,, chest pain, shortness of breath, mood changes.  Positive muscle aches  Objective  There were no vitals taken for this visit. Systems examined below as of 06/29/18   General: No apparent distress alert and oriented x3 mood and affect normal, dressed appropriately.  HEENT: Pupils equal, extraocular movements intact  Respiratory: Patient's speak in full sentences and does not appear short of breath  Cardiovascular: No lower extremity edema, non tender, no erythema  Skin: Warm dry intact with  no signs of infection or rash on extremities or on axial skeleton.  Abdomen: Soft nontender  Neuro: Cranial nerves II through XII are intact, neurovascularly intact in all extremities with 2+ DTRs and 2+ pulses.  Lymph: No lymphadenopathy of posterior or anterior cervical chain or axillae bilaterally.  Gait normal with good balance and coordination.  MSK:  Non tender with full range of motion and good stability and symmetric strength and tone of shoulders, elbows, wrist, hip, knee and ankles bilaterally.  Back Exam:  Inspection: Mild loss of lordosis poor core strength Motion: Flexion 45 deg, Extension 20 deg, Side Bending to 35 deg bilaterally,  Rotation to 35 deg bilaterally  SLR laying: Negative  XSLR  laying: Negative  Palpable tenderness: Tender to palpation the paraspinal musculature lumbar spine left greater than right.Marland Kitchen FABER: Mild positive left. Sensory change: Gross sensation intact to all lumbar and sacral dermatomes.  Reflexes: 2+ at both patellar tendons, 2+ at achilles tendons, Babinski's downgoing.  Strength at foot  Plantar-flexion: 5/5 Dorsi-flexion: 5/5 Eversion: 5/5 Inversion: 5/5  Leg strength  Quad: 5/5 Hamstring: 5/5 Hip flexor: 5/5 Hip abductors: 5/5  Gait unremarkable.  Osteopathic findings  C2 flexed rotated and side bent right C6 flexed rotated and side bent left T3 extended rotated and side bent right inhaled third rib T6 extended rotated and side bent left L3 flexed rotated and side bent right Sacrum left on left       Impression and Recommendations:     This case required medical decision making of moderate complexity.      Note: This dictation was prepared with Dragon dictation along with smaller phrase technology. Any transcriptional errors that result from this process are unintentional.

## 2018-07-02 ENCOUNTER — Ambulatory Visit: Payer: BC Managed Care – PPO | Admitting: Family Medicine

## 2018-07-02 ENCOUNTER — Encounter: Payer: Self-pay | Admitting: Family Medicine

## 2018-07-02 VITALS — BP 140/84 | HR 57 | Ht 67.0 in | Wt 225.0 lb

## 2018-07-02 DIAGNOSIS — M533 Sacrococcygeal disorders, not elsewhere classified: Secondary | ICD-10-CM | POA: Diagnosis not present

## 2018-07-02 DIAGNOSIS — M999 Biomechanical lesion, unspecified: Secondary | ICD-10-CM | POA: Diagnosis not present

## 2018-07-02 MED ORDER — HYDROXYZINE HCL 10 MG PO TABS: 10.0000 mg | ORAL_TABLET | Freq: Three times a day (TID) | ORAL | 1 refills | Status: DC | PRN

## 2018-07-02 NOTE — Patient Instructions (Signed)
Good to see you  Sorry about your dad Jim Desanctis you are getting time to take care of yourself. See me again in 6 weeks

## 2018-07-02 NOTE — Assessment & Plan Note (Signed)
Decision today to treat with OMT was based on Physical Exam  After verbal consent patient was treated with HVLA, ME, FPR techniques in cervical, thoracic, rib, lumbar and sacral areas  Patient tolerated the procedure well with improvement in symptoms  Patient given exercises, stretches and lifestyle modifications  See medications in patient instructions if given  Patient will follow up in 4-6 weeks 

## 2018-07-02 NOTE — Assessment & Plan Note (Signed)
Stable.  Still seems to be a chronic discomfort.  Patient will be taking more time for himself that I think will be beneficial.  I did discuss with him about topical anti-inflammatories, core strengthening, and the importance of hip abductor strengthening.  Follow-up again in 4 weeks.

## 2018-08-10 NOTE — Progress Notes (Signed)
[name] [place], [place] [address] Phone: [phone] Subjective:   Lucas [name], am serving as a scribe for Dr. Hulan Saas.   CC: low back pain   JKD:[id number]  Lucas [name] is a 62 y.o. male coming in with complaint of back pain. He feels that his left thoracic and cervical spine are tight and "off" since last visit. Has responded well to OMT to manage back pain. Doing well, still having significant amount of stress though.  Patient is on sabbatical.      Past Medical History:  Diagnosis Date  . Allergy    Past Surgical History:  Procedure Laterality Date  . APPENDECTOMY     Social History   Socioeconomic History  . Marital status: Married    Spouse name: Not on file  . Number of children: Not on file  . Years of education: Not on file  . Highest education level: Not on file  Occupational History  . Not on file  Social Needs  . Financial resource strain: Not on file  . Food insecurity:    Worry: Not on file    Inability: Not on file  . Transportation needs:    Medical: Not on file    Non-medical: Not on file  Tobacco Use  . Smoking status: Never Smoker  . Smokeless tobacco: Never Used  Substance and Sexual Activity  . Alcohol use: [name]    Alcohol/week: 0.0 standard drinks  . Drug use: [name]  . Sexual activity: Not on file  Lifestyle  . Physical activity:    Days per week: Not on file    Minutes per session: Not on file  . Stress: Not on file  Relationships  . Social connections:    Talks on phone: Not on file    Gets together: Not on file    Attends religious service: Not on file    Active member of club or organization: Not on file    Attends meetings of clubs or organizations: Not on file    Relationship status: Not on file  Other Topics Concern  . Not on file  Social History Narrative  . Not on file   [name] Known Allergies Family History  Problem Relation Age of Onset  . Hypertension  Mother   . Hypertension Father   . Hyperlipidemia Father   . Hypertension Maternal Grandmother   . Hypertension Maternal Grandfather       Current Outpatient Medications (Respiratory):  .  albuterol (PROVENTIL HFA;VENTOLIN HFA) 108 (90 Base) MCG/ACT inhaler, Inhale 2 puffs into the lungs every 4 (four) hours as needed for wheezing or shortness of breath. .  Azelastine-Fluticasone (DYMISTA NA), Place into the nose. Marland Kitchen  desloratadine (CLARINEX) 5 MG tablet, Take 5 mg by mouth daily. .  montelukast (SINGULAIR) 10 MG tablet, Take 10 mg by mouth at bedtime.    Current Outpatient Medications (Other):  .  hydrOXYzine (ATARAX/VISTARIL) 10 MG tablet, Take 1 tablet (10 mg total) by mouth 3 (three) times daily as needed. .  hydrOXYzine (ATARAX/VISTARIL) 10 MG tablet, Take 1 tablet (10 mg total) by mouth 3 (three) times daily as needed. .  traZODone (DESYREL) 50 MG tablet, Take 1 tablet (50 mg total) by mouth at bedtime as needed for sleep.    Past medical history, social, surgical and family history all reviewed in electronic medical record.  [name] pertanent information unless stated regarding to the chief complaint.   Review of Systems:  [name] headache, visual changes, nausea, vomiting, diarrhea, constipation, dizziness, abdominal pain, skin rash, fevers, chills, night sweats, weight loss, swollen lymph nodes, body aches, joint swelling, chest pain, shortness of breath, mood changes.  Positive muscle aches.  Objective  Blood pressure (!) 148/98, pulse (!) 55, height 5\' 7"  (1.702 m), weight 218 lb (98.9 kg), SpO2 97 %.    General: [name] apparent distress alert and oriented x3 mood and affect normal, dressed appropriately.  HEENT: Pupils equal, extraocular movements intact  Respiratory: Patient's speak in full sentences and does not appear short of breath  Cardiovascular: [name] lower extremity edema, non tender, [name] erythema  Skin: Warm dry intact with [name] signs of infection or rash on extremities or on  axial skeleton.  Abdomen: Soft nontender  Neuro: Cranial nerves II through XII are intact, neurovascularly intact in all extremities with 2+ DTRs and 2+ pulses.  Lymph: [name] lymphadenopathy of posterior or anterior cervical chain or axillae bilaterally.  Gait normal with good balance and coordination.  MSK:  Non tender with full range of motion and good stability and symmetric strength and tone of shoulders, elbows, wrist, hip, knee and ankles bilaterally.  Back Exam:  Inspection: Loss of lordosis Motion: Flexion 45 deg, Extension 25 deg, Side Bending to 35 deg bilaterally,  Rotation to 45 deg bilaterally  SLR laying: Negative  XSLR laying: Negative  Palpable tenderness: Tender to palpation the paraspinal musculature. FABER: Tightness bilaterally. Sensory change: Gross sensation intact to all lumbar and sacral dermatomes.  Reflexes: 2+ at both patellar tendons, 2+ at achilles tendons, Babinski's downgoing.  Strength at foot  Plantar-flexion: 5/5 Dorsi-flexion: 5/5 Eversion: 5/5 Inversion: 5/5  Leg strength  Quad: 5/5 Hamstring: 5/5 Hip flexor: 5/5 Hip abductors: 4/5   Osteopathic findings  C2 flexed rotated and side bent right C4 flexed rotated and side bent left C6 flexed rotated and side bent left T3 extended rotated and side bent right inhaled third rib T9 extended rotated and side bent left L2 flexed rotated and side bent right Sacrum right on right     Impression and Recommendations:     This case required medical decision making of moderate complexity. The above documentation has been reviewed and is accurate and complete [name], DO       Note: This dictation was prepared with Dragon dictation along with smaller phrase technology. Any transcriptional errors that result from this process are unintentional.

## 2018-08-13 ENCOUNTER — Encounter: Payer: Self-pay | Admitting: Family Medicine

## 2018-08-13 ENCOUNTER — Ambulatory Visit: Payer: BC Managed Care – PPO | Admitting: Family Medicine

## 2018-08-13 VITALS — BP 148/98 | HR 55 | Ht 67.0 in | Wt 218.0 lb

## 2018-08-13 DIAGNOSIS — M545 Low back pain, unspecified: Secondary | ICD-10-CM

## 2018-08-13 DIAGNOSIS — M999 Biomechanical lesion, unspecified: Secondary | ICD-10-CM | POA: Diagnosis not present

## 2018-08-13 NOTE — Patient Instructions (Addendum)
Great to see you  Lucas Young is always your friend Keep up with the core strength and the back exercises.  Posture when you can think about it as well especially with work  Lets try 6-8 weeks

## 2018-08-13 NOTE — Assessment & Plan Note (Signed)
Decision today to treat with OMT was based on Physical Exam  After verbal consent patient was treated with HVLA, ME, FPR techniques in cervical, thoracic, rib lumbar and sacral areas  Patient tolerated the procedure well with improvement in symptoms  Patient given exercises, stretches and lifestyle modifications  See medications in patient instructions if given  Patient will follow up in 4-6 weeks 

## 2018-08-13 NOTE — Assessment & Plan Note (Signed)
Discussed posture and ergonomics.  Discussed which activities to do which wants to avoid.  Discussed topical anti-inflammatories and icing regimen.  Discussed core strengthening exercises.  Follow-up again in 4 to 8 weeks

## 2018-10-01 ENCOUNTER — Ambulatory Visit: Payer: BC Managed Care – PPO | Admitting: Family Medicine

## 2018-10-02 NOTE — Progress Notes (Signed)
[facility] [address] [place], [place] [address] Phone: [phone] Subjective:   [name], am serving as a scribe for Dr. Hulan Saas.    CC: Back pain follow-up  [id number]  Lucas Young is a 62 y.o. male coming in with complaint of back pain follow-up.  Patient does have mild degenerative disc disease.  Discussed with patient about icing regimen and home exercises which she has been doing relatively regularly.  Patient did travel recently and feels like that has caused a little exacerbation.     Past Medical History:  Diagnosis Date  . Allergy    Past Surgical History:  Procedure Laterality Date  . APPENDECTOMY     Social History   Socioeconomic History  . Marital status: Married    Spouse name: Not on file  . Number of children: Not on file  . Years of education: Not on file  . Highest education level: Not on file  Occupational History  . Not on file  Social Needs  . Financial resource strain: Not on file  . Food insecurity:    Worry: Not on file    Inability: Not on file  . Transportation needs:    Medical: Not on file    Non-medical: Not on file  Tobacco Use  . Smoking status: Never Smoker  . Smokeless tobacco: Never Used  Substance and Sexual Activity  . Alcohol use: No    Alcohol/week: 0.0 standard drinks  . Drug use: No  . Sexual activity: Not on file  Lifestyle  . Physical activity:    Days per week: Not on file    Minutes per session: Not on file  . Stress: Not on file  Relationships  . Social connections:    Talks on phone: Not on file    Gets together: Not on file    Attends religious service: Not on file    Active member of club or organization: Not on file    Attends meetings of clubs or organizations: Not on file    Relationship status: Not on file  Other Topics Concern  . Not on file  Social History Narrative  . Not on file   No Known Allergies Family History  Problem  Relation Age of Onset  . Hypertension Mother   . Hypertension Father   . Hyperlipidemia Father   . Hypertension Maternal Grandmother   . Hypertension Maternal Grandfather       Current Outpatient Medications (Respiratory):  .  albuterol (PROVENTIL HFA;VENTOLIN HFA) 108 (90 Base) MCG/ACT inhaler, Inhale 2 puffs into the lungs every 4 (four) hours as needed for wheezing or shortness of breath. .  Azelastine-Fluticasone (DYMISTA NA), Place into the nose. Marland Kitchen  desloratadine (CLARINEX) 5 MG tablet, Take 5 mg by mouth daily. .  montelukast (SINGULAIR) 10 MG tablet, Take 10 mg by mouth at bedtime.    Current Outpatient Medications (Other):  .  hydrOXYzine (ATARAX/VISTARIL) 10 MG tablet, Take 1 tablet (10 mg total) by mouth 3 (three) times daily as needed. .  hydrOXYzine (ATARAX/VISTARIL) 10 MG tablet, Take 1 tablet (10 mg total) by mouth 3 (three) times daily as needed. .  traZODone (DESYREL) 50 MG tablet, Take 1 tablet (50 mg total) by mouth at bedtime as needed for sleep.    Past medical history, social, surgical and family history all reviewed in electronic medical record.  No pertanent information unless stated regarding to the chief complaint.   Review of Systems:  No headache, visual changes, nausea, vomiting, diarrhea, constipation, dizziness, abdominal pain, skin rash, fevers, chills, night sweats, weight loss, swollen lymph nodes, body aches, joint swelling,  chest pain, shortness of breath, mood changes.  Positive muscle aches  Objective  Blood pressure (!) 132/100, pulse 88, height 5\' 7"  (1.702 m), weight 202 lb (91.6 kg), SpO2 94 %.    General: No apparent distress alert and oriented x3 mood and affect normal, dressed appropriately.  HEENT: Pupils equal, extraocular movements intact  Respiratory: Patient's speak in full sentences and does not appear short of breath  Cardiovascular: No lower extremity edema, non tender, no erythema  Skin: Warm dry intact with no signs of  infection or rash on extremities or on axial skeleton.  Abdomen: Soft nontender  Neuro: Cranial nerves II through XII are intact, neurovascularly intact in all extremities with 2+ DTRs and 2+ pulses.  Lymph: No lymphadenopathy of posterior or anterior cervical chain or axillae bilaterally.  Gait normal with good balance and coordination.  MSK:  Non tender with full range of motion and good stability and symmetric strength and tone of shoulders, elbows, wrist, hip, knee and ankles bilaterally.  Back exam does have loss of lordosis.  Patient still has poor core strength.  Patient does have tenderness to palpation of the paraspinal musculature lumbar spine right greater than left.  Mild positive Corky Sox on the right side.  Tightness of the hamstrings bilaterally.  Neurovascular intact distally.  Osteopathic findings C2 flexed rotated and side bent right C4 flexed rotated and side bent left T3 extended rotated and side bent right inhaled third rib T7 extended rotated and side bent left L4 flexed rotated and side bent left  Sacrum right on right    Impression and Recommendations:     This case required medical decision making of moderate complexity. The above documentation has been reviewed and is accurate and complete [name], DO       Note: This dictation was prepared with Dragon dictation along with smaller phrase technology. Any transcriptional errors that result from this process are unintentional.

## 2018-10-04 ENCOUNTER — Ambulatory Visit: Payer: BC Managed Care – PPO | Admitting: Family Medicine

## 2018-10-04 VITALS — BP 132/100 | HR 88 | Ht 67.0 in | Wt 202.0 lb

## 2018-10-04 DIAGNOSIS — M533 Sacrococcygeal disorders, not elsewhere classified: Secondary | ICD-10-CM

## 2018-10-04 DIAGNOSIS — M999 Biomechanical lesion, unspecified: Secondary | ICD-10-CM | POA: Diagnosis not present

## 2018-10-04 NOTE — Assessment & Plan Note (Signed)
Decision today to treat with OMT was based on Physical Exam  After verbal consent patient was treated with HVLA, ME, FPR techniques in cervical, thoracic, rib,  lumbar and sacral areas  Patient tolerated the procedure well with improvement in symptoms  Patient given exercises, stretches and lifestyle modifications  See medications in patient instructions if given  Patient will follow up in 4-8 weeks 

## 2018-10-04 NOTE — Patient Instructions (Signed)
Good to see you  Ice is your friend Keep up the weight loss  See me again in 6-8 weeks

## 2018-10-04 NOTE — Assessment & Plan Note (Signed)
Stable overall.  Discussed posture and ergonomics.  Encouraged still the weight loss.  Patient has lost 12 pounds since he has started dieting and encouraged him to continue to do so.  We discussed icing regimen and home exercises.  Follow-up with me again in 4 to 8 weeks

## 2018-11-29 NOTE — Progress Notes (Signed)
[name] [place], [place] [address] Phone: [phone] Subjective:   Lucas Young, am serving as a scribe for Dr. Hulan Saas.   CC: Neck and lower back pain  [id number]  Lucas Young is a 63 y.o. male coming in with complaint of neck and lower back pain. Is having is right sided lower back pain and SI joint pain. Noticed an increase in pain after walking on the beach.  Patient states it seems to be very localized.  Young radiation down the legs, Young numbness or tingling.  Rates the severity of pain is 5 out of 10.  Just does not seem to go away.  Did not wake him up at night though.     Past Medical History:  Diagnosis Date  . Allergy    Past Surgical History:  Procedure Laterality Date  . APPENDECTOMY     Social History   Socioeconomic History  . Marital status: Married    Spouse name: Not on file  . Number of children: Not on file  . Years of education: Not on file  . Highest education level: Not on file  Occupational History  . Not on file  Social Needs  . Financial resource strain: Not on file  . Food insecurity:    Worry: Not on file    Inability: Not on file  . Transportation needs:    Medical: Not on file    Non-medical: Not on file  Tobacco Use  . Smoking status: Never Smoker  . Smokeless tobacco: Never Used  Substance and Sexual Activity  . Alcohol use: Young    Alcohol/week: 0.0 standard drinks  . Drug use: Young  . Sexual activity: Not on file  Lifestyle  . Physical activity:    Days per week: Not on file    Minutes per session: Not on file  . Stress: Not on file  Relationships  . Social connections:    Talks on phone: Not on file    Gets together: Not on file    Attends religious service: Not on file    Active member of club or organization: Not on file    Attends meetings of clubs or organizations: Not on file    Relationship status: Not on file  Other Topics Concern  . Not on file    Social History Narrative  . Not on file   Young Known Allergies Family History  Problem Relation Age of Onset  . Hypertension Mother   . Hypertension Father   . Hyperlipidemia Father   . Hypertension Maternal Grandmother   . Hypertension Maternal Grandfather       Current Outpatient Medications (Respiratory):  .  albuterol (PROVENTIL HFA;VENTOLIN HFA) 108 (90 Base) MCG/ACT inhaler, Inhale 2 puffs into the lungs every 4 (four) hours as needed for wheezing or shortness of breath. .  Azelastine-Fluticasone (DYMISTA NA), Place into the nose. Marland Kitchen  desloratadine (CLARINEX) 5 MG tablet, Take 5 mg by mouth daily. .  montelukast (SINGULAIR) 10 MG tablet, Take 10 mg by mouth at bedtime.    Current Outpatient Medications (Other):  .  hydrOXYzine (ATARAX/VISTARIL) 10 MG tablet, Take 1 tablet (10 mg total) by mouth 3 (three) times daily as needed. .  hydrOXYzine (ATARAX/VISTARIL) 10 MG tablet, Take 1 tablet (10 mg total) by mouth 3 (three) times daily as needed. .  traZODone (DESYREL) 50 MG tablet, Take 1 tablet (50 mg total) by mouth at bedtime as needed for  sleep.    Past medical history, social, surgical and family history all reviewed in electronic medical record.  Young pertanent information unless stated regarding to the chief complaint.   Review of Systems:  Young headache, visual changes, nausea, vomiting, diarrhea, constipation, dizziness, abdominal pain, skin rash, fevers, chills, night sweats, weight loss, swollen lymph nodes, body aches, joint swelling, chest pain, shortness of breath, mood changes.  Positive muscle aches  Objective  Pulse 65, height 5\' 7"  (1.702 m), weight 208 lb (94.3 kg), SpO2 98 %.   General: Young apparent distress alert and oriented x3 mood and affect normal, dressed appropriately.  HEENT: Pupils equal, extraocular movements intact  Respiratory: Patient's speak in full sentences and does not appear short of breath  Cardiovascular: Young lower extremity edema, non  tender, Young erythema  Skin: Warm dry intact with Young signs of infection or rash on extremities or on axial skeleton.  Abdomen: Soft nontender  Neuro: Cranial nerves II through XII are intact, neurovascularly intact in all extremities with 2+ DTRs and 2+ pulses.  Lymph: Young lymphadenopathy of posterior or anterior cervical chain or axillae bilaterally.  Gait normal with good balance and coordination.  MSK:  Non tender with full range of motion and good stability and symmetric strength and tone of shoulders, elbows, wrist, hip, knee and ankles bilaterally.  Back Exam:  Inspection: Mild increase in lordosis which is different than patient's baseline Motion: Flexion 45 deg, Extension 25 deg, Side Bending to 35 deg bilaterally,  Rotation to 35 deg bilaterally  SLR laying: Negative  XSLR laying: Negative  Palpable tenderness: Tender to palpation of paraspinal musculature lumbar spine right greater than left. FABER: Mild tightness bilaterally. Sensory change: Gross sensation intact to all lumbar and sacral dermatomes.  Reflexes: 2+ at both patellar tendons, 2+ at achilles tendons, Babinski's downgoing.  Strength at foot  Plantar-flexion: 5/5 Dorsi-flexion: 5/5 Eversion: 5/5 Inversion: 5/5  Leg strength  Quad: 5/5 Hamstring: 5/5 Hip flexor: 5/5 Hip abductors: 5/5  Gait unremarkable.  Osteopathic findings C2 flexed rotated and side bent right T3 extended rotated and side bent right inhaled third rib T9 extended rotated and side bent left L3 flexed rotated and side bent left Sacrum right on right    Impression and Recommendations:     This case required medical decision making of moderate complexity. The above documentation has been reviewed and is accurate and complete Lucas Pulley, DO       Note: This dictation was prepared with Dragon dictation along with smaller phrase technology. Any transcriptional errors that result from this process are unintentional.

## 2018-11-30 ENCOUNTER — Ambulatory Visit: Payer: BC Managed Care – PPO | Admitting: Family Medicine

## 2018-11-30 ENCOUNTER — Encounter: Payer: Self-pay | Admitting: Family Medicine

## 2018-11-30 VITALS — HR 65 | Ht 67.0 in | Wt 208.0 lb

## 2018-11-30 DIAGNOSIS — M545 Low back pain, unspecified: Secondary | ICD-10-CM

## 2018-11-30 DIAGNOSIS — M999 Biomechanical lesion, unspecified: Secondary | ICD-10-CM

## 2018-11-30 NOTE — Patient Instructions (Signed)
Good to see you  You should do great overall  Ice is your friend Stay active See me again in 1-2 months

## 2018-11-30 NOTE — Assessment & Plan Note (Signed)
Decision today to treat with OMT was based on Physical Exam  After verbal consent patient was treated with HVLA, ME, FPR techniques in cervical, thoracic, rib,  lumbar and sacral areas  Patient tolerated the procedure well with improvement in symptoms  Patient given exercises, stretches and lifestyle modifications  See medications in patient instructions if given  Patient will follow up in 4-8 weeks 

## 2018-11-30 NOTE — Assessment & Plan Note (Signed)
Stable overall.  Still needs to work on core strength.  Discussed the posture and ergonomics and the importance of the hip abductor strengthening.  Discussed icing regimen.  Follow-up with me again in 4 to 8 weeks.

## 2018-12-31 NOTE — Progress Notes (Signed)
[name] [place],  [address] Phone: [phone] Subjective:    I Kandace Blitz am serving as a Education administrator for Dr. Hulan Saas.   CC: Back pain follow-up  [id number]     Updated 01/01/2019  Chue Berkovich is a 63 y.o. male coming in with complaint of back pain. States that the lower back is giving him some pain. Right hip. Neck is stiff. Patient is started working again.  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  Now some increasing stress that is likely contributing to some of the discomfort and pain again.  Discussed posture and ergonomics which he is only been doing intermittently.  Not doing very well overall with conservative therapy     Past Medical History:  Diagnosis Date  . Allergy    Past Surgical History:  Procedure Laterality Date  . APPENDECTOMY     Social History   Socioeconomic History  . Marital status: Married    Spouse name: Not on file  . Number of children: Not on file  . Years of education: Not on file  . Highest education level: Not on file  Occupational History  . Not on file  Social Needs  . Financial resource strain: Not on file  . Food insecurity:    Worry: Not on file    Inability: Not on file  . Transportation needs:    Medical: Not on file    Non-medical: Not on file  Tobacco Use  . Smoking status: Never Smoker  . Smokeless tobacco: Never Used  Substance and Sexual Activity  . Alcohol use: No    Alcohol/week: 0.0 standard drinks  . Drug use: No  . Sexual activity: Not on file  Lifestyle  . Physical activity:    Days per week: Not on file    Minutes per session: Not on file  . Stress: Not on file  Relationships  . Social connections:    Talks on phone: Not on file    Gets together: Not on file    Attends religious service: Not on file    Active member of club or organization: Not on file    Attends meetings of  clubs or organizations: Not on file    Relationship status: Not on file  Other Topics Concern  . Not on file  Social History Narrative  . Not on file   No Known Allergies Family History  Problem Relation Age of Onset  . Hypertension Mother   . Hypertension Father   . Hyperlipidemia Father   . Hypertension Maternal Grandmother   . Hypertension Maternal Grandfather       Current Outpatient Medications (Respiratory):  .  albuterol (PROVENTIL HFA;VENTOLIN HFA) 108 (90 Base) MCG/ACT inhaler, Inhale 2 puffs into the lungs every 4 (four) hours as needed for wheezing or shortness of breath. .  Azelastine-Fluticasone (DYMISTA NA), Place into the nose. Marland Kitchen  desloratadine (CLARINEX) 5 MG tablet, Take 5 mg by mouth daily. .  montelukast (SINGULAIR) 10 MG tablet, Take 10 mg by mouth at bedtime.    Current Outpatient Medications (Other):  .  hydrOXYzine (ATARAX/VISTARIL) 10 MG tablet, Take 1 tablet (10 mg total) by mouth 3 (three) times daily as needed. .  traZODone (DESYREL) 50 MG tablet, Take 1 tablet (50 mg total) by mouth at bedtime as needed for sleep.    Past medical history, social, surgical and family history all reviewed in  electronic medical record.  No pertanent information unless stated regarding to the chief complaint.   Review of Systems:  No headache, visual changes, nausea, vomiting, diarrhea, constipation, dizziness, abdominal pain, skin rash, fevers, chills, night sweats, weight loss, swollen lymph nodes, body aches, joint swelling, muscle aches, chest pain, shortness of breath, mood changes.   Objective  Blood pressure 130/88, pulse (!) 46, height 5\' 7"  (1.702 m), weight 207 lb (93.9 kg), SpO2 96 %.   General: No apparent distress alert and oriented x3 mood and affect normal, dressed appropriately.  HEENT: Pupils equal, extraocular movements intact  Respiratory: Patient's speak in full sentences and does not appear short of breath  Cardiovascular: No lower extremity  edema, non tender, no erythema  Skin: Warm dry intact with no signs of infection or rash on extremities or on axial skeleton.  Abdomen: Soft nontender  Neuro: Cranial nerves II through XII are intact, neurovascularly intact in all extremities with 2+ DTRs and 2+ pulses.  Lymph: No lymphadenopathy of posterior or anterior cervical chain or axillae bilaterally.  Gait normal with good balance and coordination.  MSK:  Non tender with full range of motion and good stability and symmetric strength and tone of shoulders, elbows, wrist, hip, knee and ankles bilaterally.  Back exam shows the patient does have some mild loss of lordosis.  Patient does still have poor core strength.  Patient does have positive Corky Sox on the right side.  Tenderness to palpation of paraspinal musculature of the lumbar spine as well.  No spinous process tenderness.  Full strength of the lower extremities neurovascular intact with 5 out of 5 strength and good deep tendon reflexes  Osteopathic findings C2 flexed rotated and side bent right C6 flexed rotated and side bent left T3 extended rotated and side bent right inhaled third rib T7 extended rotated and side bent left L4 flexed rotated and side bent left  Sacrum right on right    Impression and Recommendations:     This case required medical decision making of moderate complexity. The above documentation has been reviewed and is accurate and complete [name], DO       Note: This dictation was prepared with Dragon dictation along with smaller phrase technology. Any transcriptional errors that result from this process are unintentional.

## 2019-01-01 ENCOUNTER — Encounter: Payer: Self-pay | Admitting: Family Medicine

## 2019-01-01 ENCOUNTER — Ambulatory Visit: Payer: BC Managed Care – PPO | Admitting: Family Medicine

## 2019-01-01 VITALS — BP 130/88 | HR 46 | Ht 67.0 in | Wt 207.0 lb

## 2019-01-01 DIAGNOSIS — M999 Biomechanical lesion, unspecified: Secondary | ICD-10-CM | POA: Diagnosis not present

## 2019-01-01 DIAGNOSIS — M533 Sacrococcygeal disorders, not elsewhere classified: Secondary | ICD-10-CM | POA: Diagnosis not present

## 2019-01-01 NOTE — Patient Instructions (Signed)
Good to see you  Ice is your friend Glute muscle exercises  See me again in 4 weeks

## 2019-01-01 NOTE — Assessment & Plan Note (Signed)
Decision today to treat with OMT was based on Physical Exam  After verbal consent patient was treated with HVLA, ME, FPR techniques in cervical, thoracic, rib lumbar and sacral areas  Patient tolerated the procedure well with improvement in symptoms  Patient given exercises, stretches and lifestyle modifications  See medications in patient instructions if given  Patient will follow up in 6 weeks 

## 2019-01-01 NOTE — Assessment & Plan Note (Signed)
Stable.  I do believe that some of it is secondary to more stress.  Not also doing the exercises as frequently.  Encouraged weight loss, discussed core strengthening.  Responding well to manipulation.  Follow-up in 4 weeks instead of 6 weeks secondary to the mild increased pain

## 2019-01-26 NOTE — Progress Notes (Signed)
[name] [address] [place], [place] [address] Phone: [phone] Subjective:    Lucas [name], am serving as a scribe for Dr. Hulan Saas.   CC: Back pain follow-up  JAS:[id number]  Lucas [name] is a 63 y.o. male coming in with complaint of back pain. Patient states that he has been having left sided tightness. Patient has been working in his garden.       Past Medical History:  Diagnosis Date  . Allergy    Past Surgical History:  Procedure Laterality Date  . APPENDECTOMY     Social History   Socioeconomic History  . Marital status: Married    Spouse name: Not on file  . Number of children: Not on file  . Years of education: Not on file  . Highest education level: Not on file  Occupational History  . Not on file  Social Needs  . Financial resource strain: Not on file  . Food insecurity:    Worry: Not on file    Inability: Not on file  . Transportation needs:    Medical: Not on file    Non-medical: Not on file  Tobacco Use  . Smoking status: Never Smoker  . Smokeless tobacco: Never Used  Substance and Sexual Activity  . Alcohol use: [name]    Alcohol/week: 0.0 standard drinks  . Drug use: [name]  . Sexual activity: Not on file  Lifestyle  . Physical activity:    Days per week: Not on file    Minutes per session: Not on file  . Stress: Not on file  Relationships  . Social connections:    Talks on phone: Not on file    Gets together: Not on file    Attends religious service: Not on file    Active member of club or organization: Not on file    Attends meetings of clubs or organizations: Not on file    Relationship status: Not on file  Other Topics Concern  . Not on file  Social History Narrative  . Not on file   [name] Known Allergies Family History  Problem Relation Age of Onset  . Hypertension Mother   . Hypertension Father   . Hyperlipidemia Father   . Hypertension Maternal Grandmother   . Hypertension  Maternal Grandfather       Current Outpatient Medications (Respiratory):  .  albuterol (PROVENTIL HFA;VENTOLIN HFA) 108 (90 Base) MCG/ACT inhaler, Inhale 2 puffs into the lungs every 4 (four) hours as needed for wheezing or shortness of breath. .  Azelastine-Fluticasone (DYMISTA NA), Place into the nose. Marland Kitchen  desloratadine (CLARINEX) 5 MG tablet, Take 5 mg by mouth daily. .  montelukast (SINGULAIR) 10 MG tablet, Take 10 mg by mouth at bedtime.    Current Outpatient Medications (Other):  .  hydrOXYzine (ATARAX/VISTARIL) 10 MG tablet, Take 1 tablet (10 mg total) by mouth 3 (three) times daily as needed. .  traZODone (DESYREL) 50 MG tablet, Take 1 tablet (50 mg total) by mouth at bedtime as needed for sleep.    Past medical history, social, surgical and family history all reviewed in electronic medical record.  [name] pertanent information unless stated regarding to the chief complaint.   Review of Systems:  [name] headache, visual changes, nausea, vomiting, diarrhea, constipation, dizziness, abdominal pain, skin rash, fevers, chills, night sweats, weight loss, swollen lymph nodes, body aches, joint swelling, muscle aches, chest pain, shortness of breath, mood changes.   Objective  There were  [name] vitals taken for this visit. Systems examined below as of    General: [name] apparent distress alert and oriented x3 mood and affect normal, dressed appropriately.  HEENT: Pupils equal, extraocular movements intact  Respiratory: Patient's speak in full sentences and does not appear short of breath  Cardiovascular: [name] lower extremity edema, non tender, [name] erythema  Skin: Warm dry intact with [name] signs of infection or rash on extremities or on axial skeleton.  Abdomen: Soft nontender  Neuro: Cranial nerves II through XII are intact, neurovascularly intact in all extremities with 2+ DTRs and 2+ pulses.  Lymph: [name] lymphadenopathy of posterior or anterior cervical chain or axillae bilaterally.  Gait normal with  good balance and coordination.  MSK:  Non tender with full range of motion and good stability and symmetric strength and tone of shoulders, elbows, wrist, hip, knee and ankles bilaterally.  Back Exam:  Inspection: Loss of lordosis Motion: Flexion 45 deg, Extension 25 deg, Side Bending to 35 deg bilaterally,  Rotation to 35 deg bilaterally  SLR laying: Negative  XSLR laying: Negative  Palpable tenderness: Tender to palpation the paraspinal musculature lumbar spine right greater than left. FABER: Tightness on the right. Sensory change: Gross sensation intact to all lumbar and sacral dermatomes.  Reflexes: 2+ at both patellar tendons, 2+ at achilles tendons, Babinski's downgoing.  Strength at foot  Plantar-flexion: 5/5 Dorsi-flexion: 5/5 Eversion: 5/5 Inversion: 5/5  Leg strength  Quad: 5/5 Hamstring: 5/5 Hip flexor: 5/5 Hip abductors: 5/5  Gait unremarkable.  Osteopathic findings  C2 flexed rotated and side bent right C6 flexed rotated and side bent left T3 extended rotated and side bent right inhaled third rib T9 extended rotated and side bent left L2 flexed rotated and side bent right Sacrum right on right    Impression and Recommendations:     This case required medical decision making of moderate complexity. The above documentation has been reviewed and is accurate and complete Lucas Pulley, DO       Note: This dictation was prepared with Dragon dictation along with smaller phrase technology. Any transcriptional errors that result from this process are unintentional.

## 2019-01-28 ENCOUNTER — Ambulatory Visit: Payer: BC Managed Care – PPO | Admitting: Family Medicine

## 2019-01-28 ENCOUNTER — Encounter: Payer: Self-pay | Admitting: Family Medicine

## 2019-01-28 VITALS — BP 140/98 | HR 65 | Ht 67.0 in | Wt 207.0 lb

## 2019-01-28 DIAGNOSIS — M545 Low back pain, unspecified: Secondary | ICD-10-CM

## 2019-01-28 DIAGNOSIS — M999 Biomechanical lesion, unspecified: Secondary | ICD-10-CM | POA: Diagnosis not present

## 2019-01-28 NOTE — Assessment & Plan Note (Signed)
tightness in the right side.  Discussed core strengthening.  Patient is attempting to lose weight.  We discussed over-the-counter medications.  Discussed continuing to be active.  Follow-up again in 4 to 8 weeks.

## 2019-01-28 NOTE — Patient Instructions (Addendum)
Good to see you  Keep trucking along.  Ice is your friend  Stay active Continue to work on the core strength  DHEA 50 mg daily  See me again in 4 weeks

## 2019-01-28 NOTE — Assessment & Plan Note (Signed)
Decision today to treat with OMT was based on Physical Exam  After verbal consent patient was treated with HVLA, ME, FPR techniques in cervical, thoracic, rib lumbar and sacral areas  Patient tolerated the procedure well with improvement in symptoms  Patient given exercises, stretches and lifestyle modifications  See medications in patient instructions if given  Patient will follow up in 4-8 weeks 

## 2019-02-25 ENCOUNTER — Ambulatory Visit: Payer: BC Managed Care – PPO | Admitting: Family Medicine

## 2019-03-20 ENCOUNTER — Ambulatory Visit: Payer: BC Managed Care – PPO | Admitting: Family Medicine

## 2019-04-10 NOTE — Progress Notes (Signed)
[name] [address] [place], [place] [address] Phone: [phone] Subjective:    [name], am serving as a scribe for Dr. Hulan Saas.   CC: back and neck pain   FTD:[id number]  Lucas Young is a 63 y.o. male coming in with complaint of back pain. Patient is having thoracic spine pain on the left side. Also having some cervical spine pain due to sitting too much.  Patient states it is more of a tightness.  No radicular symptoms.  Has not been taking any of his medications on a regular basis.  Still takes vitamins fairly regularly though.      Past Medical History:  Diagnosis Date  . Allergy    Past Surgical History:  Procedure Laterality Date  . APPENDECTOMY     Social History   Socioeconomic History  . Marital status: Married    Spouse name: Not on file  . Number of children: Not on file  . Years of education: Not on file  . Highest education level: Not on file  Occupational History  . Not on file  Social Needs  . Financial resource strain: Not on file  . Food insecurity:    Worry: Not on file    Inability: Not on file  . Transportation needs:    Medical: Not on file    Non-medical: Not on file  Tobacco Use  . Smoking status: Never Smoker  . Smokeless tobacco: Never Used  Substance and Sexual Activity  . Alcohol use: No    Alcohol/week: 0.0 standard drinks  . Drug use: No  . Sexual activity: Not on file  Lifestyle  . Physical activity:    Days per week: Not on file    Minutes per session: Not on file  . Stress: Not on file  Relationships  . Social connections:    Talks on phone: Not on file    Gets together: Not on file    Attends religious service: Not on file    Active member of club or organization: Not on file    Attends meetings of clubs or organizations: Not on file    Relationship status: Not on file  Other Topics Concern  . Not on file  Social History Narrative  . Not on file   No  Known Allergies Family History  Problem Relation Age of Onset  . Hypertension Mother   . Hypertension Father   . Hyperlipidemia Father   . Hypertension Maternal Grandmother   . Hypertension Maternal Grandfather       Current Outpatient Medications (Respiratory):  .  albuterol (PROVENTIL HFA;VENTOLIN HFA) 108 (90 Base) MCG/ACT inhaler, Inhale 2 puffs into the lungs every 4 (four) hours as needed for wheezing or shortness of breath. .  Azelastine-Fluticasone (DYMISTA NA), Place into the nose. Marland Kitchen  desloratadine (CLARINEX) 5 MG tablet, Take 5 mg by mouth daily. .  montelukast (SINGULAIR) 10 MG tablet, Take 10 mg by mouth at bedtime.    Current Outpatient Medications (Other):  .  hydrOXYzine (ATARAX/VISTARIL) 10 MG tablet, Take 1 tablet (10 mg total) by mouth 3 (three) times daily as needed. .  traZODone (DESYREL) 50 MG tablet, Take 1 tablet (50 mg total) by mouth at bedtime as needed for sleep.    Past medical history, social, surgical and family history all reviewed in electronic medical record.  No pertanent information unless stated regarding to the chief complaint.   Review of Systems:  No headache, visual  changes, nausea, vomiting, diarrhea, constipation, dizziness, abdominal pain, skin rash, fevers, chills, night sweats, weight loss, swollen lymph nodes, body aches, joint swelling, muscle aches, chest pain, shortness of breath, mood changes.   Objective  Blood pressure 120/84, pulse (!) 54, height 5\' 7"  (1.702 m), weight 213 lb (96.6 kg), SpO2 96 %.    General: No apparent distress alert and oriented x3 mood and affect normal, dressed appropriately.  HEENT: Pupils equal, extraocular movements intact  Respiratory: Patient's speak in full sentences and does not appear short of breath  Cardiovascular: No lower extremity edema, non tender, no erythema  Skin: Warm dry intact with no signs of infection or rash on extremities or on axial skeleton.  Abdomen: Soft nontender  Neuro:  Cranial nerves II through XII are intact, neurovascularly intact in all extremities with 2+ DTRs and 2+ pulses.  Lymph: No lymphadenopathy of posterior or anterior cervical chain or axillae bilaterally.  Gait normal with good balance and coordination.  MSK:  Non tender with full range of motion and good stability and symmetric strength and tone of shoulders, elbows, wrist, hip, knee and ankles bilaterally.  Neck: Inspection loss of lordosis. No palpable stepoffs. Negative Spurling's maneuver. Full neck range of motion Grip strength and sensation normal in bilateral hands Strength good C4 to T1 distribution No sensory change to C4 to T1 Negative Hoffman sign bilaterally Reflexes normal  Back Exam:  Inspection: Loss of lordosis and poor core strength Motion: Flexion 45 deg, Extension 25 deg, Side Bending to 35 deg bilaterally,  Rotation to 45 deg bilaterally  SLR laying: Negative  XSLR laying: Negative  Palpable tenderness: Tender to palpation of paraspinal musculature lumbar spine right greater than left. FABER: Tightness on the right. Sensory change: Gross sensation intact to all lumbar and sacral dermatomes.  Reflexes: 2+ at both patellar tendons, 2+ at achilles tendons, Babinski's downgoing.  Strength at foot  Plantar-flexion: 5/5 Dorsi-flexion: 5/5 Eversion: 5/5 Inversion: 5/5  Leg strength  Quad: 5/5 Hamstring: 5/5 Hip flexor: 5/5 Hip abductors: 5/5  Gait unremarkable.  Osteopathic findings  C2 flexed rotated and side bent right C6 flexed rotated and side bent left T3 extended rotated and side bent right inhaled third rib T7 extended rotated and side bent left L2 flexed rotated and side bent right Sacrum right on right    Impression and Recommendations:     This case required medical decision making of moderate complexity. The above documentation has been reviewed and is accurate and complete [name], DO       Note: This dictation was prepared with Dragon  dictation along with smaller phrase technology. Any transcriptional errors that result from this process are unintentional.

## 2019-04-11 ENCOUNTER — Ambulatory Visit: Payer: BC Managed Care – PPO | Admitting: Family Medicine

## 2019-04-11 ENCOUNTER — Encounter: Payer: Self-pay | Admitting: Family Medicine

## 2019-04-11 ENCOUNTER — Other Ambulatory Visit: Payer: Self-pay

## 2019-04-11 VITALS — BP 120/84 | HR 54 | Ht 67.0 in | Wt 213.0 lb

## 2019-04-11 DIAGNOSIS — M999 Biomechanical lesion, unspecified: Secondary | ICD-10-CM | POA: Diagnosis not present

## 2019-04-11 DIAGNOSIS — M533 Sacrococcygeal disorders, not elsewhere classified: Secondary | ICD-10-CM

## 2019-04-11 NOTE — Patient Instructions (Signed)
Great to see you  Stay safe  Try to change position when working or dance See you in 4-6 weeks

## 2019-04-11 NOTE — Assessment & Plan Note (Signed)
Decision today to treat with OMT was based on Physical Exam  After verbal consent patient was treated with HVLA, ME, FPR techniques in cervical, thoracic, rib lumbar and sacral areas  Patient tolerated the procedure well with improvement in symptoms  Patient given exercises, stretches and lifestyle modifications  See medications in patient instructions if given  Patient will follow up in 4-8 weeks 

## 2019-04-11 NOTE — Assessment & Plan Note (Signed)
Stable.  He continues to have some discomfort.  Discussed ergonomic changes patient remains at home. Discussed the importance of weight loss and core stability again.  Patient will follow-up with me again in 4 to 8 weeks

## 2019-04-24 ENCOUNTER — Other Ambulatory Visit: Payer: Self-pay | Admitting: Family Medicine

## 2019-04-25 NOTE — Progress Notes (Signed)
[name] [place],  [address] Phone: [phone] Subjective:     CC: left sided back pain   DTO:[id number]  Lucas Young is a 63 y.o. male coming in with complaint of left sided thoracic spine pain. Has been waking up at night. Had to use trazadone for pain. Has been having pain since Monday.  Patient does not remember any true injury.  Does not know exactly what seemed to exacerbate it.  Patient does when going through review of systems has had some mild increase in abdominal pain.  Past medical history is significant for diverticulosis.    Past Medical History:  Diagnosis Date  . Allergy    Past Surgical History:  Procedure Laterality Date  . APPENDECTOMY     Social History   Socioeconomic History  . Marital status: Married    Spouse name: Not on file  . Number of children: Not on file  . Years of education: Not on file  . Highest education level: Not on file  Occupational History  . Not on file  Social Needs  . Financial resource strain: Not on file  . Food insecurity:    Worry: Not on file    Inability: Not on file  . Transportation needs:    Medical: Not on file    Non-medical: Not on file  Tobacco Use  . Smoking status: Never Smoker  . Smokeless tobacco: Never Used  Substance and Sexual Activity  . Alcohol use: No    Alcohol/week: 0.0 standard drinks  . Drug use: No  . Sexual activity: Not on file  Lifestyle  . Physical activity:    Days per week: Not on file    Minutes per session: Not on file  . Stress: Not on file  Relationships  . Social connections:    Talks on phone: Not on file    Gets together: Not on file    Attends religious service: Not on file    Active member of club or organization: Not on file    Attends meetings of clubs or organizations: Not on file    Relationship status: Not on file  Other Topics Concern  . Not on file  Social History Narrative  . Not on file   No  Known Allergies Family History  Problem Relation Age of Onset  . Hypertension Mother   . Hypertension Father   . Hyperlipidemia Father   . Hypertension Maternal Grandmother   . Hypertension Maternal Grandfather       Current Outpatient Medications (Respiratory):  .  albuterol (PROVENTIL HFA;VENTOLIN HFA) 108 (90 Base) MCG/ACT inhaler, Inhale 2 puffs into the lungs every 4 (four) hours as needed for wheezing or shortness of breath. .  Azelastine-Fluticasone (DYMISTA NA), Place into the nose. Marland Kitchen  desloratadine (CLARINEX) 5 MG tablet, Take 5 mg by mouth daily. .  montelukast (SINGULAIR) 10 MG tablet, Take 10 mg by mouth at bedtime.    Current Outpatient Medications (Other):  .  hydrOXYzine (ATARAX/VISTARIL) 10 MG tablet, Take 1 tablet (10 mg total) by mouth 3 (three) times daily as needed. .  traZODone (DESYREL) 50 MG tablet, TAKE 1 TABLET BY MOUTH AT BEDTIME IF NEEDED FOR SLEEP. Marland Kitchen  doxycycline (VIBRA-TABS) 100 MG tablet, Take 1 tablet (100 mg total) by mouth 2 (two) times daily for 7 days. .  metroNIDAZOLE (FLAGYL) 500 MG tablet, Take 1 tablet (500 mg total) by mouth 2 (two) times daily for 7 days.  Past medical history, social, surgical and family history all reviewed in electronic medical record.  No pertanent information unless stated regarding to the chief complaint.   Review of Systems:  No headache, visual changes, nausea, vomiting, diarrhea, constipation, dizziness, skin rash, fevers, chills, night sweats, weight loss, swollen lymph nodes, body aches, joint swelling,  chest pain, shortness of breath, mood changes.  Positive muscle aches, abdominal pain  Objective  Blood pressure (!) 138/98, pulse 76, height 5\' 7"  (1.702 m), weight 213 lb (96.6 kg), SpO2 96 %.    General: No apparent distress alert and oriented x3 mood and affect normal, dressed appropriately.  HEENT: Pupils equal, extraocular movements intact  Respiratory: Patient's speak in full sentences and does not  appear short of breath  Cardiovascular: No lower extremity edema, non tender, no erythema  Skin: Warm dry intact with no signs of infection or rash on extremities or on axial skeleton.  Abdomen: Soft moderately distended.  Patient is tender to palpation in the left upper quadrant.  No masses appreciated. Neuro: Cranial nerves II through XII are intact, neurovascularly intact in all extremities with 2+ DTRs and 2+ pulses.  Lymph: No lymphadenopathy of posterior or anterior cervical chain or axillae bilaterally.  Gait normal with good balance and coordination.  MSK:  Non tender with full range of motion and good stability and symmetric strength and tone of shoulders, elbows, wrist, hip, knee and ankles bilaterally.  Pain in the thoracolumbar juncture on the left side.  Moderate to severe.  Mild decrease in range of motion likely secondary to more of the pain.     Impression and Recommendations:     This case required medical decision making of moderate complexity. The above documentation has been reviewed and is accurate and complete [name], DO       Note: This dictation was prepared with Dragon dictation along with smaller phrase technology. Any transcriptional errors that result from this process are unintentional.

## 2019-04-26 ENCOUNTER — Encounter: Payer: Self-pay | Admitting: Family Medicine

## 2019-04-26 ENCOUNTER — Other Ambulatory Visit: Payer: Self-pay

## 2019-04-26 ENCOUNTER — Ambulatory Visit (INDEPENDENT_AMBULATORY_CARE_PROVIDER_SITE_OTHER): Payer: BC Managed Care – PPO | Admitting: Family Medicine

## 2019-04-26 VITALS — BP 138/98 | HR 76 | Ht 67.0 in | Wt 213.0 lb

## 2019-04-26 DIAGNOSIS — M545 Low back pain, unspecified: Secondary | ICD-10-CM

## 2019-04-26 DIAGNOSIS — M999 Biomechanical lesion, unspecified: Secondary | ICD-10-CM

## 2019-04-26 MED ORDER — METHYLPREDNISOLONE ACETATE 80 MG/ML IJ SUSP
80.0000 mg | Freq: Once | INTRAMUSCULAR | Status: AC
  Administered 2019-04-26: 80 mg via INTRAMUSCULAR

## 2019-04-26 MED ORDER — DOXYCYCLINE HYCLATE 100 MG PO TABS: 100.0000 mg | ORAL_TABLET | Freq: Two times a day (BID) | ORAL | 0 refills | Status: AC

## 2019-04-26 MED ORDER — METRONIDAZOLE 500 MG PO TABS: 500.0000 mg | ORAL_TABLET | Freq: Two times a day (BID) | ORAL | 0 refills | Status: AC

## 2019-04-26 MED ORDER — KETOROLAC TROMETHAMINE 60 MG/2ML IM SOLN
60.0000 mg | Freq: Once | INTRAMUSCULAR | Status: AC
Start: 2019-04-26 — End: 2019-04-26
  Administered 2019-04-26: 11:00:00 60 mg via INTRAMUSCULAR

## 2019-04-26 NOTE — Patient Instructions (Signed)
Good to see you  2 injections in your back side today  Doxycycline and flagyl 2 times a day for 1 week Duexis 3 times a day for 3 days starting tomorrow.  Send [place] a message on Monday if not better

## 2019-04-26 NOTE — Assessment & Plan Note (Signed)
Thoracolumbar juncture.  Discussed with patient in great length about home exercises, ice reflex.  Patient should be fine but if any fevers or chills patient knows to seek medical attention immediately.  Started on doxycycline as well as Flagyl.  Patient given injection for Toradol and Depo-Medrol.  Trial of Duexis given as well.

## 2019-04-29 ENCOUNTER — Encounter: Payer: Self-pay | Admitting: Family Medicine

## 2019-05-23 ENCOUNTER — Ambulatory Visit: Payer: BC Managed Care – PPO | Admitting: Family Medicine

## 2019-05-28 ENCOUNTER — Ambulatory Visit: Payer: BC Managed Care – PPO | Admitting: Family Medicine

## 2019-05-28 ENCOUNTER — Other Ambulatory Visit: Payer: Self-pay

## 2019-05-28 ENCOUNTER — Encounter: Payer: Self-pay | Admitting: Family Medicine

## 2019-05-28 VITALS — BP 148/118 | HR 75 | Ht 67.0 in | Wt 218.0 lb

## 2019-05-28 DIAGNOSIS — M999 Biomechanical lesion, unspecified: Secondary | ICD-10-CM

## 2019-05-28 DIAGNOSIS — M533 Sacrococcygeal disorders, not elsewhere classified: Secondary | ICD-10-CM | POA: Diagnosis not present

## 2019-05-28 MED ORDER — VENLAFAXINE HCL ER 37.5 MG PO CP24: 37.5000 mg | ORAL_CAPSULE | Freq: Every day | ORAL | 0 refills | Status: DC

## 2019-05-28 NOTE — Assessment & Plan Note (Signed)
Decision today to treat with OMT was based on Physical Exam  After verbal consent patient was treated with HVLA, ME, FPR techniques in cervical, thoracic, rib lumbar and sacral areas  Patient tolerated the procedure well with improvement in symptoms  Patient given exercises, stretches and lifestyle modifications  See medications in patient instructions if given  Patient will follow up in 4-8 weeks 

## 2019-05-28 NOTE — Progress Notes (Signed)
[name] [place], [place] [address] Phone: [phone] Subjective:    I'm seeing this patient by the request  of:    CC: Neck and back pain  OZH:[id number]  Lucas Young is a 63 y.o. male coming in with complaint of neck and back pain. Left sided cervical spine pain. Having tightness into the based of the skull. Is getting headaches.  Worsening anxiety at this point.  Using her hydroxyzine multiple times.  States that the seems to be giving away of his work and somewhat of his home life.  Patient denies any true depression though.  Denies any suicidal or homicidal ideation.    Past Medical History:  Diagnosis Date  . Allergy    Past Surgical History:  Procedure Laterality Date  . APPENDECTOMY     Social History   Socioeconomic History  . Marital status: Married    Spouse name: Not on file  . Number of children: Not on file  . Years of education: Not on file  . Highest education level: Not on file  Occupational History  . Not on file  Social Needs  . Financial resource strain: Not on file  . Food insecurity    Worry: Not on file    Inability: Not on file  . Transportation needs    Medical: Not on file    Non-medical: Not on file  Tobacco Use  . Smoking status: Never Smoker  . Smokeless tobacco: Never Used  Substance and Sexual Activity  . Alcohol use: No    Alcohol/week: 0.0 standard drinks  . Drug use: No  . Sexual activity: Not on file  Lifestyle  . Physical activity    Days per week: Not on file    Minutes per session: Not on file  . Stress: Not on file  Relationships  . Social Herbalist on phone: Not on file    Gets together: Not on file    Attends religious service: Not on file    Active member of club or organization: Not on file    Attends meetings of clubs or organizations: Not on file    Relationship status: Not on file  Other Topics Concern  . Not on file  Social History  Narrative  . Not on file   No Known Allergies Family History  Problem Relation Age of Onset  . Hypertension Mother   . Hypertension Father   . Hyperlipidemia Father   . Hypertension Maternal Grandmother   . Hypertension Maternal Grandfather       Current Outpatient Medications (Respiratory):  .  albuterol (PROVENTIL HFA;VENTOLIN HFA) 108 (90 Base) MCG/ACT inhaler, Inhale 2 puffs into the lungs every 4 (four) hours as needed for wheezing or shortness of breath. .  Azelastine-Fluticasone (DYMISTA NA), Place into the nose. Marland Kitchen  desloratadine (CLARINEX) 5 MG tablet, Take 5 mg by mouth daily. .  montelukast (SINGULAIR) 10 MG tablet, Take 10 mg by mouth at bedtime.    Current Outpatient Medications (Other):  .  hydrOXYzine (ATARAX/VISTARIL) 10 MG tablet, Take 1 tablet (10 mg total) by mouth 3 (three) times daily as needed. .  traZODone (DESYREL) 50 MG tablet, TAKE 1 TABLET BY MOUTH AT BEDTIME IF NEEDED FOR SLEEP. Marland Kitchen  venlafaxine XR (EFFEXOR XR) 37.5 MG 24 hr capsule, Take 1 capsule (37.5 mg total) by mouth daily with breakfast.    Past medical history, social, surgical and family history all reviewed in electronic  medical record.  No pertanent information unless stated regarding to the chief complaint.   Review of Systems:  No headache, visual changes, nausea, vomiting, diarrhea, constipation, dizziness, abdominal pain, skin rash, fevers, chills, night sweats, weight loss, swollen lymph nodes, body aches, joint swelling,  chest pain, shortness of breath.  Positive muscle aches, mood changes  Objective  Blood pressure (!) 148/118, pulse 75, height 5\' 7"  (1.702 m), weight 218 lb (98.9 kg), SpO2 97 %.   General: No apparent distress alert and oriented x3 mood and affect normal, dressed appropriately.  Patient does appear to be fairly stressed. HEENT: Pupils equal, extraocular movements intact  Respiratory: Patient's speak in full sentences and does not appear short of breath   Cardiovascular: No lower extremity edema, non tender, no erythema  Skin: Warm dry intact with no signs of infection or rash on extremities or on axial skeleton.  Abdomen: Soft nontender  Neuro: Cranial nerves II through XII are intact, neurovascularly intact in all extremities with 2+ DTRs and 2+ pulses.  Lymph: No lymphadenopathy of posterior or anterior cervical chain or axillae bilaterally.  Gait normal with good balance and coordination.  MSK:  Non tender with full range of motion and good stability and symmetric strength and tone of shoulders, elbows, wrist, hip, knee and ankles bilaterally.   Back exam does have some mild loss of lordosis.  Tightness in the sacral area mostly on the right greater than left.  Positive Corky Sox.  Negative straight leg test.  Patient does have near full range of motion lacking last 5 degrees of extension  Osteopathic findings  C2 flexed rotated and side bent right C4 flexed rotated and side bent left C7 flexed rotated and side bent left T3 extended rotated and side bent right inhaled third rib T8 extended rotated and side bent left L2 flexed rotated and side bent right Sacrum right on right      Impression and Recommendations:     This case required medical decision making of moderate complexity. The above documentation has been reviewed and is accurate and complete Lucas Pulley, DO       Note: This dictation was prepared with Dragon dictation along with smaller phrase technology. Any transcriptional errors that result from this process are unintentional.

## 2019-05-28 NOTE — Patient Instructions (Signed)
Hydroxizine at needed Started on effexor See me in 3-4 weeks

## 2019-05-28 NOTE — Assessment & Plan Note (Signed)
Multifactorial.  Continue significant difficulties.  We discussed home exercises and icing regimen.  Discussed core stability and strengthening again.  Patient will continue this.  Patient has not been doing his yoga and I do think it was helping before.  Patient will follow-up again 4 to 8 weeks.

## 2019-06-26 ENCOUNTER — Encounter: Payer: Self-pay | Admitting: Family Medicine

## 2019-06-26 ENCOUNTER — Ambulatory Visit: Payer: BC Managed Care – PPO | Admitting: Family Medicine

## 2019-06-26 VITALS — BP 110/72 | HR 70 | Ht 67.0 in

## 2019-06-26 DIAGNOSIS — M545 Low back pain, unspecified: Secondary | ICD-10-CM

## 2019-06-26 DIAGNOSIS — M999 Biomechanical lesion, unspecified: Secondary | ICD-10-CM | POA: Diagnosis not present

## 2019-06-26 MED ORDER — VENLAFAXINE HCL ER 37.5 MG PO CP24: 37.5000 mg | ORAL_CAPSULE | Freq: Every day | ORAL | 1 refills | Status: DC

## 2019-06-26 NOTE — Progress Notes (Signed)
[name] [place], [place] [address] Phone: [phone] Subjective:      CC: Low back pain follow-up  [id number]  Lucas Young is a 63 y.o. male coming in with complaint of low back pain.  Patient has been doing relatively well with conservative therapy.  Still has tightness.  Patient's work environment has been more virtual and is working on a more regular basis of trying to get up and move.  Patient denies any radiation down the legs or any numbness or tingling.  Has been having some upper back tightness the past 3 days.       Past Medical History:  Diagnosis Date  . Allergy    Past Surgical History:  Procedure Laterality Date  . APPENDECTOMY     Social History   Socioeconomic History  . Marital status: Married    Spouse name: Not on file  . Number of children: Not on file  . Years of education: Not on file  . Highest education level: Not on file  Occupational History  . Not on file  Social Needs  . Financial resource strain: Not on file  . Food insecurity    Worry: Not on file    Inability: Not on file  . Transportation needs    Medical: Not on file    Non-medical: Not on file  Tobacco Use  . Smoking status: Never Smoker  . Smokeless tobacco: Never Used  Substance and Sexual Activity  . Alcohol use: No    Alcohol/week: 0.0 standard drinks  . Drug use: No  . Sexual activity: Not on file  Lifestyle  . Physical activity    Days per week: Not on file    Minutes per session: Not on file  . Stress: Not on file  Relationships  . Social Herbalist on phone: Not on file    Gets together: Not on file    Attends religious service: Not on file    Active member of club or organization: Not on file    Attends meetings of clubs or organizations: Not on file    Relationship status: Not on file  Other Topics Concern  . Not on file  Social History Narrative  . Not on file   No Known Allergies  Family History  Problem Relation Age of Onset  . Hypertension Mother   . Hypertension Father   . Hyperlipidemia Father   . Hypertension Maternal Grandmother   . Hypertension Maternal Grandfather       Current Outpatient Medications (Respiratory):  .  albuterol (PROVENTIL HFA;VENTOLIN HFA) 108 (90 Base) MCG/ACT inhaler, Inhale 2 puffs into the lungs every 4 (four) hours as needed for wheezing or shortness of breath. .  Azelastine-Fluticasone (DYMISTA NA), Place into the nose. Marland Kitchen  desloratadine (CLARINEX) 5 MG tablet, Take 5 mg by mouth daily. .  montelukast (SINGULAIR) 10 MG tablet, Take 10 mg by mouth at bedtime.    Current Outpatient Medications (Other):  .  hydrOXYzine (ATARAX/VISTARIL) 10 MG tablet, Take 1 tablet (10 mg total) by mouth 3 (three) times daily as needed. .  traZODone (DESYREL) 50 MG tablet, TAKE 1 TABLET BY MOUTH AT BEDTIME IF NEEDED FOR SLEEP. Marland Kitchen  venlafaxine XR (EFFEXOR XR) 37.5 MG 24 hr capsule, Take 1 capsule (37.5 mg total) by mouth daily with breakfast.    Past medical history, social, surgical and family history all reviewed in electronic medical record.  No pertanent information  unless stated regarding to the chief complaint.   Review of Systems:  No headache, visual changes, nausea, vomiting, diarrhea, constipation, dizziness, abdominal pain, skin rash, fevers, chills, night sweats, weight loss, swollen lymph nodes, body aches, joint swelling, muscle aches, chest pain, shortness of breath, mood changes.   Objective  Blood pressure 110/72, pulse 70, height 5\' 7"  (1.702 m), SpO2 97 %.    General: No apparent distress alert and oriented x3 mood and affect normal, dressed appropriately.  HEENT: Pupils equal, extraocular movements intact  Respiratory: Patient's speak in full sentences and does not appear short of breath  Cardiovascular: No lower extremity edema, non tender, no erythema  Skin: Warm dry intact with no signs of infection or rash on extremities  or on axial skeleton.  Abdomen: Soft nontender  Neuro: Cranial nerves II through XII are intact, neurovascularly intact in all extremities with 2+ DTRs and 2+ pulses.  Lymph: No lymphadenopathy of posterior or anterior cervical chain or axillae bilaterally.  Gait normal with good balance and coordination.  MSK:  Non tender with full range of motion and good stability and symmetric strength and tone of shoulders, elbows, wrist, hip, knee and ankles bilaterally.  Back Exam:  Inspection: Unremarkable  Motion: Flexion 40 deg, Extension 25 deg, Side Bending to 40 deg bilaterally,  Rotation to 45 deg bilaterally  SLR laying: Negative  XSLR laying: Negative  Palpable tenderness: Tender to palpation of paraspinal musculature.Marland Kitchen FABER: negative. Sensory change: Gross sensation intact to all lumbar and sacral dermatomes.  Reflexes: 2+ at both patellar tendons, 2+ at achilles tendons, Babinski's downgoing.  Strength at foot  Plantar-flexion: 5/5 Dorsi-flexion: 5/5 Eversion: 5/5 Inversion: 5/5  Leg strength  Quad: 5/5 Hamstring: 5/5 Hip flexor: 5/5 Hip abductors: 5/5  Gait unremarkable.  Osteopathic findings C2 flexed rotated and side bent right C6 flexed rotated and side bent left T3 extended rotated and side bent right inhaled third rib T7 extended rotated and side bent left L2 flexed rotated and side bent right Sacrum right on right  Impression and Recommendations:     This case required medical decision making of moderate complexity. The above documentation has been reviewed and is accurate and complete [name], DO       Note: This dictation was prepared with Dragon dictation along with smaller phrase technology. Any transcriptional errors that result from this process are unintentional.

## 2019-06-26 NOTE — Patient Instructions (Signed)
See me again in 4-6 weeks  

## 2019-06-26 NOTE — Assessment & Plan Note (Signed)
Decision today to treat with OMT was based on Physical Exam  After verbal consent patient was treated with HVLA, ME, FPR techniques in cervical, thoracic, rib,  lumbar and sacral areas  Patient tolerated the procedure well with improvement in symptoms  Patient given exercises, stretches and lifestyle modifications  See medications in patient instructions if given  Patient will follow up in 4-8 weeks 

## 2019-06-26 NOTE — Assessment & Plan Note (Signed)
Continues to have more muscle imbalances.  Discussed posture and ergonomics.  Discussed home exercises, discussed which activities of doing which wants to avoid.  Patient is to increase activity slowly over the course the neck several days.  Follow-up again in 4 to 8 weeks

## 2019-07-11 ENCOUNTER — Other Ambulatory Visit: Payer: Self-pay | Admitting: Family Medicine

## 2019-07-30 NOTE — Progress Notes (Signed)
[name] [place], [place] [address] Phone: [phone] Subjective:   I Lucas Young am serving as a Education administrator for Dr. Hulan Saas.  I'm seeing this patient by the request  of:    CC: Back pain follow-up, recent ankle injury  [id number]  Lucas Young is a 63 y.o. male coming in with complaint of right ankle pain. Patient rolled his ankle stepping off the edge of the side walk. Fell on his left side and left knee. Swollen. Recently cut the tip of his finger off. Believes he has had a lack of focus recently that has caused both injuries.   Onset- last night  Location - lateral  Duration-  Character-aching sensation with sharp pain in certain areas Aggravating factors-weightbearing Reliving factors-ice and ibuprofen Therapies tried- ice, pennsaid  Severity-7 out of 10     Past Medical History:  Diagnosis Date  . Allergy    Past Surgical History:  Procedure Laterality Date  . APPENDECTOMY     Social History   Socioeconomic History  . Marital status: Married    Spouse name: Not on file  . Number of children: Not on file  . Years of education: Not on file  . Highest education level: Not on file  Occupational History  . Not on file  Social Needs  . Financial resource strain: Not on file  . Food insecurity    Worry: Not on file    Inability: Not on file  . Transportation needs    Medical: Not on file    Non-medical: Not on file  Tobacco Use  . Smoking status: Never Smoker  . Smokeless tobacco: Never Used  Substance and Sexual Activity  . Alcohol use: No    Alcohol/week: 0.0 standard drinks  . Drug use: No  . Sexual activity: Not on file  Lifestyle  . Physical activity    Days per week: Not on file    Minutes per session: Not on file  . Stress: Not on file  Relationships  . Social Herbalist on phone: Not on file    Gets together: Not on file    Attends religious service: Not on file   Active member of club or organization: Not on file    Attends meetings of clubs or organizations: Not on file    Relationship status: Not on file  Other Topics Concern  . Not on file  Social History Narrative  . Not on file   No Known Allergies Family History  Problem Relation Age of Onset  . Hypertension Mother   . Hypertension Father   . Hyperlipidemia Father   . Hypertension Maternal Grandmother   . Hypertension Maternal Grandfather       Current Outpatient Medications (Respiratory):  .  albuterol (PROVENTIL HFA;VENTOLIN HFA) 108 (90 Base) MCG/ACT inhaler, Inhale 2 puffs into the lungs every 4 (four) hours as needed for wheezing or shortness of breath. .  Azelastine-Fluticasone (DYMISTA NA), Place into the nose. Marland Kitchen  desloratadine (CLARINEX) 5 MG tablet, Take 5 mg by mouth daily. .  montelukast (SINGULAIR) 10 MG tablet, Take 10 mg by mouth at bedtime.    Current Outpatient Medications (Other):  .  hydrOXYzine (ATARAX/VISTARIL) 10 MG tablet, Take 1 tablet (10 mg total) by mouth 3 (three) times daily as needed. .  traZODone (DESYREL) 50 MG tablet, TAKE 1 TABLET BY MOUTH AT BEDTIME IF NEEDED FOR SLEEP. Marland Kitchen  venlafaxine XR (EFFEXOR XR) 37.5  MG 24 hr capsule, Take 1 capsule (37.5 mg total) by mouth daily with breakfast. .  tiZANidine (ZANAFLEX) 4 MG capsule, 1 tablet at night    Past medical history, social, surgical and family history all reviewed in electronic medical record.  No pertanent information unless stated regarding to the chief complaint.   Review of Systems:  No headache, visual changes, nausea, vomiting, diarrhea, constipation, dizziness, abdominal pain, skin rash, fevers, chills, night sweats, weight loss, swollen lymph nodes, body aches, joint swelling,chest pain, shortness of breath, mood changes.  Positive muscle aches  Objective  Blood pressure 140/80, pulse 100, height 5\' 7"  (1.702 m), SpO2 95 %.    General: No apparent distress alert and oriented x3 mood  and affect normal, dressed appropriately.  HEENT: Pupils equal, extraocular movements intact  Respiratory: Patient's speak in full sentences and does not appear short of breath  Cardiovascular: No lower extremity edema, non tender, no erythema  Skin: Warm dry intact with no signs of infection or rash on extremities or on axial skeleton.  Abdomen: Soft nontender  Neuro: Cranial nerves II through XII are intact, neurovascularly intact in all extremities with 2+ DTRs and 2+ pulses.  Lymph: No lymphadenopathy of posterior or anterior cervical chain or axillae bilaterally.  Gait antalgic favoring right ankle.  MSK:  tender with full range of motion and good stability and symmetric strength and tone of shoulders, elbows, wrist, hip, knee bilaterally.  Right ankle exam does have some swelling over the lateral malleolus area.  Some mild swelling over the talar dome.  Tender to palpation only laterally no pain medially.  Neurovascular intact distally.  Pain with stressing of the ATFL but negative anterior drawer.  Back exam does have some loss of lordosis.  Tender to palpation diffusely in the paraspinal musculature lumbar spine right and left.  No spinous process tenderness.  Lacks last 10 degrees of extension lacks 5 degrees of flexion.  Tightness with Corky Sox bilaterally.  Osteopathic findings C6 flexed rotated and side bent left T3 extended rotated and side bent right inhaled third rib T7 extended rotated and side bent left L2 flexed rotated and side bent right Sacrum right on right  Limited musculoskeletal ultrasound was performed and interpreted by [name]  Limited ultrasound shows the patient does have significant soft tissue swelling laterally of the ankle.  Patient does not have any cortical defect but potentially small contusion noted.  ATFL does appear intact but difficult to secondary to see the full length secondary to the inflammation.  Patient does have a small effusion of the talar  dome and ankle joint. Impression: Ankle sprain with effusion    Impression and Recommendations:     This case required medical decision making of moderate complexity. The above documentation has been reviewed and is accurate and complete [name], DO       Note: This dictation was prepared with Dragon dictation along with smaller phrase technology. Any transcriptional errors that result from this process are unintentional.

## 2019-07-31 ENCOUNTER — Ambulatory Visit: Payer: Self-pay

## 2019-07-31 ENCOUNTER — Encounter: Payer: Self-pay | Admitting: Family Medicine

## 2019-07-31 ENCOUNTER — Other Ambulatory Visit: Payer: Self-pay

## 2019-07-31 ENCOUNTER — Ambulatory Visit: Payer: BC Managed Care – PPO | Admitting: Family Medicine

## 2019-07-31 VITALS — BP 140/80 | HR 100 | Ht 67.0 in

## 2019-07-31 DIAGNOSIS — M533 Sacrococcygeal disorders, not elsewhere classified: Secondary | ICD-10-CM

## 2019-07-31 DIAGNOSIS — M25571 Pain in right ankle and joints of right foot: Secondary | ICD-10-CM

## 2019-07-31 DIAGNOSIS — M999 Biomechanical lesion, unspecified: Secondary | ICD-10-CM | POA: Diagnosis not present

## 2019-07-31 DIAGNOSIS — S93401A Sprain of unspecified ligament of right ankle, initial encounter: Secondary | ICD-10-CM | POA: Insufficient documentation

## 2019-07-31 DIAGNOSIS — S93491A Sprain of other ligament of right ankle, initial encounter: Secondary | ICD-10-CM

## 2019-07-31 MED ORDER — TIZANIDINE HCL 4 MG PO CAPS: ORAL_CAPSULE | ORAL | 0 refills | Status: DC

## 2019-07-31 NOTE — Assessment & Plan Note (Signed)
Ankle sprain noted.  Patient does have swelling noted in this as well as the talar dome but no true cortical defect noted on ultrasound.  Patient given the recommendation of an x-ray which patient declined.  Patient will try Aircast and home exercises and work with Product/process development scientist.  Follow-up again in 3 weeks for further evaluation

## 2019-07-31 NOTE — Assessment & Plan Note (Signed)
Stable, normal discomfort and pain with certain activities.  Discussed posture and ergonomics.  Recent fall likely contributed.  Given some Zanaflex for breakthrough pain at night.  Has done well with the trazodone.  Discussed icing regimen.  We discussed continuing the Effexor.  Follow-up again in 4 to 6 weeks.  Will see patient actually in 3 weeks secondary to the ankle inversion injury.

## 2019-07-31 NOTE — Patient Instructions (Signed)
Good to see you 3 Ibuprofen 3 times a day for 3 days  Exercise 3 times a week  Aircast daily for 2 weeks Ice 20 minutes 2 times daily. Usually after activity and before bed. See me again in 3 weeks

## 2019-07-31 NOTE — Assessment & Plan Note (Signed)
Decision today to treat with OMT was based on Physical Exam  After verbal consent patient was treated with HVLA, ME, FPR techniques in cervical, thoracic, rib lumbar and sacral areas  Patient tolerated the procedure well with improvement in symptoms  Patient given exercises, stretches and lifestyle modifications  See medications in patient instructions if given  Patient will follow up in 4-6 weeks 

## 2019-08-21 ENCOUNTER — Other Ambulatory Visit: Payer: Self-pay

## 2019-08-21 ENCOUNTER — Ambulatory Visit (INDEPENDENT_AMBULATORY_CARE_PROVIDER_SITE_OTHER): Payer: BC Managed Care – PPO | Admitting: Family Medicine

## 2019-08-21 ENCOUNTER — Encounter: Payer: Self-pay | Admitting: Family Medicine

## 2019-08-21 VITALS — BP 118/88 | HR 84 | Ht 67.0 in | Wt 219.0 lb

## 2019-08-21 DIAGNOSIS — S93491D Sprain of other ligament of right ankle, subsequent encounter: Secondary | ICD-10-CM

## 2019-08-21 DIAGNOSIS — M999 Biomechanical lesion, unspecified: Secondary | ICD-10-CM | POA: Diagnosis not present

## 2019-08-21 DIAGNOSIS — M545 Low back pain, unspecified: Secondary | ICD-10-CM

## 2019-08-21 NOTE — Progress Notes (Signed)
[name] [place], [place] [address] Phone: [phone] Subjective:   Lucas Lucas [name], am serving as a scribe for Dr. Hulan Saas.  I'm seeing this patient by the request  of:    CC: Back and neck pain follow-up  [id number]   08/03/2019 Ankle sprain noted.  Patient does have swelling noted in this as well as the talar dome but Lucas [name] true cortical defect noted on ultrasound.  Patient given the recommendation of an x-ray which patient declined.  Patient will try Aircast and home exercises and work with Product/process development scientist.  Follow-up again in 3 weeks for further evaluation  Update 08/21/2019 Lucas Lucas [name] is a 63 y.o. male coming in with complaint of back pain and right ankle pain. Patient states that he has been doing HEP. Has been hiking without pain.  Patient states that the ankle is doing well and feels like he is 90% better.  Right sided thoracic spine pain today. Is here for OMT.  Has responded well to manipulation.  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  Patient denies though any radiation to any of the extremities.     Past Medical History:  Diagnosis Date  . Allergy    Past Surgical History:  Procedure Laterality Date  . APPENDECTOMY     Social History   Socioeconomic History  . Marital status: Married    Spouse name: Not on file  . Number of children: Not on file  . Years of education: Not on file  . Highest education level: Not on file  Occupational History  . Not on file  Social Needs  . Financial resource strain: Not on file  . Food insecurity    Worry: Not on file    Inability: Not on file  . Transportation needs    Medical: Not on file    Non-medical: Not on file  Tobacco Use  . Smoking status: Never Smoker  . Smokeless tobacco: Never Used  Substance and Sexual Activity  . Alcohol use: Lucas [name]   Alcohol/week: 0.0 standard drinks  . Drug use: Lucas [name]  . Sexual activity: Not on file  Lifestyle  . Physical activity    Days per week: Not on file    Minutes per session: Not on file  . Stress: Not on file  Relationships  . Social Herbalist on phone: Not on file    Gets together: Not on file    Attends religious service: Not on file    Active member of club or organization: Not on file    Attends meetings of clubs or organizations: Not on file    Relationship status: Not on file  Other Topics Concern  . Not on file  Social History Narrative  . Not on file   Lucas [name] Known Allergies Family History  Problem Relation Age of Onset  . Hypertension Mother   . Hypertension Father   . Hyperlipidemia Father   . Hypertension Maternal Grandmother   . Hypertension Maternal Grandfather       Current Outpatient Medications (Respiratory):  .  albuterol (PROVENTIL HFA;VENTOLIN HFA) 108 (90 Base) MCG/ACT inhaler, Inhale 2 puffs into the lungs every 4 (four) hours as needed for wheezing or shortness of breath. .  Azelastine-Fluticasone (DYMISTA NA), Place into the nose. Marland Kitchen  desloratadine (CLARINEX) 5 MG tablet, Take 5 mg  by mouth daily. .  montelukast (SINGULAIR) 10 MG tablet, Take 10 mg by mouth at bedtime.    Current Outpatient Medications (Other):  .  hydrOXYzine (ATARAX/VISTARIL) 10 MG tablet, Take 1 tablet (10 mg total) by mouth 3 (three) times daily as needed. Marland Kitchen  tiZANidine (ZANAFLEX) 4 MG capsule, 1 tablet at night .  traZODone (DESYREL) 50 MG tablet, TAKE 1 TABLET BY MOUTH AT BEDTIME IF NEEDED FOR SLEEP. Marland Kitchen  venlafaxine XR (EFFEXOR XR) 37.5 MG 24 hr capsule, Take 1 capsule (37.5 mg total) by mouth daily with breakfast.    Past medical history, social, surgical and family history all reviewed in electronic medical record.  Lucas [name] pertanent information unless stated regarding to the chief complaint.   Review of Systems:  Lucas [name] headache, visual changes, nausea, vomiting, diarrhea,  constipation, dizziness, abdominal pain, skin rash, fevers, chills, night sweats, weight loss, swollen lymph nodes, body aches, joint swelling, muscle aches, chest pain, shortness of breath, mood changes.   Objective  Blood pressure 118/88, pulse 84, height 5\' 7"  (1.702 m), weight 219 lb (99.3 kg), SpO2 99 %.   General: Lucas [name] apparent distress alert and oriented x3 mood and affect normal, dressed appropriately.  HEENT: Pupils equal, extraocular movements intact  Respiratory: Patient's speak in full sentences and does not appear short of breath  Cardiovascular: Lucas [name] lower extremity edema, non tender, Lucas [name] erythema  Skin: Warm dry intact with Lucas [name] signs of infection or rash on extremities or on axial skeleton.  Abdomen: Soft nontender  Neuro: Cranial nerves II through XII are intact, neurovascularly intact in all extremities with 2+ DTRs and 2+ pulses.  Lymph: Lucas [name] lymphadenopathy of posterior or anterior cervical chain or axillae bilaterally.  Gait normal with good balance and coordination.  MSK:  Non tender with full range of motion and good stability and symmetric strength and tone of shoulders, elbows, wrist, hip, knee and ankles bilaterally.   Neck: Inspection loss of lordosis. Lucas [name] palpable stepoffs. Negative Spurling's maneuver. Motion of sidebending Grip strength and sensation normal in bilateral hands Strength good C4 to T1 distribution Lucas [name] sensory change to C4 to T1 Negative Hoffman sign bilaterally Reflexes normal Tightness of the trapezius right greater than left  Lower back some loss of lordosis.  Tenderness none paraspinal musculature.Positive FABER   Osteopathic findings  C2 flexed rotated and side bent right C6 flexed rotated and side bent left T7 extended rotated and side bent right inhaled rib T9 extended rotated and side bent left L1 flexed rotated and side bent right Sacrum right on right    Impression and Recommendations:     This case required medical decision making  of moderate complexity. The above documentation has been reviewed and is accurate and complete Lucas Pulley, DO       Note: This dictation was prepared with Dragon dictation along with smaller phrase technology. Any transcriptional errors that result from this process are unintentional.

## 2019-08-21 NOTE — Assessment & Plan Note (Signed)
Decision today to treat with OMT was based on Physical Exam  After verbal consent patient was treated with HVLA, ME, FPR techniques in cervical, thoracic, rib,  lumbar and sacral areas  Patient tolerated the procedure well with improvement in symptoms  Patient given exercises, stretches and lifestyle modifications  See medications in patient instructions if given  Patient will follow up in 4-8 weeks 

## 2019-08-21 NOTE — Assessment & Plan Note (Signed)
Stable, a little tighter. HEP  Discussed which activities to do and which ones to avoid  RTC in 6 weeks

## 2019-08-21 NOTE — Patient Instructions (Signed)
Watch the ankle Consider compression  See me in 6 weeks

## 2019-08-21 NOTE — Assessment & Plan Note (Signed)
Doing well overall.  Discussed which activities of doing close avoid.  Patient may have the potential small cyst of the ankle that may need to be monitored.  Patient will follow-up with me again as needed for this problem.

## 2019-09-25 ENCOUNTER — Telehealth: Payer: Self-pay | Admitting: *Deleted

## 2019-09-25 NOTE — Telephone Encounter (Signed)
Pt left msg stating he has shingles & is requesting a rx be sent into the pharmacy.  I think pt may need to see his PCP.

## 2019-09-25 NOTE — Telephone Encounter (Signed)
Spoke with patient that he will need to follow up with PCP for shingles diagnosis/treatment. Patient voices understanding.

## 2019-10-01 ENCOUNTER — Other Ambulatory Visit: Payer: Self-pay

## 2019-10-01 ENCOUNTER — Ambulatory Visit: Payer: BC Managed Care – PPO | Admitting: Family Medicine

## 2019-10-01 ENCOUNTER — Encounter: Payer: Self-pay | Admitting: Family Medicine

## 2019-10-01 VITALS — BP 140/98 | HR 76 | Ht 67.0 in | Wt 217.0 lb

## 2019-10-01 DIAGNOSIS — M545 Low back pain, unspecified: Secondary | ICD-10-CM

## 2019-10-01 DIAGNOSIS — M999 Biomechanical lesion, unspecified: Secondary | ICD-10-CM

## 2019-10-01 NOTE — Assessment & Plan Note (Signed)
Patient's lumbar back pain seems to be multifactorial, patient does have poor core strength still carrying extra weight.  Patient also recently did have a bout of shingles that likely contributed to some of the discomfort and pain and more of a neurogenic aspect now.  Patient still responded well to osteopathic manipulation.  Patient does have muscle relaxers for breakthrough, follow-up with me again 4 to 8 weeks.

## 2019-10-01 NOTE — Assessment & Plan Note (Signed)
Decision today to treat with OMT was based on Physical Exam  After verbal consent patient was treated with HVLA, ME, FPR techniques in cervical, thoracic, rib lumbar and sacral areas  Patient tolerated the procedure well with improvement in symptoms  Patient given exercises, stretches and lifestyle modifications  See medications in patient instructions if given  Patient will follow up in 4-8 weeks 

## 2019-10-01 NOTE — Patient Instructions (Signed)
Keep taking time for yourself See me in 5-6 weeks

## 2019-10-01 NOTE — Progress Notes (Signed)
[name] [address] [place], [place] [address] Phone: [phone] Subjective:   Lucas [name], am serving as a scribe for Dr. Hulan Saas.    CC: Back pain follow-up  [id number]   08/21/2019 Doing well overall.  Discussed which activities of doing close avoid.  Patient may have the potential small cyst of the ankle that may need to be monitored.  Patient will follow-up with me again as needed for this problem.  Update 10/01/2019 Lucas [name] is a 63 y.o. male coming in with complaint of right ankle and back pain. Ankle pain has subsided. Patient woke up last week with sharp pain. States that he has shingles.  Patient states that with severe amount of pain.  Has been on antivirals now for some time and is doing better.  Patient still has more back pain on a regular basis.  Trying to do yoga still but is not doing them as regularly as he has done in the past.     Past Medical History:  Diagnosis Date   Allergy    Past Surgical History:  Procedure Laterality Date   APPENDECTOMY     Social History   Socioeconomic History   Marital status: Married    Spouse name: Not on file   Number of children: Not on file   Years of education: Not on file   Highest education level: Not on file  Occupational History   Not on file  Social Needs   Financial resource strain: Not on file   Food insecurity    Worry: Not on file    Inability: Not on file   Transportation needs    Medical: Not on file    Non-medical: Not on file  Tobacco Use   Smoking status: Never Smoker   Smokeless tobacco: Never Used  Substance and Sexual Activity   Alcohol use: [name]    Alcohol/week: 0.0 standard drinks   Drug use: [name]   Sexual activity: Not on file  Lifestyle   Physical activity    Days per week: Not on file    Minutes per session: Not on file   Stress: Not on file  Relationships   Social connections    Talks on phone: Not on  file    Gets together: Not on file    Attends religious service: Not on file    Active member of club or organization: Not on file    Attends meetings of clubs or organizations: Not on file    Relationship status: Not on file  Other Topics Concern   Not on file  Social History Narrative   Not on file   [name] Known Allergies Family History  Problem Relation Age of Onset   Hypertension Mother    Hypertension Father    Hyperlipidemia Father    Hypertension Maternal Grandmother    Hypertension Maternal Grandfather       Current Outpatient Medications (Respiratory):    albuterol (PROVENTIL HFA;VENTOLIN HFA) 108 (90 Base) MCG/ACT inhaler, Inhale 2 puffs into the lungs every 4 (four) hours as needed for wheezing or shortness of breath.   Azelastine-Fluticasone (DYMISTA NA), Place into the nose.   desloratadine (CLARINEX) 5 MG tablet, Take 5 mg by mouth daily.   montelukast (SINGULAIR) 10 MG tablet, Take 10 mg by mouth at bedtime.    Current Outpatient Medications (Other):    hydrOXYzine (ATARAX/VISTARIL) 10 MG tablet, Take 1 tablet (10 mg total) by mouth 3 (three) times  daily as needed.   tiZANidine (ZANAFLEX) 4 MG capsule, 1 tablet at night   traZODone (DESYREL) 50 MG tablet, TAKE 1 TABLET BY MOUTH AT BEDTIME IF NEEDED FOR SLEEP.   venlafaxine XR (EFFEXOR XR) 37.5 MG 24 hr capsule, Take 1 capsule (37.5 mg total) by mouth daily with breakfast.    Past medical history, social, surgical and family history all reviewed in electronic medical record.  [name] pertanent information unless stated regarding to the chief complaint.   Review of Systems:  [name] headache, visual changes, nausea, vomiting, diarrhea, constipation, dizziness, abdominal pain, skin rash, fevers, chills, night sweats, weight loss, swollen lymph nodes, body aches, joint swelling, muscle aches, chest pain, shortness of breath, mood changes.   Objective  Blood pressure (!) 140/98, pulse 76, height 5\' 7"  (1.702  m), weight 217 lb (98.4 kg), SpO2 98 %. S   General: [name] apparent distress alert and oriented x3 mood and affect normal, dressed appropriately.  HEENT: Pupils equal, extraocular movements intact  Respiratory: Patient's speak in full sentences and does not appear short of breath  Cardiovascular: [name] lower extremity edema, non tender, [name] erythema  Skin: Warm dry intact with [name] signs of infection or rash on extremities or on axial skeleton.  Abdomen: Soft nontender overweight Neuro: Cranial nerves II through XII are intact, neurovascularly intact in all extremities with 2+ DTRs and 2+ pulses.  Lymph: [name] lymphadenopathy of posterior or anterior cervical chain or axillae bilaterally.  Gait normal with good balance and coordination.  MSK:  Non tender with full range of motion and good stability and symmetric strength and tone of shoulders, elbows, wrist, hip, knee and ankles bilaterally.   Low back exam on inspection shows the patient does have resolving shingles in a dermatome on the left lateral flank.  Patient nontender in the area.  Patient is tender though in the paraspinal musculature around the sacroiliac joint bilaterally right greater than left.  Negative straight leg test.  Mild tightness of the hamstrings bilaterally  Osteopathic findings C4 flexed rotated and side bent left C7 flexed rotated and side bent left T3 extended rotated and side bent right inhaled third rib T5 extended rotated and side bent left L2 flexed rotated and side bent right Sacrum right on right     Impression and Recommendations:     This case required medical decision making of moderate complexity. The above documentation has been reviewed and is accurate and complete [name], DO       Note: This dictation was prepared with Dragon dictation along with smaller phrase technology. Any transcriptional errors that result from this process are unintentional.

## 2019-10-08 ENCOUNTER — Encounter: Payer: Self-pay | Admitting: Family Medicine

## 2019-10-08 MED ORDER — VENLAFAXINE HCL ER 37.5 MG PO CP24: 37.5000 mg | ORAL_CAPSULE | Freq: Every day | ORAL | 1 refills | Status: DC

## 2019-11-07 ENCOUNTER — Encounter: Payer: Self-pay | Admitting: Family Medicine

## 2019-11-07 ENCOUNTER — Other Ambulatory Visit: Payer: Self-pay

## 2019-11-07 ENCOUNTER — Ambulatory Visit: Payer: BC Managed Care – PPO | Admitting: Family Medicine

## 2019-11-07 VITALS — BP 110/78 | HR 75 | Ht 67.0 in | Wt 217.0 lb

## 2019-11-07 DIAGNOSIS — M545 Low back pain, unspecified: Secondary | ICD-10-CM

## 2019-11-07 DIAGNOSIS — M999 Biomechanical lesion, unspecified: Secondary | ICD-10-CM | POA: Diagnosis not present

## 2019-11-07 NOTE — Progress Notes (Signed)
[name] [place], [place] [address] Phone: [phone] Subjective:   Lucas Young, am serving as a scribe for Dr. Hulan Saas.  This visit occurred during the SARS-CoV-2 public health emergency.  Safety protocols were in place, including screening questions prior to the visit, additional usage of staff PPE, and extensive cleaning of exam room while observing appropriate contact time as indicated for disinfecting solutions.  I'm seeing this patient by the request  of:    CC: back pain follow up   [id number]  Lucas Young is a 63 y.o. male coming in with complaint of back pain. Last seen on 10/01/2019. States that his back pain has not changed since last visit.  Mild tightness.  Not can to be on a computer on a regular basis at the moment.      Past Medical History:  Diagnosis Date  . Allergy    Past Surgical History:  Procedure Laterality Date  . APPENDECTOMY     Social History   Socioeconomic History  . Marital status: Married    Spouse name: Not on file  . Number of children: Not on file  . Years of education: Not on file  . Highest education level: Not on file  Occupational History  . Not on file  Tobacco Use  . Smoking status: Never Smoker  . Smokeless tobacco: Never Used  Substance and Sexual Activity  . Alcohol use: Young    Alcohol/week: 0.0 standard drinks  . Drug use: Young  . Sexual activity: Not on file  Other Topics Concern  . Not on file  Social History Narrative  . Not on file   Social Determinants of Health   Financial Resource Strain:   . Difficulty of Paying Living Expenses: Not on file  Food Insecurity:   . Worried About Charity fundraiser in the Last Year: Not on file  . Ran Out of Food in the Last Year: Not on file  Transportation Needs:   . Lack of Transportation (Medical): Not on file  . Lack of Transportation (Non-Medical): Not on file  Physical Activity:   . Days of  Exercise per Week: Not on file  . Minutes of Exercise per Session: Not on file  Stress:   . Feeling of Stress : Not on file  Social Connections:   . Frequency of Communication with Friends and Family: Not on file  . Frequency of Social Gatherings with Friends and Family: Not on file  . Attends Religious Services: Not on file  . Active Member of Clubs or Organizations: Not on file  . Attends Archivist Meetings: Not on file  . Marital Status: Not on file   Young Known Allergies Family History  Problem Relation Age of Onset  . Hypertension Mother   . Hypertension Father   . Hyperlipidemia Father   . Hypertension Maternal Grandmother   . Hypertension Maternal Grandfather       Current Outpatient Medications (Respiratory):  .  albuterol (PROVENTIL HFA;VENTOLIN HFA) 108 (90 Base) MCG/ACT inhaler, Inhale 2 puffs into the lungs every 4 (four) hours as needed for wheezing or shortness of breath. .  Azelastine-Fluticasone (DYMISTA NA), Place into the nose. Marland Kitchen  desloratadine (CLARINEX) 5 MG tablet, Take 5 mg by mouth daily. .  montelukast (SINGULAIR) 10 MG tablet, Take 10 mg by mouth at bedtime.    Current Outpatient Medications (Other):  .  hydrOXYzine (ATARAX/VISTARIL) 10 MG tablet, Take 1 tablet (  10 mg total) by mouth 3 (three) times daily as needed. Marland Kitchen  tiZANidine (ZANAFLEX) 4 MG capsule, 1 tablet at night .  traZODone (DESYREL) 50 MG tablet, TAKE 1 TABLET BY MOUTH AT BEDTIME IF NEEDED FOR SLEEP. Marland Kitchen  venlafaxine XR (EFFEXOR XR) 37.5 MG 24 hr capsule, Take 1 capsule (37.5 mg total) by mouth daily with breakfast.    Past medical history, social, surgical and family history all reviewed in electronic medical record.  Young pertanent information unless stated regarding to the chief complaint.   Review of Systems:  Young headache, visual changes, nausea, vomiting, diarrhea, constipation, dizziness, abdominal pain, skin rash, fevers, chills, night sweats, weight loss, swollen lymph  nodes, body aches, joint swelling, muscle aches, chest pain, shortness of breath, mood changes.   Objective  Blood pressure 110/78, pulse 75, height 5\' 7"  (1.702 m), weight 217 lb (98.4 kg), SpO2 98 %.    General: Young apparent distress alert and oriented x3 mood and affect normal, dressed appropriately.  HEENT: Pupils equal, extraocular movements intact  Respiratory: Patient's speak in full sentences and does not appear short of breath  Cardiovascular: Young lower extremity edema, non tender, Young erythema  Skin: Warm dry intact with Young signs of infection or rash on extremities or on axial skeleton.  Abdomen: Soft nontender  Neuro: Cranial nerves II through XII are intact, neurovascularly intact in all extremities with 2+ DTRs and 2+ pulses.  Lymph: Young lymphadenopathy of posterior or anterior cervical chain or axillae bilaterally.  Gait normal with good balance and coordination.  MSK:  Non tender with full range of motion and good stability and symmetric strength and tone of shoulders, elbows, wrist, hip, knee and ankles bilaterally.  Back Exam:  Inspection: Mild loss of lordosis Motion: Flexion 45 deg, Extension 25 deg, Side Bending to 35 deg bilaterally,  Rotation to 35 deg bilaterally  SLR laying: Negative  XSLR laying: Negative  Palpable tenderness: Tender to palpation in paraspinal musculature lumbar spine right greater than left. FABER: Positive Faber. Sensory change: Gross sensation intact to all lumbar and sacral dermatomes.  Reflexes: 2+ at both patellar tendons, 2+ at achilles tendons, Babinski's downgoing.  Strength at foot  Plantar-flexion: 5/5 Dorsi-flexion: 5/5 Eversion: 5/5 Inversion: 5/5  Leg strength  Quad: 5/5 Hamstring: 5/5 Hip flexor: 5/5 Hip abductors: 5/5  Gait unremarkable.  Osteopathic findings  C4 flexed rotated and side bent left C7 flexed rotated and side bent left T3 extended rotated and side bent right inhaled third rib T11 extended rotated and side bent  left L2 flexed rotated and side bent right Sacrum right on right     Impression and Recommendations:     This case required medical decision making of moderate complexity. The above documentation has been reviewed and is accurate and complete [name], DO       Note: This dictation was prepared with Dragon dictation along with smaller phrase technology. Any transcriptional errors that result from this process are unintentional.

## 2019-11-07 NOTE — Patient Instructions (Signed)
  [address], [address] [place], [place] [address] Phone [phone]  See me in 2 months 

## 2019-11-07 NOTE — Assessment & Plan Note (Signed)
Stable overall.  Continue to work on core strength.  With patient working this up a computer should do relatively better at the moment.  Discussed which activities to do which wants to avoid.  Follow-up again in 4 to 8 weeks.

## 2019-11-07 NOTE — Assessment & Plan Note (Signed)
Decision today to treat with OMT was based on Physical Exam  After verbal consent patient was treated with HVLA, ME, FPR techniques in cervical, thoracic, rib lumbar and sacral areas  Patient tolerated the procedure well with improvement in symptoms  Patient given exercises, stretches and lifestyle modifications  See medications in patient instructions if given  Patient will follow up in 4-8 weeks 

## 2020-01-08 ENCOUNTER — Ambulatory Visit (INDEPENDENT_AMBULATORY_CARE_PROVIDER_SITE_OTHER): Payer: BC Managed Care – PPO | Admitting: Family Medicine

## 2020-01-08 ENCOUNTER — Other Ambulatory Visit: Payer: Self-pay

## 2020-01-08 ENCOUNTER — Encounter: Payer: Self-pay | Admitting: Family Medicine

## 2020-01-08 VITALS — BP 150/90 | HR 74 | Ht 67.0 in | Wt 223.0 lb

## 2020-01-08 DIAGNOSIS — M545 Low back pain, unspecified: Secondary | ICD-10-CM

## 2020-01-08 DIAGNOSIS — M999 Biomechanical lesion, unspecified: Secondary | ICD-10-CM

## 2020-01-08 MED ORDER — VENLAFAXINE HCL ER 37.5 MG PO CP24: 37.5000 mg | ORAL_CAPSULE | Freq: Every day | ORAL | 0 refills | Status: DC

## 2020-01-08 NOTE — Assessment & Plan Note (Signed)
Low back pain with loss of lordosis.  Patient does sound for 1 appears to be more of a sacroiliac dysfunction as well and poor core strength.  This is been a chronic problem with mild exacerbation.  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  Patient is to continue with home exercises and icing regimen.  Discussed posture and ergonomics, patient is taking Effexor and we discussed medical management and medications.  Follow-up again in 4 to 8 weeks

## 2020-01-08 NOTE — Patient Instructions (Signed)
Good to see you Yoga wheel See me again in 4-6 weeks

## 2020-01-08 NOTE — Progress Notes (Signed)
[facility] [address] [place] Phone: [phone] Subjective:   I Lucas Young am serving as a Education administrator for Dr. Hulan Saas.  This visit occurred during the SARS-CoV-2 public health emergency.  Safety protocols were in place, including screening questions prior to the visit, additional usage of staff PPE, and extensive cleaning of exam room while observing appropriate contact time as indicated for disinfecting solutions.   I'm seeing this patient by the request  of:  Patient, No Pcp Per  CC: Low back pain follow-up  [id number]  Lucas Young is a 64 y.o. male coming in with complaint of back pain. Patient states he is doing well today. Pain up and down. OMT.  Patient states sitting at a computer for long amount of time seems to make patient has been working a little bit with cervical traction that has been beneficial.  Does feel some difference but still does not feel like he is completely able to come off of it at this time.     Past Medical History:  Diagnosis Date  . Allergy    Past Surgical History:  Procedure Laterality Date  . APPENDECTOMY     Social History   Socioeconomic History  . Marital status: Married    Spouse name: Not on file  . Number of children: Not on file  . Years of education: Not on file  . Highest education level: Not on file  Occupational History  . Not on file  Tobacco Use  . Smoking status: Never Smoker  . Smokeless tobacco: Never Used  Substance and Sexual Activity  . Alcohol use: No    Alcohol/week: 0.0 standard drinks  . Drug use: No  . Sexual activity: Not on file  Other Topics Concern  . Not on file  Social History Narrative  . Not on file   Social Determinants of Health   Financial Resource Strain:   . Difficulty of Paying Living Expenses: Not on file  Food Insecurity:   . Worried About Charity fundraiser in the Last Year: Not on file  . Ran Out of Food in the  Last Year: Not on file  Transportation Needs:   . Lack of Transportation (Medical): Not on file  . Lack of Transportation (Non-Medical): Not on file  Physical Activity:   . Days of Exercise per Week: Not on file  . Minutes of Exercise per Session: Not on file  Stress:   . Feeling of Stress : Not on file  Social Connections:   . Frequency of Communication with Friends and Family: Not on file  . Frequency of Social Gatherings with Friends and Family: Not on file  . Attends Religious Services: Not on file  . Active Member of Clubs or Organizations: Not on file  . Attends Archivist Meetings: Not on file  . Marital Status: Not on file   No Known Allergies Family History  Problem Relation Age of Onset  . Hypertension Mother   . Hypertension Father   . Hyperlipidemia Father   . Hypertension Maternal Grandmother   . Hypertension Maternal Grandfather       Current Outpatient Medications (Respiratory):  .  albuterol (PROVENTIL HFA;VENTOLIN HFA) 108 (90 Base) MCG/ACT inhaler, Inhale 2 puffs into the lungs every 4 (four) hours as needed for wheezing or shortness of breath. .  Azelastine-Fluticasone (DYMISTA NA), Place into the nose. Marland Kitchen  desloratadine (CLARINEX) 5 MG tablet, Take 5 mg by mouth daily. Marland Kitchen  montelukast (SINGULAIR) 10 MG tablet, Take 10 mg by mouth at bedtime.    Current Outpatient Medications (Other):  .  hydrOXYzine (ATARAX/VISTARIL) 10 MG tablet, Take 1 tablet (10 mg total) by mouth 3 (three) times daily as needed. Marland Kitchen  tiZANidine (ZANAFLEX) 4 MG capsule, 1 tablet at night .  traZODone (DESYREL) 50 MG tablet, TAKE 1 TABLET BY MOUTH AT BEDTIME IF NEEDED FOR SLEEP. Marland Kitchen  venlafaxine XR (EFFEXOR XR) 37.5 MG 24 hr capsule, Take 1 capsule (37.5 mg total) by mouth daily with breakfast. .  venlafaxine XR (EFFEXOR XR) 37.5 MG 24 hr capsule, Take 1 capsule (37.5 mg total) by mouth daily with breakfast.   Reviewed prior external information including notes and imaging from    primary care provider As well as notes that were available from care everywhere and other healthcare systems.  Past medical history, social, surgical and family history all reviewed in electronic medical record.  No pertanent information unless stated regarding to the chief complaint.   Review of Systems:  No headache, visual changes, nausea, vomiting, diarrhea, constipation, dizziness, abdominal pain, skin rash, fevers, chills, night sweats, weight loss, swollen lymph nodes, body aches, joint swelling, chest pain, shortness of breath, mood changes. POSITIVE muscle aches  Objective  Blood pressure (!) 150/90, pulse 74, height 5\' 7"  (1.702 m), weight 223 lb (101.2 kg), SpO2 94 %.   General: No apparent distress alert and oriented x3 mood and affect normal, dressed appropriately.  HEENT: Pupils equal, extraocular movements intact  Respiratory: Patient's speak in full sentences and does not appear short of breath  Cardiovascular: No lower extremity edema, non tender, no erythema  Skin: Warm dry intact with no signs of infection or rash on extremities or on axial skeleton.  Abdomen: Soft nontender  Neuro: Cranial nerves II through XII are intact, neurovascularly intact in all extremities with 2+ DTRs and 2+ pulses.  Lymph: No lymphadenopathy of posterior or anterior cervical chain or axillae bilaterally.  Gait normal with good balance and coordination.  MSK:  Non tender with full range of motion and good stability and symmetric strength and tone of shoulders, elbows, wrist, hip, knee and ankles bilaterally.  Low back exam does have some mild loss of lordosis, still working on core strength.  Hip abductor strength 4 out of 5.  Mild positive Corky Sox right greater than left.  Negative straight leg test.  No spinous process tenderness.  5 out of 5 strength that is symmetric to the lower extremities  Osteopathic findings C2 flexed rotated and side bent right C6 flexed rotated and side bent left T3  extended rotated and side bent right inhaled third rib T5 extended rotated and side bent left L4 flexed rotated and side bent left Sacrum right on right    Impression and Recommendations:     This case required medical decision making of moderate complexity. The above documentation has been reviewed and is accurate and complete Lucas Pulley, DO       Note: This dictation was prepared with Dragon dictation along with smaller phrase technology. Any transcriptional errors that result from this process are unintentional.

## 2020-01-08 NOTE — Assessment & Plan Note (Signed)
Decision today to treat with OMT was based on Physical Exam  After verbal consent patient was treated with HVLA, ME, FPR techniques in cervical, thoracic, rib,  lumbar and sacral areas  Patient tolerated the procedure well with improvement in symptoms  Patient given exercises, stretches and lifestyle modifications  See medications in patient instructions if given  Patient will follow up in 4-8 weeks 

## 2020-01-27 ENCOUNTER — Telehealth: Payer: Self-pay | Admitting: Family Medicine

## 2020-01-27 NOTE — Telephone Encounter (Signed)
Patient worked into schedule later this week.

## 2020-01-27 NOTE — Telephone Encounter (Signed)
Patient called stating that he seems to have pulled what he thinks is a groin muscle. It is causing a lot of pain in his back and making it hard to walk. Would Dr Tamala Julian be able to see him this week? I offered Dr Georgina Snell but he would prefer to see Dr Tamala Julian.

## 2020-01-29 ENCOUNTER — Encounter: Payer: Self-pay | Admitting: Family Medicine

## 2020-01-29 ENCOUNTER — Telehealth: Payer: Self-pay | Admitting: Family Medicine

## 2020-01-29 ENCOUNTER — Other Ambulatory Visit: Payer: Self-pay

## 2020-01-29 ENCOUNTER — Ambulatory Visit: Payer: BC Managed Care – PPO | Admitting: Family Medicine

## 2020-01-29 VITALS — BP 140/90 | HR 73 | Ht 67.0 in | Wt 222.0 lb

## 2020-01-29 DIAGNOSIS — S76211A Strain of adductor muscle, fascia and tendon of right thigh, initial encounter: Secondary | ICD-10-CM | POA: Insufficient documentation

## 2020-01-29 DIAGNOSIS — R1031 Right lower quadrant pain: Secondary | ICD-10-CM | POA: Diagnosis not present

## 2020-01-29 DIAGNOSIS — M999 Biomechanical lesion, unspecified: Secondary | ICD-10-CM

## 2020-01-29 NOTE — Assessment & Plan Note (Signed)
Mild overall.  Discussed which activities to do with avoid.  Compression discussed mild icing.  Patient should do well with conservative therapy.  Follow-up again in 4 to 6 weeks

## 2020-01-29 NOTE — Progress Notes (Signed)
[facility] [address] [place] Phone: [phone] Subjective:   I Kandace Blitz am serving as a Education administrator for Dr. Hulan Saas.  This visit occurred during the SARS-CoV-2 public health emergency.  Safety protocols were in place, including screening questions prior to the visit, additional usage of staff PPE, and extensive cleaning of exam room while observing appropriate contact time as indicated for disinfecting solutions.   I'm seeing this patient by the request  of:  Patient, No Pcp Per  CC: New right-sided groin pain  [id number]   01/07/2019 Low back pain with loss of lordosis.  Patient does sound for 1 appears to be more of a sacroiliac dysfunction as well and poor core strength.  This is been a chronic problem with mild exacerbation.  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  Patient is to continue with home exercises and icing regimen.  Discussed posture and ergonomics, patient is taking Effexor and we discussed medical management and medications.  Follow-up again in 4 to 8 weeks  Update 01/29/2020 Esli Lanman is a 64 y.o. male coming in with complaint of back pain. Last seen for OMT. Patient has had an increase in his pain. Right sided adductor pain. Lower back is most painful. States it felt like a pull more than a spasm but does not remember doing anything. States he has had a lot of stiffness as well.  Rates the severity of pain is 5 out of 10.  Has attempted to decrease activity.      Past Medical History:  Diagnosis Date  . Allergy    Past Surgical History:  Procedure Laterality Date  . APPENDECTOMY     Social History   Socioeconomic History  . Marital status: Married    Spouse name: Not on file  . Number of children: Not on file  . Years of education: Not on file  . Highest education level: Not on file    Occupational History  . Not on file  Tobacco Use  . Smoking status: Never Smoker  . Smokeless tobacco: Never Used  Substance and Sexual Activity  . Alcohol use: No    Alcohol/week: 0.0 standard drinks  . Drug use: No  . Sexual activity: Not on file  Other Topics Concern  . Not on file  Social History Narrative  . Not on file   Social Determinants of Health   Financial Resource Strain:   . Difficulty of Paying Living Expenses: Not on file  Food Insecurity:   . Worried About Charity fundraiser in the Last Year: Not on file  . Ran Out of Food in the Last Year: Not on file  Transportation Needs:   . Lack of Transportation (Medical): Not on file  . Lack of Transportation (Non-Medical): Not on file  Physical Activity:   . Days of Exercise per Week: Not on file  . Minutes of Exercise per Session: Not on file  Stress:   . Feeling of Stress : Not on file  Social Connections:   . Frequency of Communication with Friends and Family: Not on file  . Frequency of Social Gatherings with Friends and Family: Not on file  . Attends Religious Services: Not on file  . Active Member of Clubs or Organizations: Not on file  . Attends Archivist Meetings: Not on file  . Marital Status: Not  on file   No Known Allergies Family History  Problem Relation Age of Onset  . Hypertension Mother   . Hypertension Father   . Hyperlipidemia Father   . Hypertension Maternal Grandmother   . Hypertension Maternal Grandfather       Current Outpatient Medications (Respiratory):  .  albuterol (PROVENTIL HFA;VENTOLIN HFA) 108 (90 Base) MCG/ACT inhaler, Inhale 2 puffs into the lungs every 4 (four) hours as needed for wheezing or shortness of breath. .  Azelastine-Fluticasone (DYMISTA NA), Place into the nose. Marland Kitchen  desloratadine (CLARINEX) 5 MG tablet, Take 5 mg by mouth daily. .  montelukast (SINGULAIR) 10 MG tablet, Take 10 mg by mouth at bedtime.    Current Outpatient Medications (Other):   .  hydrOXYzine (ATARAX/VISTARIL) 10 MG tablet, Take 1 tablet (10 mg total) by mouth 3 (three) times daily as needed. Marland Kitchen  tiZANidine (ZANAFLEX) 4 MG capsule, 1 tablet at night .  traZODone (DESYREL) 50 MG tablet, TAKE 1 TABLET BY MOUTH AT BEDTIME IF NEEDED FOR SLEEP. Marland Kitchen  venlafaxine XR (EFFEXOR XR) 37.5 MG 24 hr capsule, Take 1 capsule (37.5 mg total) by mouth daily with breakfast. .  venlafaxine XR (EFFEXOR XR) 37.5 MG 24 hr capsule, Take 1 capsule (37.5 mg total) by mouth daily with breakfast.   Reviewed prior external information including notes and imaging from  primary care provider As well as notes that were available from care everywhere and other healthcare systems.  Past medical history, social, surgical and family history all reviewed in electronic medical record.  No pertanent information unless stated regarding to the chief complaint.   Review of Systems:  No headache, visual changes, nausea, vomiting, diarrhea, constipation, dizziness, abdominal pain, skin rash, fevers, chills, night sweats, weight loss, swollen lymph nodes, body aches, joint swelling, chest pain, shortness of breath, mood changes. POSITIVE muscle aches  Objective  Blood pressure 140/90, pulse 73, height 5\' 7"  (1.702 m), weight 222 lb (100.7 kg), SpO2 96 %.   General: No apparent distress alert and oriented x3 mood and affect normal, dressed appropriately.  HEENT: Pupils equal, extraocular movements intact  Respiratory: Patient's speak in full sentences and does not appear short of breath  Cardiovascular: No lower extremity edema, non tender, no erythema  Skin: Warm dry intact with no signs of infection or rash on extremities or on axial skeleton.  Abdomen: Soft nontender  Neuro: Cranial nerves II through XII are intact, neurovascularly intact in all extremities with 2+ DTRs and 2+ pulses.  Lymph: No lymphadenopathy of posterior or anterior cervical chain or axillae bilaterally.  Gait mild antalgic MSK:  Patient's low back does have poor core strength noted, mild loss of lordosis.  Patient does have increasing tightness in the paraspinal musculature right greater than left.  Unable to do [place] secondary to pain.  Increased discomfort with internal and adduction of the on the right only..    Osteopathic findings  C6 flexed rotated and side bent left T3 extended rotated and side bent right inhaled third rib T9 extended rotated and side bent left L1 flexed rotated and side bent right  L5 flexed rotated and side bent left Sacrum right on right    Impression and Recommendations:     This case required medical decision making of moderate complexity. The above documentation has been reviewed and is accurate and complete [name], DO       Note: This dictation was prepared with Dragon dictation along with smaller phrase technology. Any transcriptional errors that  result from this process are unintentional.

## 2020-01-29 NOTE — Patient Instructions (Addendum)
Good to see you Lucas Young Thigh compression sleeve daily and with a lot of walking for 2 weeks Cool towel not ice See me again in 3-4 weeks

## 2020-01-29 NOTE — Telephone Encounter (Signed)
Patient notified to disregard referral.

## 2020-01-29 NOTE — Assessment & Plan Note (Signed)
Decision today to treat with OMT was based on Physical Exam  After verbal consent patient was treated with HVLA, ME, FPR techniques in cervical, thoracic, rib,  lumbar and sacral areas  Patient tolerated the procedure well with improvement in symptoms  Patient given exercises, stretches and lifestyle modifications  See medications in patient instructions if given  Patient will follow up in 4-8 weeks 

## 2020-01-29 NOTE — Telephone Encounter (Signed)
Pt seen today and expressed surprise at the referral to [facility] ( he must have received a call from them ). Pt said this was never mentioned during the visit today and he thinks this is an error. Please call to clarify

## 2020-02-07 ENCOUNTER — Ambulatory Visit: Payer: BC Managed Care – PPO | Attending: Internal Medicine

## 2020-02-07 DIAGNOSIS — Z23 Encounter for immunization: Secondary | ICD-10-CM

## 2020-02-07 NOTE — Progress Notes (Signed)
   Covid-19 Vaccination Clinic  Name:  Elim Piel    MRN: [id number] DOB: [birth year]  02/07/2020  Mr. Meraz was observed post Covid-19 immunization for 15 minutes without incident. He was provided with Vaccine Information Sheet and instruction to access the V-Safe system.   Mr. Mccaughey was instructed to call 911 with any severe reactions post vaccine: Marland Kitchen Difficulty breathing  . Swelling of face and throat  . A fast heartbeat  . A bad rash all over body  . Dizziness and weakness   Immunizations Administered    Name Date Dose VIS Date Route   Pfizer COVID-19 Vaccine 02/07/2020  8:12 AM 0.3 mL 11/08/2019 Intramuscular   Manufacturer: [place]   Lot: [id number]   [place]: [id number]

## 2020-02-13 ENCOUNTER — Ambulatory Visit: Payer: BC Managed Care – PPO | Admitting: Family Medicine

## 2020-02-19 ENCOUNTER — Ambulatory Visit: Payer: BC Managed Care – PPO | Admitting: Family Medicine

## 2020-02-27 ENCOUNTER — Ambulatory Visit: Payer: BC Managed Care – PPO | Admitting: Family Medicine

## 2020-02-27 ENCOUNTER — Encounter: Payer: Self-pay | Admitting: Family Medicine

## 2020-02-27 ENCOUNTER — Other Ambulatory Visit: Payer: Self-pay

## 2020-02-27 VITALS — BP 130/80 | HR 73 | Ht 67.0 in | Wt 222.0 lb

## 2020-02-27 DIAGNOSIS — M999 Biomechanical lesion, unspecified: Secondary | ICD-10-CM

## 2020-02-27 DIAGNOSIS — M533 Sacrococcygeal disorders, not elsewhere classified: Secondary | ICD-10-CM

## 2020-02-27 NOTE — Assessment & Plan Note (Signed)

## 2020-02-27 NOTE — Progress Notes (Signed)
[facility] [address] [place] Phone: [phone] Subjective:   I Kandace Blitz am serving as a Education administrator for Dr. Hulan Saas.  This visit occurred during the SARS-CoV-2 public health emergency.  Safety protocols were in place, including screening questions prior to the visit, additional usage of staff PPE, and extensive cleaning of exam room while observing appropriate contact time as indicated for disinfecting solutions.   I'm seeing this patient by the request  of:  Patient, No Pcp Per  CC: Low back pain and R hip/groin  [id number]   01/29/20: R groin: Mild overall.  Discussed which activities to do with avoid.  Compression discussed mild icing.  Patient should do well with conservative therapy.  Follow-up again in 4 to 6 weeks  02/27/20: Gates Demoulin is a 64 y.o. male coming in with complaint of groin pain. Last seen 01/29/2020 for OMT for his back and for new c/o R adductor/groin pain.  He was prescribed Effexor and provided a HEP and advised to get a thigh compression sleeve.  Since his last visit, pt reports that he is doing well. Mid back pain. Neck is stiff as well.       Past Medical History:  Diagnosis Date  . Allergy    Past Surgical History:  Procedure Laterality Date  . APPENDECTOMY     Social History   Socioeconomic History  . Marital status: Married    Spouse name: Not on file  . Number of children: Not on file  . Years of education: Not on file  . Highest education level: Not on file  Occupational History  . Not on file  Tobacco Use  . Smoking status: Never Smoker  . Smokeless tobacco: Never Used  Substance and Sexual Activity  . Alcohol use: No    Alcohol/week: 0.0 standard drinks  . Drug use: No  . Sexual activity: Not on file  Other Topics Concern  . Not on file  Social History Narrative  . Not on file   Social Determinants of Health   Financial Resource Strain:   . Difficulty of  Paying Living Expenses:   Food Insecurity:   . Worried About Charity fundraiser in the Last Year:   . Arboriculturist in the Last Year:   Transportation Needs:   . Film/video editor (Medical):   Marland Kitchen Lack of Transportation (Non-Medical):   Physical Activity:   . Days of Exercise per Week:   . Minutes of Exercise per Session:   Stress:   . Feeling of Stress :   Social Connections:   . Frequency of Communication with Friends and Family:   . Frequency of Social Gatherings with Friends and Family:   . Attends Religious Services:   . Active Member of Clubs or Organizations:   . Attends Archivist Meetings:   Marland Kitchen Marital Status:    No Known Allergies Family History  Problem Relation Age of Onset  . Hypertension Mother   . Hypertension Father   . Hyperlipidemia Father   . Hypertension Maternal Grandmother   . Hypertension Maternal Grandfather       Current Outpatient Medications (Respiratory):  .  albuterol (PROVENTIL HFA;VENTOLIN HFA) 108 (90 Base) MCG/ACT inhaler, Inhale 2 puffs into the lungs every 4 (four) hours as needed for wheezing or shortness of breath. .  Azelastine-Fluticasone (DYMISTA NA), Place into the nose. Marland Kitchen  desloratadine (CLARINEX) 5 MG tablet, Take 5 mg by mouth  daily. .  montelukast (SINGULAIR) 10 MG tablet, Take 10 mg by mouth at bedtime.    Current Outpatient Medications (Other):  .  hydrOXYzine (ATARAX/VISTARIL) 10 MG tablet, Take 1 tablet (10 mg total) by mouth 3 (three) times daily as needed. Marland Kitchen  tiZANidine (ZANAFLEX) 4 MG capsule, 1 tablet at night .  traZODone (DESYREL) 50 MG tablet, TAKE 1 TABLET BY MOUTH AT BEDTIME IF NEEDED FOR SLEEP. Marland Kitchen  venlafaxine XR (EFFEXOR XR) 37.5 MG 24 hr capsule, Take 1 capsule (37.5 mg total) by mouth daily with breakfast.   Reviewed prior external information including notes and imaging from  primary care provider As well as notes that were available from care everywhere and other healthcare systems.  Past  medical history, social, surgical and family history all reviewed in electronic medical record.  No pertanent information unless stated regarding to the chief complaint.   Review of Systems:  No headache, visual changes, nausea, vomiting, diarrhea, constipation, dizziness, abdominal pain, skin rash, fevers, chills, night sweats, weight loss, swollen lymph nodes, body aches, joint swelling, chest pain, shortness of breath, mood changes. POSITIVE muscle aches  Objective  Blood pressure 130/80, pulse 73, height 5\' 7"  (1.702 m), weight 222 lb (100.7 kg), SpO2 96 %.   General: No apparent distress alert and oriented x3 mood and affect normal, dressed appropriately.  HEENT: Pupils equal, extraocular movements intact  Respiratory: Patient's speak in full sentences and does not appear short of breath  Cardiovascular: No lower extremity edema, non tender, no erythema  Neuro: Cranial nerves II through XII are intact, neurovascularly intact in all extremities with 2+ DTRs and 2+ pulses.  Gait normal with good balance and coordination.  MSK:  Non tender with full range of motion and good stability and symmetric strength and tone of shoulders, elbows, wrist, hip, knee and ankles bilaterally.  Back exam shows the patient does have some loss of lordosis.  Poor core strength.  Tender to palpation over the sacroiliac joints on the left greater than right.  Patient is having no pain in the groin area at this time.  Normal pain in the neck but does have some mild tightness.  Lacks last 5 degrees of sidebending bilaterally.  Osteopathic findings  C2 flexed rotated and side bent right C4 flexed rotated and side bent left C6 flexed rotated and side bent left T3 extended rotated and side bent right inhaled third rib T9 extended rotated and side bent left L2 flexed rotated and side bent right Sacrum right on right    Impression and Recommendations:     This case required medical decision making of moderate  complexity. The above documentation has been reviewed and is accurate and complete [name], DO       Note: This dictation was prepared with Dragon dictation along with smaller phrase technology. Any transcriptional errors that result from this process are unintentional.

## 2020-02-27 NOTE — Assessment & Plan Note (Signed)
Continued difficulty.  Chronic problem with mild exacerbation.  Responded fairly well to osteopathic manipulation.  Discussed medication management including the Effexor, trazodone, and muscle relaxer as needed.  Follow-up again in 4 to 8 weeks.

## 2020-02-27 NOTE — Patient Instructions (Addendum)
Good to see you See me again 6-7 weeks

## 2020-03-03 ENCOUNTER — Ambulatory Visit: Payer: BC Managed Care – PPO | Attending: Internal Medicine

## 2020-03-03 DIAGNOSIS — Z23 Encounter for immunization: Secondary | ICD-10-CM

## 2020-03-03 NOTE — Progress Notes (Signed)
   Covid-19 Vaccination Clinic  Name:  Lucas Young    MRN: [id number] DOB: 1956-09-10  03/03/2020  Lucas Young was observed post Covid-19 immunization for 15 minutes without incident. He was provided with Vaccine Information Sheet and instruction to access the V-Safe system.   Lucas Young was instructed to call 911 with any severe reactions post vaccine: Marland Kitchen Difficulty breathing  . Swelling of face and throat  . A fast heartbeat  . A bad rash all over body  . Dizziness and weakness   Immunizations Administered    Name Date Dose VIS Date Route   Pfizer COVID-19 Vaccine 03/03/2020  8:34 AM 0.3 mL 11/08/2019 Intramuscular   Manufacturer: [employer]   Lot: [id number]   [place]: [id number]

## 2020-03-13 ENCOUNTER — Ambulatory Visit: Payer: BC Managed Care – PPO | Admitting: Family Medicine

## 2020-04-16 ENCOUNTER — Ambulatory Visit: Payer: BC Managed Care – PPO | Admitting: Family Medicine

## 2020-04-24 ENCOUNTER — Other Ambulatory Visit: Payer: Self-pay

## 2020-04-24 ENCOUNTER — Encounter: Payer: Self-pay | Admitting: Family Medicine

## 2020-04-24 ENCOUNTER — Ambulatory Visit: Payer: BC Managed Care – PPO | Admitting: Family Medicine

## 2020-04-24 VITALS — BP 110/70 | HR 82 | Ht 67.0 in | Wt 229.0 lb

## 2020-04-24 DIAGNOSIS — M533 Sacrococcygeal disorders, not elsewhere classified: Secondary | ICD-10-CM

## 2020-04-24 DIAGNOSIS — M999 Biomechanical lesion, unspecified: Secondary | ICD-10-CM

## 2020-04-24 MED ORDER — DOXYCYCLINE HYCLATE 100 MG PO TABS: 100.0000 mg | ORAL_TABLET | Freq: Two times a day (BID) | ORAL | 0 refills | Status: DC

## 2020-04-24 NOTE — Assessment & Plan Note (Signed)

## 2020-04-24 NOTE — Patient Instructions (Addendum)
Doxy 2 times a day for 14 days  See me again in 6-7 weeks

## 2020-04-24 NOTE — Progress Notes (Signed)
[facility] [place] Phone: [phone] Subjective:   Lucas [name], am serving as a scribe for Dr. Hulan Saas. This visit occurred during the SARS-CoV-2 public health emergency.  Safety protocols were in place, including screening questions prior to the visit, additional usage of staff PPE, and extensive cleaning of exam room while observing appropriate contact time as indicated for disinfecting solutions.   I'm seeing this patient by the request  of:  Patient, [name] Pcp Per  CC: Back pain follow-up  [id number]  Lucas [name] is a 64 y.o. male coming in with complaint of back pain. Last seen on 02/27/2020 for OMT. Patient states that his left lower back is tight and painful. [name] change since last visit.   Patient states did have some stress.  Did have a death in the family.  Had travel and traveling increased anxiety and discomfort.  Continues on the medications at this moment with [name] significant changes or side effects.      Past Medical History:  Diagnosis Date  . Allergy    Past Surgical History:  Procedure Laterality Date  . APPENDECTOMY     Social History   Socioeconomic History  . Marital status: Married    Spouse name: Not on file  . Number of children: Not on file  . Years of education: Not on file  . Highest education level: Not on file  Occupational History  . Not on file  Tobacco Use  . Smoking status: Never Smoker  . Smokeless tobacco: Never Used  Substance and Sexual Activity  . Alcohol use: [name]    Alcohol/week: 0.0 standard drinks  . Drug use: [name]  . Sexual activity: Not on file  Other Topics Concern  . Not on file  Social History Narrative  . Not on file   Social Determinants of Health   Financial Resource Strain:   . Difficulty of Paying Living Expenses:   Food Insecurity:   . Worried About Charity fundraiser in the Last Year:   . Arboriculturist in the Last Year:     Transportation Needs:   . Film/video editor (Medical):   Marland Kitchen Lack of Transportation (Non-Medical):   Physical Activity:   . Days of Exercise per Week:   . Minutes of Exercise per Session:   Stress:   . Feeling of Stress :   Social Connections:   . Frequency of Communication with Friends and Family:   . Frequency of Social Gatherings with Friends and Family:   . Attends Religious Services:   . Active Member of Clubs or Organizations:   . Attends Archivist Meetings:   Marland Kitchen Marital Status:    [name] Known Allergies Family History  Problem Relation Age of Onset  . Hypertension Mother   . Hypertension Father   . Hyperlipidemia Father   . Hypertension Maternal Grandmother   . Hypertension Maternal Grandfather       Current Outpatient Medications (Respiratory):  .  albuterol (PROVENTIL HFA;VENTOLIN HFA) 108 (90 Base) MCG/ACT inhaler, Inhale 2 puffs into the lungs every 4 (four) hours as needed for wheezing or shortness of breath. .  Azelastine-Fluticasone (DYMISTA NA), Place into the nose. Marland Kitchen  desloratadine (CLARINEX) 5 MG tablet, Take 5 mg by mouth daily. .  montelukast (SINGULAIR) 10 MG tablet, Take 10 mg by mouth at bedtime.    Current Outpatient Medications (Other):  .  doxycycline (VIBRA-TABS) 100 MG tablet, Take  1 tablet (100 mg total) by mouth 2 (two) times daily. .  hydrOXYzine (ATARAX/VISTARIL) 10 MG tablet, Take 1 tablet (10 mg total) by mouth 3 (three) times daily as needed. Marland Kitchen  tiZANidine (ZANAFLEX) 4 MG capsule, 1 tablet at night .  traZODone (DESYREL) 50 MG tablet, TAKE 1 TABLET BY MOUTH AT BEDTIME IF NEEDED FOR SLEEP. Marland Kitchen  venlafaxine XR (EFFEXOR XR) 37.5 MG 24 hr capsule, Take 1 capsule (37.5 mg total) by mouth daily with breakfast.   Reviewed prior external information including notes and imaging from  primary care provider As well as notes that were available from care everywhere and other healthcare systems.  Past medical history, social, surgical and  family history all reviewed in electronic medical record.  [name] pertanent information unless stated regarding to the chief complaint.   Review of Systems:  [name] headache, visual changes, nausea, vomiting, diarrhea, constipation, dizziness, abdominal pain, skin rash, fevers, chills, night sweats, weight loss, swollen lymph nodes, body aches, joint swelling, chest pain, shortness of breath, mood changes. POSITIVE muscle aches  Objective  Blood pressure 110/70, pulse 82, height 5\' 7"  (1.702 m), weight 229 lb (103.9 kg), SpO2 97 %.   General: [name] apparent distress alert and oriented x3 mood and affect normal, dressed appropriately.  HEENT: Pupils equal, extraocular movements intact  Respiratory: Patient's speak in full sentences and does not appear short of breath  Cardiovascular: [name] lower extremity edema, non tender, [name] erythema  Neuro: Cranial nerves II through XII are intact, neurovascularly intact in all extremities with 2+ DTRs and 2+ pulses.  Gait normal with good balance and coordination.  MSK:  Non tender with full range of motion and good stability and symmetric strength and tone of shoulders, elbows, wrist, hip, knee and ankles bilaterally.  Low back pain shows poor core strength.  Tender to palpation of paraspinal musculature lumbar spine.  Tightness with FABER test right greater than left.  Osteopathic findings C4 flexed rotated and side bent left C6 flexed rotated and side bent left T3 extended rotated and side bent right inhaled third rib T6 extended rotated and side bent left L2 flexed rotated and side bent right Sacrum right on right    Impression and Recommendations:     This case required medical decision making of moderate complexity. The above documentation has been reviewed and is accurate and complete [name], DO       Note: This dictation was prepared with Dragon dictation along with smaller phrase technology. Any transcriptional errors that result from this  process are unintentional.

## 2020-04-24 NOTE — Assessment & Plan Note (Signed)
Discussed with patient about postural anomalies, patient encouraged to continue on the Effexor.  Seems to be doing well.  Has Zanaflex for breakthrough pain.  Chronic problem with exacerbation.  Follow-up again in 4 to 6 weeks.

## 2020-05-13 ENCOUNTER — Encounter: Payer: Self-pay | Admitting: Family Medicine

## 2020-06-02 ENCOUNTER — Other Ambulatory Visit: Payer: Self-pay

## 2020-06-02 MED ORDER — VENLAFAXINE HCL ER 37.5 MG PO CP24: 37.5000 mg | ORAL_CAPSULE | Freq: Every day | ORAL | 0 refills | Status: DC

## 2020-06-08 ENCOUNTER — Encounter: Payer: Self-pay | Admitting: Family Medicine

## 2020-06-08 ENCOUNTER — Other Ambulatory Visit: Payer: Self-pay

## 2020-06-08 ENCOUNTER — Ambulatory Visit: Payer: BC Managed Care – PPO | Admitting: Family Medicine

## 2020-06-08 VITALS — BP 144/90 | HR 65 | Ht 67.0 in | Wt 224.0 lb

## 2020-06-08 DIAGNOSIS — M545 Low back pain, unspecified: Secondary | ICD-10-CM

## 2020-06-08 DIAGNOSIS — M999 Biomechanical lesion, unspecified: Secondary | ICD-10-CM | POA: Diagnosis not present

## 2020-06-08 NOTE — Progress Notes (Signed)
[place] [address] [place] Phone: [phone] Subjective:   I Kandace Blitz am serving as a Education administrator for Dr. Hulan Saas.  This visit occurred during the SARS-CoV-2 public health emergency.  Safety protocols were in place, including screening questions prior to the visit, additional usage of staff PPE, and extensive cleaning of exam room while observing appropriate contact time as indicated for disinfecting solutions.   I'm seeing this patient by the request  of:  Patient, No Pcp Per  CC: Low back pain follow-up  [id number]  Coreyon Nicotra is a 64 y.o. male coming in with complaint of back and neck pain. OMT 04/24/2020. Patient states he is doing well today. Stiff neck and mid back pain. States he is better than usual.   Medications patient has been prescribed: Attempted to get off the Effexor but had difficulty with anger and anxiety.  Restarted it and is feeling much better again.          Reviewed prior external information including notes and imaging from previsou exam, outside providers and external EMR if available.   As well as notes that were available from care everywhere and other healthcare systems.  Past medical history, social, surgical and family history all reviewed in electronic medical record.  No pertanent information unless stated regarding to the chief complaint.   Past Medical History:  Diagnosis Date  . Allergy     No Known Allergies   Review of Systems:  No headache, visual changes, nausea, vomiting, diarrhea, constipation, dizziness, abdominal pain, skin rash, fevers, chills, night sweats, weight loss, swollen lymph nodes, body aches, joint swelling, chest pain, shortness of breath, mood changes. POSITIVE muscle aches  Objective  Blood pressure (!) 144/90, pulse 65, height 5\' 7"  (1.702 m), weight 224 lb (101.6 kg), SpO2 97 %.   General: No apparent distress alert and oriented x3  mood and affect normal, dressed appropriately.  HEENT: Pupils equal, extraocular movements intact  Respiratory: Patient's speak in full sentences and does not appear short of breath  Cardiovascular: No lower extremity edema, non tender, no erythema  Neuro: Cranial nerves II through XII are intact, neurovascularly intact in all extremities with 2+ DTRs and 2+ pulses.  Gait normal with good balance and coordination.  MSK:  Non tender with full range of motion and good stability and symmetric strength and tone of shoulders, elbows, wrist, hip, knee and ankles bilaterally.  Back -low back exam still shows poor core strength.  Tender to palpation in the paraspinal musculature of the lumbar spine.  Mild tightness with FABER test.  Osteopathic findings  C2 flexed rotated and side bent right C7 flexed rotated and side bent left T3 extended rotated and side bent right inhaled rib T9 extended rotated and side bent left L2 flexed rotated and side bent right Sacrum right on right       Assessment and Plan:  Lumbar back pain Continued difficulty.  Mild exacerbation.  Patient has had some mild increase in anxiety.  Has been traveling recently as well and is going to be traveling again.  Discussed posture and ergonomics.  Increase activity slowly. We discussed the Effexor again as well as the Zanaflex.   Nonallopathic problems  Decision today to treat with OMT was based on Physical Exam  After verbal consent patient was treated with HVLA, ME, FPR techniques in cervical, rib, thoracic, lumbar, and sacral  areas  Patient tolerated the procedure well with improvement in symptoms  Patient given exercises, stretches and lifestyle modifications  See medications in patient instructions if given  Patient will follow up in 4-8 weeks      The above documentation has been reviewed and is accurate and complete [name], DO       Note: This dictation was prepared with Dragon dictation  along with smaller phrase technology. Any transcriptional errors that result from this process are unintentional.

## 2020-06-08 NOTE — Patient Instructions (Addendum)
Good to see you See me again in 6-7 weeks 

## 2020-06-08 NOTE — Assessment & Plan Note (Signed)
Continued difficulty.  Mild exacerbation.  Patient has had some mild increase in anxiety.  Has been traveling recently as well and is going to be traveling again.  Discussed posture and ergonomics.  Increase activity slowly.

## 2020-07-08 ENCOUNTER — Other Ambulatory Visit: Payer: Self-pay

## 2020-07-08 ENCOUNTER — Encounter: Payer: Self-pay | Admitting: Family Medicine

## 2020-07-08 MED ORDER — VENLAFAXINE HCL ER 37.5 MG PO CP24: 37.5000 mg | ORAL_CAPSULE | Freq: Every day | ORAL | 0 refills | Status: DC

## 2020-07-20 ENCOUNTER — Encounter: Payer: Self-pay | Admitting: Family Medicine

## 2020-07-20 ENCOUNTER — Other Ambulatory Visit: Payer: Self-pay

## 2020-07-20 ENCOUNTER — Ambulatory Visit: Payer: BC Managed Care – PPO | Admitting: Family Medicine

## 2020-07-20 VITALS — BP 118/86 | HR 76 | Ht 67.0 in | Wt 227.0 lb

## 2020-07-20 DIAGNOSIS — M533 Sacrococcygeal disorders, not elsewhere classified: Secondary | ICD-10-CM

## 2020-07-20 DIAGNOSIS — M545 Low back pain, unspecified: Secondary | ICD-10-CM

## 2020-07-20 DIAGNOSIS — M999 Biomechanical lesion, unspecified: Secondary | ICD-10-CM

## 2020-07-20 NOTE — Assessment & Plan Note (Signed)
Chronic problem with mild exacerbation.  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  Encourage patient to watch the ergonomics.  He is going to increase activity slowly.  Patient has been going to the gym and we discussed proper lifting mechanics for at least the upper body that seem to give him more tenderness in the parascapular region.  Follow-up again in 4 to 8 weeks

## 2020-07-20 NOTE — Progress Notes (Signed)
Lucas [name] Phone: [phone] Subjective:   Lucas [name], am serving as a scribe for Dr. Hulan Saas. This visit occurred during the SARS-CoV-2 public health emergency.  Safety protocols were in place, including screening questions prior to the visit, additional usage of staff PPE, and extensive cleaning of exam room while observing appropriate contact time as indicated for disinfecting solutions.   I'm seeing this patient by the request  of:  Patient, [name] Pcp Per  CC: back pain follow-up  [id number]  Lucas [name] is a 64 y.o. male coming in with complaint of back and neck pain Patient states that his back is fine but believes that he may have hurt his shoulder at the gym. Pain throughout the joint and now the other shoulder is bothering him. Has been back to the gym. Pain after his workout not during.  Medications patient has been prescribed: Effexor  Taking: Yes         Reviewed prior external information including notes and imaging from previsou exam, outside providers and external EMR if available.   As well as notes that were available from care everywhere and other healthcare systems.  Past medical history, social, surgical and family history all reviewed in electronic medical record.  [name] pertanent information unless stated regarding to the chief complaint.   Past Medical History:  Diagnosis Date  . Allergy     [name] Known Allergies   Review of Systems:  [name] headache, visual changes, nausea, vomiting, diarrhea, constipation, dizziness, abdominal pain, skin rash, fevers, chills, night sweats, weight loss, swollen lymph nodes, body aches, joint swelling, chest pain, shortness of breath, mood changes. POSITIVE muscle aches  Objective  Blood pressure 118/86, pulse 76, height 5\' 7"  (1.702 m), weight 227 lb (103 kg), SpO2 97 %.   General: [name] apparent distress alert and oriented x3 mood  and affect normal, dressed appropriately.  HEENT: Pupils equal, extraocular movements intact  Respiratory: Patient's speak in full sentences and does not appear short of breath  Cardiovascular: [name] lower extremity edema, non tender, [name] erythema  Neuro: Cranial nerves II through XII are intact, neurovascularly intact in all extremities with 2+ DTRs and 2+ pulses.  Gait normal with good balance and coordination.  MSK:  Non tender with full range of motion and good stability and symmetric strength and tone of shoulders, elbows, wrist, hip, knee and ankles bilaterally.  Back -low back exam does have some mild loss of lordosis.  Some tenderness to palpation of the paraspinal musculature more in the parascapular region today.  Mild in the lumbar area.  Patient does have some tightness of the shoulders bilaterally but [name] true impingement noted.  Osteopathic findings  C2 flexed rotated and side bent right C7 flexed rotated and side bent left T3 extended rotated and side bent right inhaled rib T5 extended rotated and side bent left L2 flexed rotated and side bent right Sacrum right on right       Assessment and Plan:  Lumbar back pain Chronic problem with mild exacerbation.  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  Encourage patient to watch the ergonomics.  He is going to increase activity slowly.  Patient has been going to the gym and we discussed proper lifting mechanics for at least the upper body that seem to give him more tenderness in the parascapular region.  Follow-up again in 4 to 8 weeks    Nonallopathic problems  Decision today to treat with OMT was based on Physical Exam  After verbal consent patient was treated with HVLA, ME, FPR techniques in cervical, rib, thoracic, lumbar, and sacral  areas  Patient tolerated the procedure well with improvement in symptoms  Patient given exercises, stretches and lifestyle modifications  See medications in patient instructions  if given  Patient will follow up in 4-8 weeks      The above documentation has been reviewed and is accurate and complete [name], DO       Note: This dictation was prepared with Dragon dictation along with smaller phrase technology. Any transcriptional errors that result from this process are unintentional.

## 2020-07-20 NOTE — Patient Instructions (Addendum)
Focus on posture Yoga wheel can help Exercises Hands in peripheral vision See me again in 6-8 weeks

## 2020-09-02 ENCOUNTER — Other Ambulatory Visit: Payer: Self-pay

## 2020-09-02 ENCOUNTER — Encounter: Payer: Self-pay | Admitting: Family Medicine

## 2020-09-02 ENCOUNTER — Ambulatory Visit: Payer: BC Managed Care – PPO | Admitting: Family Medicine

## 2020-09-02 VITALS — BP 154/90 | HR 72 | Ht 67.0 in | Wt 218.0 lb

## 2020-09-02 DIAGNOSIS — M545 Low back pain, unspecified: Secondary | ICD-10-CM

## 2020-09-02 DIAGNOSIS — M999 Biomechanical lesion, unspecified: Secondary | ICD-10-CM

## 2020-09-02 NOTE — Patient Instructions (Addendum)
Good to see you You know the drill See me again in 5-6 weeks

## 2020-09-02 NOTE — Progress Notes (Signed)
[place] [address] [place] Phone: [phone] Subjective:   I Kandace Blitz am serving as a Education administrator for Dr. Hulan Saas.  This visit occurred during the SARS-CoV-2 public health emergency.  Safety protocols were in place, including screening questions prior to the visit, additional usage of staff PPE, and extensive cleaning of exam room while observing appropriate contact time as indicated for disinfecting solutions.   I'm seeing this patient by the request  of:  Patient, No Pcp Per  CC: Low back pain and neck pain follow-up  [id number]  Jatin Naumann is a 64 y.o. male coming in with complaint of back and neck pain Patient states he is doing well today. Some pain in his midback and stiffness in his neck.  Patient did do a lot of driving and hiking recently.  Feels like that did exacerbate some of the discomfort and pain.  Patient denies any radiation.  Just feels like more stiffness recently.  Medications patient has been prescribed: Hydroxyzine, trazodone, Effexor  Taking: Yes         Reviewed prior external information including notes and imaging from previsou exam, outside providers and external EMR if available.   As well as notes that were available from care everywhere and other healthcare systems.  Past medical history, social, surgical and family history all reviewed in electronic medical record.  No pertanent information unless stated regarding to the chief complaint.   Past Medical History:  Diagnosis Date   Allergy     No Known Allergies   Review of Systems:  No headache, visual changes, nausea, vomiting, diarrhea, constipation, dizziness, abdominal pain, skin rash, fevers, chills, night sweats, weight loss, swollen lymph nodes, body aches, joint swelling, chest pain, shortness of breath, mood changes. POSITIVE muscle aches  Objective  Blood pressure (!) 154/90, pulse 72, height 5\' 7"  (1.702  m), weight 218 lb (98.9 kg), SpO2 98 %.   General: No apparent distress alert and oriented x3 mood and affect normal, dressed appropriately.  HEENT: Pupils equal, extraocular movements intact  Respiratory: Patient's speak in full sentences and does not appear short of breath  Cardiovascular: No lower extremity edema, non tender, no erythema  Neuro: Cranial nerves II through XII are intact, neurovascularly intact in all extremities with 2+ DTRs and 2+ pulses.  Gait normal with good balance and coordination.  MSK:  Non tender with full range of motion and good stability and symmetric strength and tone of shoulders, elbows, wrist, hip, knee and ankles bilaterally.  Back -mild loss of lordosis.  Patient does have some increasing stiffness more in the thoracolumbar juncture right greater than left.  Neck exam has some tightness as well.  Seems to be more in the parascapular region out as well right greater than left.  Osteopathic findings   C5 flexed rotated and side bent left T4 extended rotated and side bent right inhaled rib T8 extended rotated and side bent left L2 flexed rotated and side bent right Sacrum right on right       Assessment and Plan:  Lumbar back pain Multifactorial.  Still has some weakness of the core strength.  Mild exacerbation of his underlying problem.  We discussed with the home exercise, trying to avoid being stationary for too long.  I do feel that the long trip likely did have some difficulty.  Follow-up again in 4 to 8 weeks    Nonallopathic problems  Decision today to treat with OMT was  based on Physical Exam  After verbal consent patient was treated with HVLA, ME, FPR techniques in cervical, rib, thoracic, lumbar, and sacral  areas  Patient tolerated the procedure well with improvement in symptoms  Patient given exercises, stretches and lifestyle modifications  See medications in patient instructions if given  Patient will follow up in 4-8  weeks      The above documentation has been reviewed and is accurate and complete [name], DO       Note: This dictation was prepared with Dragon dictation along with smaller phrase technology. Any transcriptional errors that result from this process are unintentional.

## 2020-09-02 NOTE — Assessment & Plan Note (Signed)
Multifactorial.  Still has some weakness of the core strength.  Mild exacerbation of his underlying problem.  We discussed with the home exercise, trying to avoid being stationary for too long.  I do feel that the long trip likely did have some difficulty.  Follow-up again in 4 to 8 weeks

## 2020-09-26 ENCOUNTER — Other Ambulatory Visit: Payer: Self-pay

## 2020-09-26 ENCOUNTER — Ambulatory Visit: Payer: BC Managed Care – PPO | Attending: Internal Medicine

## 2020-09-26 DIAGNOSIS — Z23 Encounter for immunization: Secondary | ICD-10-CM

## 2020-09-26 NOTE — Progress Notes (Signed)
   [facility] Vaccination [facility]  Name:  Lucas Young    MRN: [id number] DOB: September 04, 1956  09/26/2020  Lucas Young was observed post Covid-19 immunization for 15 minutes without incident. He was provided with Vaccine Information Sheet and instruction to access the V-Safe system.   Lucas Young was instructed to call 911 with any severe reactions post vaccine: Marland Kitchen Difficulty breathing  . Swelling of face and throat  . A fast heartbeat  . A bad rash all over body  . Dizziness and weakness

## 2020-10-05 NOTE — Progress Notes (Signed)
[facility] [address] [place] Phone: [phone] Subjective:   I Kandace Blitz am serving as a Education administrator for Dr. Hulan Saas.  This visit occurred during the SARS-CoV-2 public health emergency.  Safety protocols were in place, including screening questions prior to the visit, additional usage of staff PPE, and extensive cleaning of exam room while observing appropriate contact time as indicated for disinfecting solutions.   I'm seeing this patient by the request  of:  Patient, No Pcp Per  CC: Low back pain and neck pain follow-up  [id number]  Trequan Marsolek is a 64 y.o. male coming in with complaint of back and neck pain. OMT 09/02/2020. Patient states his neck is off. Mid back is painful.   Medications patient has been prescribed: Zanaflex, Effexor  Taking: Yes         Reviewed prior external information including notes and imaging from previsou exam, outside providers and external EMR if available.   As well as notes that were available from care everywhere and other healthcare systems.  Past medical history, social, surgical and family history all reviewed in electronic medical record.  No pertanent information unless stated regarding to the chief complaint.   Past Medical History:  Diagnosis Date  . Allergy     No Known Allergies   Review of Systems:  No headache, visual changes, nausea, vomiting, diarrhea, constipation, dizziness, abdominal pain, skin rash, fevers, chills, night sweats, weight loss, swollen lymph nodes, body aches, joint swelling, chest pain, shortness of breath, mood changes. POSITIVE muscle aches  Objective  Blood pressure 134/90, pulse 75, height 5\' 7"  (1.702 m), weight 221 lb (100.2 kg), SpO2 96 %.   General: No apparent distress alert and oriented x3 mood and affect normal, dressed appropriately.  HEENT: Pupils equal, extraocular movements intact  Respiratory: Patient's speak in  full sentences and does not appear short of breath  Cardiovascular: No lower extremity edema, non tender, no erythema  Neuro: Cranial nerves II through XII are intact, neurovascularly intact in all extremities with 2+ DTRs and 2+ pulses.  Gait normal with good balance and coordination.  MSK:  Non tender with full range of motion and good stability and symmetric strength and tone of shoulders, elbows, wrist, hip, knee and ankles bilaterally.  Back -low back exam does have some mild loss of lordosis.  Some tenderness to palpation over the right sacroiliac joint.  Mild increase in tightness of the cervical spine as well mild tightness in the parascapular region right greater than left.  Negative Spurling's.  Negative straight leg test of the lower back noted as well.  Osteopathic findings  C3 flexed rotated and side bent right T4 extended rotated and side bent right inhaled rib T7 extended rotated and side bent left L2 flexed rotated and side bent right Sacrum right on right       Assessment and Plan:    Nonallopathic problems  Decision today to treat with OMT was based on Physical Exam  After verbal consent patient was treated with HVLA, ME, FPR techniques in cervical, rib, thoracic, lumbar, and sacral  areas  Patient tolerated the procedure well with improvement in symptoms  Patient given exercises, stretches and lifestyle modifications  See medications in patient instructions if given  Patient will follow up in 4-8 weeks      The above documentation has been reviewed and is accurate and complete [name], DO       Note: This  dictation was prepared with Dragon dictation along with smaller phrase technology. Any transcriptional errors that result from this process are unintentional.

## 2020-10-07 ENCOUNTER — Encounter: Payer: Self-pay | Admitting: Family Medicine

## 2020-10-07 ENCOUNTER — Other Ambulatory Visit: Payer: Self-pay

## 2020-10-07 ENCOUNTER — Ambulatory Visit: Payer: BC Managed Care – PPO | Admitting: Family Medicine

## 2020-10-07 VITALS — BP 134/90 | HR 75 | Ht 67.0 in | Wt 221.0 lb

## 2020-10-07 DIAGNOSIS — M533 Sacrococcygeal disorders, not elsewhere classified: Secondary | ICD-10-CM

## 2020-10-07 DIAGNOSIS — M999 Biomechanical lesion, unspecified: Secondary | ICD-10-CM | POA: Diagnosis not present

## 2020-10-07 NOTE — Assessment & Plan Note (Signed)
Patient does have chronic problem.  Does have some increasing stress.  Has been sitting more at a desk.  Patient has some increasing neck pain recently as well.  Does respond well though to osteopathic manipulation.  Chronic problem with mild exacerbation.  Does have the Zanaflex.  Continuing the Effexor.  Follow-up with me again in 4 to 8 weeks

## 2020-10-07 NOTE — Patient Instructions (Addendum)
Good to see you Good luck with the end of the semester See me again in 5-6 weeks

## 2020-10-14 ENCOUNTER — Encounter: Payer: Self-pay | Admitting: Family Medicine

## 2020-10-14 ENCOUNTER — Other Ambulatory Visit: Payer: Self-pay | Admitting: Family Medicine

## 2020-10-14 MED ORDER — VENLAFAXINE HCL ER 37.5 MG PO CP24: 37.5000 mg | ORAL_CAPSULE | Freq: Every day | ORAL | 0 refills | Status: DC

## 2020-11-10 NOTE — Progress Notes (Signed)
Lucas [name] Phone: [phone] Subjective:   Lucas [name], am serving as a scribe for Dr. Hulan Saas. This visit occurred during the SARS-CoV-2 public health emergency.  Safety protocols were in place, including screening questions prior to the visit, additional usage of staff PPE, and extensive cleaning of exam room while observing appropriate contact time as indicated for disinfecting solutions.   I'm seeing this patient by the request  of:  Patient, [name] Pcp Per  CC: Back and neck pain follow-up  [id number]  Lucas [name] is a 64 y.o. male coming in with complaint of back and neck pain. OMT 10/14/2020. 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   Patient does have right shoulder pain over superior aspect. Did stop doing shoulder weight training for a while. Uncomfortable when lying on right side.   Medications patient has been prescribed: Effexor, Zanaflex  Taking: Yes         Reviewed prior external information including notes and imaging from previsou exam, outside providers and external EMR if available.   As well as notes that were available from care everywhere and other healthcare systems.  Past medical history, social, surgical and family history all reviewed in electronic medical record.  [name] pertanent information unless stated regarding to the chief complaint.   Past Medical History:  Diagnosis Date  . Allergy     [name] Known Allergies   Review of Systems:  [name] headache, visual changes, nausea, vomiting, diarrhea, constipation, dizziness, abdominal pain, skin rash, fevers, chills, night sweats, weight loss, swollen lymph nodes, body aches, joint swelling, chest pain, shortness of breath, mood changes. POSITIVE muscle aches  Objective  Blood pressure 122/88, pulse 78, height 5\' 7"  (1.702 m), weight 221 lb (100.2 kg), SpO2 97 %.   General:  [name] apparent distress alert and oriented x3 mood and affect normal, dressed appropriately.  HEENT: Pupils equal, extraocular movements intact  Respiratory: Patient's speak in full sentences and does not appear short of breath  Cardiovascular: [name] lower extremity edema, non tender, [name] erythema  Neuro: Cranial nerves II through XII are intact, neurovascularly intact in all extremities with 2+ DTRs and 2+ pulses.  Gait normal with good balance and coordination.  MSK:  Non tender with full range of motion and good stability and symmetric strength and tone of shoulders, elbows, wrist, hip, knee and ankles bilaterally.  Back -low back does have some mild loss of lordosis.  Poor core strength still noted.  Patient does have mild tightness with Corky Sox right greater than left. Osteopathic findings  C7 flexed rotated and side bent right T3 extended rotated and side bent right inhaled rib T11 extended rotated and side bent left L1 flexed rotated and side bent right Sacrum right on right       Assessment and Plan:   SI (sacroiliac) joint dysfunction Chronic problem mild exacerbation.  Patient has the Zanaflex.  Was more stress recently.  Patient will continue the other medications.  Follow-up again in 4 to 6 weeks  Right shoulder pain I believe the patient has mild impingement.  Likely secondary to a mild bursitis.  Topical anti-inflammatories given, home exercises given.  Discussed posture.  Worsening pain consider ultrasound.  X-rays are pending.  Also can consider injection.  Follow-up again in 4 to 8 weeks   Nonallopathic problems  Decision today to treat with OMT was based on Physical Exam  After  verbal consent patient was treated with HVLA, ME, FPR techniques in cervical, rib, thoracic, lumbar, and sacral  areas  Patient tolerated the procedure well with improvement in symptoms  Patient given exercises, stretches and lifestyle modifications  See medications in patient instructions if  given  Patient will follow up in 4-8 weeks      The above documentation has been reviewed and is accurate and complete [name], DO       Note: This dictation was prepared with Dragon dictation along with smaller phrase technology. Any transcriptional errors that result from this process are unintentional.

## 2020-11-11 ENCOUNTER — Encounter: Payer: Self-pay | Admitting: Family Medicine

## 2020-11-11 ENCOUNTER — Ambulatory Visit (INDEPENDENT_AMBULATORY_CARE_PROVIDER_SITE_OTHER): Payer: BC Managed Care – PPO

## 2020-11-11 ENCOUNTER — Other Ambulatory Visit: Payer: Self-pay

## 2020-11-11 ENCOUNTER — Ambulatory Visit: Payer: BC Managed Care – PPO | Admitting: Family Medicine

## 2020-11-11 VITALS — BP 122/88 | HR 78 | Ht 67.0 in | Wt 221.0 lb

## 2020-11-11 DIAGNOSIS — M25511 Pain in right shoulder: Secondary | ICD-10-CM

## 2020-11-11 DIAGNOSIS — M533 Sacrococcygeal disorders, not elsewhere classified: Secondary | ICD-10-CM | POA: Diagnosis not present

## 2020-11-11 DIAGNOSIS — M999 Biomechanical lesion, unspecified: Secondary | ICD-10-CM | POA: Diagnosis not present

## 2020-11-11 DIAGNOSIS — G8929 Other chronic pain: Secondary | ICD-10-CM | POA: Diagnosis not present

## 2020-11-11 NOTE — Patient Instructions (Signed)
Good to see you  Happy holidays! pennsaid pinkie amount topically 2 times daily as needed.  Keep hands within peripheral vision  Shoulder xray on the way out..  Otherwise see me again in 6-8 weeks

## 2020-11-11 NOTE — Assessment & Plan Note (Signed)
I believe the patient has mild impingement.  Likely secondary to a mild bursitis.  Topical anti-inflammatories given, home exercises given.  Discussed posture.  Worsening pain consider ultrasound.  X-rays are pending.  Also can consider injection.  Follow-up again in 4 to 8 weeks

## 2020-11-11 NOTE — Assessment & Plan Note (Signed)
Chronic problem mild exacerbation.  Patient has the Zanaflex.  Was more stress recently.  Patient will continue the other medications.  Follow-up again in 4 to 6 weeks

## 2020-12-09 ENCOUNTER — Encounter (INDEPENDENT_AMBULATORY_CARE_PROVIDER_SITE_OTHER): Payer: Self-pay

## 2020-12-22 NOTE — Progress Notes (Signed)
[facility] [place] Phone: [phone] Subjective:   Lucas Lucas [name], am serving as a scribe for Dr. Hulan Saas. This visit occurred during the SARS-CoV-2 public health emergency.  Safety protocols were in place, including screening questions prior to the visit, additional usage of staff PPE, and extensive cleaning of exam room while observing appropriate contact time as indicated for disinfecting solutions.   I'm seeing this patient by the request  of:  Patient, Lucas [name] Pcp Per  CC: Back and neck pain follow-up  YIR:[id number]  Lucas Lucas [name] is a 65 y.o. male coming in with complaint of back and neck pain. OMT 11/11/2020. Patient states that he is having an increase in shoulder pain. Pain is now over the middle deltoid insertion, constant ache. Patient has tried doing HEP which has not helped. Pain is present with and without motion.   Medications patient has been prescribed: Effexor  Taking: Yes         Reviewed prior external information including notes and imaging from previsou exam, outside providers and external EMR if available.   As well as notes that were available from care everywhere and other healthcare systems.  Past medical history, social, surgical and family history all reviewed in electronic medical record.  Lucas [name] pertanent information unless stated regarding to the chief complaint.   Past Medical History:  Diagnosis Date  . Allergy     Lucas [name] Known Allergies   Review of Systems:  Lucas [name] headache, visual changes, nausea, vomiting, diarrhea, constipation, dizziness, abdominal pain, skin rash, fevers, chills, night sweats, weight loss, swollen lymph nodes, body aches, joint swelling, chest pain, shortness of breath, mood changes. POSITIVE muscle aches  Objective  Blood pressure (!) 140/100, pulse 83, height 5\' 7"  (1.702 m), weight 231 lb (104.8 kg), SpO2 98 %.   General: Lucas [name] apparent distress alert  and oriented x3 mood and affect normal, dressed appropriately.  HEENT: Pupils equal, extraocular movements intact  Respiratory: Patient's speak in full sentences and does not appear short of breath  Cardiovascular: Lucas [name] lower extremity edema, non tender, Lucas [name] erythema  Neuro: Cranial nerves II through XII are intact, neurovascularly intact in all extremities with 2+ DTRs and 2+ pulses.  Gait normal with good balance and coordination.  MSK: Bilateral shoulder exam shows the patient does have good range of motion.  Lucas [name] significant signs of impingement.  5 out of 5 strength of the rotator cuff.  Neck exam does have some loss of lordosis.  Some mild tenderness to palpation in the paraspinal musculature. Low back exam shows the patient does have loss of lordosis.  Patient does have poor core strength still noted.  Patient does have tightness noted of the right sacroiliac joint more than the left.  Negative straight leg test but does have some increasing discomfort and pain with mild extension of the back.  Osteopathic findings  C2 flexed rotated and side bent right C6 flexed rotated and side bent left T3 extended rotated and side bent right inhaled rib T9 extended rotated and side bent left L2 flexed rotated and side bent right Sacrum right on right       Assessment and Plan:  Bilateral shoulder pain Patient is having more of a bilateral shoulder pain.  We discussed potential laboratory work-up.  Patient is seeing healthy weight management tomorrow so we would try to add on the laboratory work-up.  This would include testosterone levels, inflammatory markers, as well as iron and vitamin  D supplementation.  Follow-up with me again in 6 weeks.  Neck x-rays pending  SI (sacroiliac) joint dysfunction Mild tightness overall.  Has responded well to manipulation.  Has recently though increased weight and likely is contributing to some of the discomfort and pain.  Patient given some gabapentin to help with  some of the neck pain as well as likely will help some of the back pain.  Has done muscle relaxer previously.  Patient is taking the Effexor.  Follow-up again in 4 to 8 weeks    Nonallopathic problems  Decision today to treat with OMT was based on Physical Exam  After verbal consent patient was treated with HVLA, ME, FPR techniques in cervical, rib, thoracic, lumbar, and sacral  Areas did focus on muscle energy of the neck with patient having bilateral shoulder pain.  Patient tolerated the procedure well with improvement in symptoms  Patient given exercises, stretches and lifestyle modifications  See medications in patient instructions if given  Patient will follow up in 4-8 weeks      The above documentation has been reviewed and is accurate and complete [name], DO       Note: This dictation was prepared with Dragon dictation along with smaller phrase technology. Any transcriptional errors that result from this process are unintentional.

## 2020-12-23 ENCOUNTER — Ambulatory Visit: Payer: BC Managed Care – PPO | Admitting: Family Medicine

## 2020-12-23 ENCOUNTER — Telehealth: Payer: Self-pay | Admitting: Family Medicine

## 2020-12-23 ENCOUNTER — Other Ambulatory Visit: Payer: Self-pay

## 2020-12-23 ENCOUNTER — Ambulatory Visit (INDEPENDENT_AMBULATORY_CARE_PROVIDER_SITE_OTHER): Payer: BC Managed Care – PPO

## 2020-12-23 ENCOUNTER — Encounter: Payer: Self-pay | Admitting: Family Medicine

## 2020-12-23 VITALS — BP 140/100 | HR 83 | Ht 67.0 in | Wt 231.0 lb

## 2020-12-23 DIAGNOSIS — M25511 Pain in right shoulder: Secondary | ICD-10-CM

## 2020-12-23 DIAGNOSIS — M2550 Pain in unspecified joint: Secondary | ICD-10-CM

## 2020-12-23 DIAGNOSIS — M533 Sacrococcygeal disorders, not elsewhere classified: Secondary | ICD-10-CM

## 2020-12-23 DIAGNOSIS — M542 Cervicalgia: Secondary | ICD-10-CM | POA: Diagnosis not present

## 2020-12-23 DIAGNOSIS — M25512 Pain in left shoulder: Secondary | ICD-10-CM

## 2020-12-23 DIAGNOSIS — M999 Biomechanical lesion, unspecified: Secondary | ICD-10-CM

## 2020-12-23 MED ORDER — GABAPENTIN 100 MG PO CAPS: 200.0000 mg | ORAL_CAPSULE | Freq: Every day | ORAL | 0 refills | Status: DC

## 2020-12-23 NOTE — Telephone Encounter (Signed)
Patient called in response to his xray results from [facility]. He asked if someone could call him to discuss this further.  Please advise.

## 2020-12-23 NOTE — Telephone Encounter (Signed)
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 He would like to see picture and I told him Dr. Tamala Julian can show him at next visit. Also wants to know what to do for arthritis. Discussed that Dr. Tamala Julian wants him to manage his pain with gabapentin and to stay active. Patient mentions anti-inflammatory diets which I told him would be helpful to decrease overall body pain. Patient plans on sending Dr. Tamala Julian a My Chart message with additional questions.

## 2020-12-23 NOTE — Assessment & Plan Note (Signed)
Patient is having more of a bilateral shoulder pain.  We discussed potential laboratory work-up.  Patient is seeing healthy weight management tomorrow so we would try to add on the laboratory work-up.  This would include testosterone levels, inflammatory markers, as well as iron and vitamin D supplementation.  Follow-up with me again in 6 weeks.  Neck x-rays pending

## 2020-12-23 NOTE — Patient Instructions (Signed)
Gabapentin 200mg  at night Neck xray  See me in 4-5 weeks

## 2020-12-23 NOTE — Assessment & Plan Note (Signed)
Mild tightness overall.  Has responded well to manipulation.  Has recently though increased weight and likely is contributing to some of the discomfort and pain.  Patient given some gabapentin to help with some of the neck pain as well as likely will help some of the back pain.  Has done muscle relaxer previously.  Patient is taking the Effexor.  Follow-up again in 4 to 8 weeks

## 2020-12-24 ENCOUNTER — Encounter (INDEPENDENT_AMBULATORY_CARE_PROVIDER_SITE_OTHER): Payer: Self-pay | Admitting: Family Medicine

## 2020-12-24 ENCOUNTER — Ambulatory Visit (INDEPENDENT_AMBULATORY_CARE_PROVIDER_SITE_OTHER): Payer: BC Managed Care – PPO | Admitting: Family Medicine

## 2020-12-24 ENCOUNTER — Other Ambulatory Visit: Payer: Self-pay

## 2020-12-24 VITALS — BP 163/102 | HR 82 | Temp 98.0°F | Ht 66.0 in | Wt 226.0 lb

## 2020-12-24 DIAGNOSIS — G4733 Obstructive sleep apnea (adult) (pediatric): Secondary | ICD-10-CM | POA: Diagnosis not present

## 2020-12-24 DIAGNOSIS — Z0289 Encounter for other administrative examinations: Secondary | ICD-10-CM

## 2020-12-24 DIAGNOSIS — F3289 Other specified depressive episodes: Secondary | ICD-10-CM

## 2020-12-24 DIAGNOSIS — R7301 Impaired fasting glucose: Secondary | ICD-10-CM

## 2020-12-24 DIAGNOSIS — Z9189 Other specified personal risk factors, not elsewhere classified: Secondary | ICD-10-CM | POA: Diagnosis not present

## 2020-12-24 DIAGNOSIS — E65 Localized adiposity: Secondary | ICD-10-CM

## 2020-12-24 DIAGNOSIS — R5383 Other fatigue: Secondary | ICD-10-CM

## 2020-12-24 DIAGNOSIS — Z6836 Body mass index (BMI) 36.0-36.9, adult: Secondary | ICD-10-CM

## 2020-12-24 DIAGNOSIS — R0602 Shortness of breath: Secondary | ICD-10-CM

## 2020-12-24 DIAGNOSIS — M2550 Pain in unspecified joint: Secondary | ICD-10-CM

## 2020-12-25 ENCOUNTER — Encounter (INDEPENDENT_AMBULATORY_CARE_PROVIDER_SITE_OTHER): Payer: Self-pay | Admitting: Family Medicine

## 2020-12-25 DIAGNOSIS — R7989 Other specified abnormal findings of blood chemistry: Secondary | ICD-10-CM

## 2020-12-26 LAB — CBC WITH DIFFERENTIAL/PLATELET
Basophils Absolute: 0.1 10*3/uL (ref 0.0–0.2)
Basos: 1 %
EOS (ABSOLUTE): 0.3 10*3/uL (ref 0.0–0.4)
Eos: 5 %
Hemoglobin: 15.4 g/dL (ref 13.0–17.7)
Immature Grans (Abs): 0 10*3/uL (ref 0.0–0.1)
Immature Granulocytes: 0 %
Lymphocytes Absolute: 2 10*3/uL (ref 0.7–3.1)
Lymphs: 28 %
MCH: 29.1 pg (ref 26.6–33.0)
MCHC: 33.5 g/dL (ref 31.5–35.7)
MCV: 87 fL (ref 79–97)
Monocytes Absolute: 0.7 10*3/uL (ref 0.1–0.9)
Monocytes: 10 %
Neutrophils Absolute: 3.9 10*3/uL (ref 1.4–7.0)
Neutrophils: 56 %
Platelets: 282 10*3/uL (ref 150–450)
RBC: 5.29 x10E6/uL (ref 4.14–5.80)
RDW: 13.1 % (ref 11.6–15.4)
WBC: 6.9 10*3/uL (ref 3.4–10.8)

## 2020-12-26 LAB — COMPREHENSIVE METABOLIC PANEL
ALT: 28 IU/L (ref 0–44)
AST: 36 IU/L (ref 0–40)
Albumin/Globulin Ratio: 1.8 (ref 1.2–2.2)
Albumin: 4.5 g/dL (ref 3.8–4.8)
Alkaline Phosphatase: 74 IU/L (ref 44–121)
BUN/Creatinine Ratio: 12 (ref 10–24)
BUN: 11 mg/dL (ref 8–27)
Bilirubin Total: 0.5 mg/dL (ref 0.0–1.2)
CO2: 26 mmol/L (ref 20–29)
Calcium: 9.3 mg/dL (ref 8.6–10.2)
Chloride: 99 mmol/L (ref 96–106)
Creatinine, Ser: 0.92 mg/dL (ref 0.76–1.27)
GFR calc Af Amer: 102 mL/min/{1.73_m2} (ref >59)
GFR calc non Af Amer: 88 mL/min/{1.73_m2} (ref >59)
Globulin, Total: 2.5 g/dL (ref 1.5–4.5)
Glucose: 84 mg/dL (ref 65–99)
Potassium: 4.2 mmol/L (ref 3.5–5.2)
Sodium: 140 mmol/L (ref 134–144)
Total Protein: 7 g/dL (ref 6.0–8.5)

## 2020-12-26 LAB — LIPID PANEL
Chol/HDL Ratio: 5 ratio (ref 0.0–5.0)
Cholesterol, Total: 174 mg/dL (ref 100–199)
HDL: 35 mg/dL — ABNORMAL LOW (ref >39)
LDL Chol Calc (NIH): 115 mg/dL — ABNORMAL HIGH (ref 0–99)
Triglycerides: 130 mg/dL (ref 0–149)
VLDL Cholesterol Cal: 24 mg/dL (ref 5–40)

## 2020-12-26 LAB — INSULIN, RANDOM: INSULIN: 14.6 u[IU]/mL (ref 2.6–24.9)

## 2020-12-26 LAB — VITAMIN D 25 HYDROXY (VIT D DEFICIENCY, FRACTURES): Vit D, 25-Hydroxy: 20.7 ng/mL — ABNORMAL LOW (ref 30.0–100.0)

## 2020-12-26 LAB — HEMOGLOBIN A1C
Est. average glucose Bld gHb Est-mCnc: 128 mg/dL
Hgb A1c MFr Bld: 6.1 % — ABNORMAL HIGH (ref 4.8–5.6)

## 2020-12-26 LAB — PTH, INTACT AND CALCIUM: PTH: 43 pg/mL (ref 15–65)

## 2020-12-26 LAB — ANEMIA PANEL
Ferritin: 140 ng/mL (ref 30–400)
Folate, Hemolysate: 401 ng/mL
Folate, RBC: 872 ng/mL (ref >498)
Hematocrit: 46 % (ref 37.5–51.0)
Iron Saturation: 44 % (ref 15–55)
Iron: 134 ug/dL (ref 38–169)
Retic Ct Pct: 1.3 % (ref 0.6–2.6)
Total Iron Binding Capacity: 306 ug/dL (ref 250–450)
UIBC: 172 ug/dL (ref 111–343)
Vitamin B-12: 381 pg/mL (ref 232–1245)

## 2020-12-26 LAB — URIC ACID: Uric Acid: 5.6 mg/dL (ref 3.8–8.4)

## 2020-12-26 LAB — TESTOSTERONE: Testosterone: 147 ng/dL — ABNORMAL LOW (ref 264–916)

## 2020-12-26 LAB — SEDIMENTATION RATE: Sed Rate: 28 mm/hr (ref 0–30)

## 2020-12-26 LAB — ANA: ANA Titer 1: NEGATIVE

## 2020-12-26 LAB — T4, FREE: Free T4: 1.09 ng/dL (ref 0.82–1.77)

## 2020-12-26 LAB — TSH: TSH: 3.79 u[IU]/mL (ref 0.450–4.500)

## 2020-12-28 NOTE — Progress Notes (Signed)
Chief Complaint:   OBESITY Lucas Young (MR# [id number]) is a 65 y.o. male who presents for evaluation and treatment of obesity and related comorbidities. Current BMI is Body mass index is 36.48 kg/m. Aleem has been struggling with his weight for many years and has been unsuccessful in either losing weight, maintaining weight loss, or reaching his healthy weight goal.  Deano is currently in the action stage of change and ready to dedicate time achieving and maintaining a healthier weight. Laurens is interested in becoming our patient and working on intensive lifestyle modifications including (but not limited to) diet and exercise for weight loss.  Jaegar's habits were reviewed today and are as follows: His family eats meals together, he thinks his family will eat healthier with him, his desired weight loss is 63 pounds, he started gaining weight around age 32, his heaviest weight ever was 228 pounds, he craves carbs, he snacks frequently in the evenings, he is frequently drinking liquids with calories, he frequently makes poor food choices, he frequently eats larger portions than normal and he struggles with emotional eating.  Depression Screen [facility]'s Food and Mood (modified PHQ-9) score was 13.  Depression screen [facility] 2/9 12/24/2020  Decreased Interest 0  Down, Depressed, Hopeless 3  PHQ - 2 Score 3  Altered sleeping 3  Tired, decreased energy 3  Change in appetite 2  Feeling bad or failure about yourself  2  Trouble concentrating 0  Moving slowly or fidgety/restless 0  Suicidal thoughts 0  PHQ-9 Score 13   Assessment/Plan:   1. Other fatigue Marvion admits to daytime somnolence and reports waking up still tired. Patent has a history of symptoms of daytime fatigue, morning fatigue, morning headache and snoring. Ryer generally gets 6 or 7 hours of sleep per night, and states that he has poor quality sleep. Snoring is present.  Apneic episodes are present. Epworth Sleepiness Score is 8.  Zackrey does feel that his weight is causing his energy to be lower than it should be. Fatigue may be related to obesity, depression or many other causes. Labs will be ordered, and in the meanwhile, Antonino will focus on self care including making healthy food choices, increasing physical activity and focusing on stress reduction.  - EKG 12-Lead - CBC with Differential/Platelet - Comprehensive metabolic panel - Hemoglobin A1c - Insulin, random - Lipid panel - VITAMIN D 25 Hydroxy (Vit-D Deficiency, Fractures) - TSH - T4, free - Anemia panel  2. SOB (shortness of breath) on exertion Zaiyden notes increasing shortness of breath with exercising and seems to be worsening over time with weight gain. He notes getting out of breath sooner with activity than he used to. This has not gotten worse recently. Koltan denies shortness of breath at rest or orthopnea.  Izell does not feel that he gets out of breath more easily that he used to when he exercises. Srihaan's shortness of breath appears to be obesity related and exercise induced. He has agreed to work on weight loss and gradually increase exercise to treat his exercise induced shortness of breath. Will continue to monitor closely.  - CBC with Differential/Platelet - Comprehensive metabolic panel - Hemoglobin A1c - Insulin, random - Lipid panel - VITAMIN D 25 Hydroxy (Vit-D Deficiency, Fractures) - TSH - T4, free - Anemia panel  3. OSA (obstructive sleep apnea) Historical diagnosis.  He uses an oral appliance.  He had a sleep study with [facility].  OSA is a cause of systemic hypertension and is  associated with an increased incidence of stroke, heart failure, atrial fibrillation, and coronary heart disease. Severe OSA increases all-cause mortality and  cardiovascular mortality.    Goal: Treatment of OSA via CPAP compliance and weight loss. . Plasma  ghrelin levels (appetite or "hunger hormone") are significantly higher in OSA patients than in BMI-matched controls, but decrease to levels similar to those of obese patients without OSA after CPAP treatment.  . Weight loss improves OSA by several mechanisms, including reduction in fatty tissue in the throat (i.e. parapharyngeal fat) and the tongue. Loss of abdominal fat increases mediastinal traction on the upper airway making it less likely to collapse during sleep. . Studies have also shown that compliance with CPAP treatment improves leptin (hunger inhibitory hormone) imbalance.  4. Visceral obesity Current visceral fat rating: 21. Visceral fat rating should be < 13. Visceral adipose tissue is a hormonally active component of total body fat. This body composition phenotype is associated with medical disorders such as metabolic syndrome, cardiovascular disease and several malignancies including prostate, breast, and colorectal cancers. Starting goal: Lose 7-10% of starting weight.   5. Elevated fasting glucose Will check CMP, A1c, and insulin level today.  - Comprehensive metabolic panel - Hemoglobin A1c - Insulin, random  6. Polyarthralgia Will check labs today.    - Uric acid - ANA - PTH, Intact and Calcium - Sedimentation rate - Testosterone  7. Other depression, with emotional eating Seaborn says he has a "sweet tooth".  He sees Alvester Chou at [facility].  Takes Effexor XR 37.5 mg daily.  8. At risk for heart disease Due to Aztlan's current state of health and medical condition(s), he is at a higher risk for heart disease.   This puts the patient at much greater risk to subsequently develop cardiopulmonary conditions that can significantly affect patient's quality of life in a negative manner as well.    At least 8 minutes was spent on counseling Brenndan about these concerns today. Initial goal is to lose at least 5-10% of starting weight to help reduce these  risk factors.  We will continue to reassess these conditions on a fairly regular basis in an attempt to decrease patient's overall morbidity and mortality.  Evidence-based interventions for health behavior change were utilized today including the discussion of self monitoring techniques, problem-solving barriers and SMART goal setting techniques.  Specifically regarding patient's less desirable eating habits and patterns, we employed the technique of small changes when Jamaire has not been able to fully commit to his prudent nutritional plan.  9. Class 2 severe obesity with serious comorbidity and body mass index (BMI) of 36.0 to 36.9 in adult, unspecified obesity type (HCC)  Mackey is currently in the action stage of change and his goal is to continue with weight loss efforts. I recommend Madoc begin the structured treatment plan as follows:  He has agreed to the Category 4 Plan.  Exercise goals: No exercise has been prescribed at this time.   Behavioral modification strategies: increasing lean protein intake, decreasing simple carbohydrates, increasing vegetables, increasing water intake, decreasing liquid calories, decreasing alcohol intake, decreasing sodium intake and increasing high fiber foods.  He was informed of the importance of frequent follow-up visits to maximize his success with intensive lifestyle modifications for his multiple health conditions. He was informed we would discuss his lab results at his next visit unless there is a critical issue that needs to be addressed sooner. Brook agreed to keep his next visit at the agreed upon time  to discuss these results.  Objective:   Blood pressure (!) 163/102, pulse 82, temperature 98 F (36.7 C), temperature source Oral, height 5\' 6"  (1.676 m), weight 226 lb (102.5 kg), SpO2 95 %. Body mass index is 36.48 kg/m.  EKG: Normal sinus rhythm, rate 95 bpm.  Indirect Calorimeter completed today shows a VO2 of 367 and a  REE of 2555.  His calculated basal metabolic rate is 3382 thus his basal metabolic rate is better than expected.  General: Cooperative, alert, well developed, in no acute distress. HEENT: Conjunctivae and lids unremarkable. Cardiovascular: Regular rhythm.  Lungs: Normal work of breathing. Neurologic: No focal deficits.   Lab Results  Component Value Date   CREATININE 0.92 12/24/2020   BUN 11 12/24/2020   NA 140 12/24/2020   K 4.2 12/24/2020   CL 99 12/24/2020   CO2 26 12/24/2020   Lab Results  Component Value Date   ALT 28 12/24/2020   AST 36 12/24/2020   ALKPHOS 74 12/24/2020   BILITOT 0.5 12/24/2020   Lab Results  Component Value Date   HGBA1C 6.1 (H) 12/24/2020   Lab Results  Component Value Date   INSULIN 14.6 12/24/2020   Lab Results  Component Value Date   TSH 3.790 12/24/2020   Lab Results  Component Value Date   CHOL 174 12/24/2020   HDL 35 (L) 12/24/2020   LDLCALC 115 (H) 12/24/2020   TRIG 130 12/24/2020   CHOLHDL 5.0 12/24/2020   Lab Results  Component Value Date   WBC 6.9 12/24/2020   HGB 15.4 12/24/2020   HCT 46.0 12/24/2020   MCV 87 12/24/2020   PLT 282 12/24/2020   Lab Results  Component Value Date   IRON 134 12/24/2020   TIBC 306 12/24/2020   FERRITIN 140 12/24/2020   Attestation Statements:   Reviewed by clinician on day of visit: allergies, medications, problem list, medical history, surgical history, family history, social history, and previous encounter notes.  This is the patient's first visit at [facility]. The patient's NEW PATIENT PACKET was reviewed at length. Included in the packet: current and past health history, medications, allergies, ROS, gynecologic history (women only), surgical history, family history, social history, weight history, weight loss surgery history (for those that have had weight loss surgery), nutritional evaluation, mood and food questionnaire, PHQ9, Epworth questionnaire, sleep habits  questionnaire, patient life and health improvement goals questionnaire. These will all be scanned into the patient's chart under media.   During the visit, I independently reviewed the patient's EKG, bioimpedance scale results, and indirect calorimeter results. I used this information to tailor a meal plan for the patient that will help him to lose weight and will improve his obesity-related conditions going forward. I performed a medically necessary appropriate examination and/or evaluation. I discussed the assessment and treatment plan with the patient. The patient was provided an opportunity to ask questions and all were answered. The patient agreed with the plan and demonstrated an understanding of the instructions. Labs were ordered at this visit and will be reviewed at the next visit unless more critical results need to be addressed immediately. Clinical information was updated and documented in the EMR.   I, Water quality scientist, CMA, am acting as transcriptionist for Briscoe Deutscher, DO  I have reviewed the above documentation for accuracy and completeness, and I agree with the above. Briscoe Deutscher, DO

## 2020-12-31 ENCOUNTER — Encounter: Payer: Self-pay | Admitting: Family Medicine

## 2021-01-07 ENCOUNTER — Ambulatory Visit (INDEPENDENT_AMBULATORY_CARE_PROVIDER_SITE_OTHER): Payer: BC Managed Care – PPO | Admitting: Family Medicine

## 2021-01-07 ENCOUNTER — Encounter (INDEPENDENT_AMBULATORY_CARE_PROVIDER_SITE_OTHER): Payer: Self-pay | Admitting: Family Medicine

## 2021-01-07 ENCOUNTER — Other Ambulatory Visit: Payer: Self-pay

## 2021-01-07 VITALS — HR 78 | Temp 98.1°F | Ht 66.0 in | Wt 219.0 lb

## 2021-01-07 DIAGNOSIS — E559 Vitamin D deficiency, unspecified: Secondary | ICD-10-CM

## 2021-01-07 DIAGNOSIS — R632 Polyphagia: Secondary | ICD-10-CM

## 2021-01-07 DIAGNOSIS — Z9189 Other specified personal risk factors, not elsewhere classified: Secondary | ICD-10-CM | POA: Diagnosis not present

## 2021-01-07 DIAGNOSIS — Z6835 Body mass index (BMI) 35.0-35.9, adult: Secondary | ICD-10-CM

## 2021-01-07 DIAGNOSIS — R7989 Other specified abnormal findings of blood chemistry: Secondary | ICD-10-CM | POA: Diagnosis not present

## 2021-01-07 MED ORDER — CHOLECALCIFEROL 1.25 MG (50000 UT) PO TABS: ORAL_TABLET | ORAL | 0 refills | Status: DC

## 2021-01-07 MED ORDER — INSULIN PEN NEEDLE 32G X 4 MM MISC: 0 refills | Status: DC

## 2021-01-07 MED ORDER — SAXENDA 18 MG/3ML ~~LOC~~ SOPN: 3.0000 mg | PEN_INJECTOR | Freq: Every day | SUBCUTANEOUS | 3 refills | Status: DC

## 2021-01-08 LAB — TESTOSTERONE: Testosterone: 152 ng/dL — ABNORMAL LOW (ref 264–916)

## 2021-01-10 ENCOUNTER — Encounter (INDEPENDENT_AMBULATORY_CARE_PROVIDER_SITE_OTHER): Payer: Self-pay | Admitting: Family Medicine

## 2021-01-11 NOTE — Progress Notes (Signed)
Chief Complaint:   OBESITY Lucas Young is here to discuss his progress with his obesity treatment plan along with follow-up of his obesity related diagnoses.   Today's visit was #: 2 Starting weight: 226 lbs Starting date: 12/24/2020 Today's weight: 219 lbs Today's date: 01/07/2021 Total lbs lost to date: 7 lbs Body mass index is 35.35 kg/m.  Total weight loss percentage to date: -3.10%  Interim History: [name] says he has stopped eating sugar.  He has an appointment on April 18 with Dr. Jerline Pain. Nutrition Plan: Category 4 Plan for 100% of the time. Activity: Gym, weights, and walking for 2 hours 7 times per week.  Assessment/Plan:   1. Polyphagia Improving, but not optimized. Current treatment: None. Polyphagia refers to excessive feelings of hunger. He will continue to focus on protein-rich, low simple carbohydrate foods. We reviewed the importance of hydration, regular exercise for stress reduction, and restorative sleep.  2. Vitamin D deficiency Not at goal. Current vitamin D is 20.7, tested on 12/24/2020. Optimal goal > 50 ng/dL.   Plan: Start to take prescription Vitamin D @50 ,000 IU every week as prescribed.  Follow-up for routine testing of Vitamin D, at least 2-3 times per year to avoid over-replacement.  - Start Cholecalciferol 1.25 MG (50000 UT) TABS; 50,000 units PO qwk for 12 weeks.  Dispense: 12 tablet; Refill: 0  3. Low testosterone Will recheck testosterone level today, as per below. He would like to pursue testosterone supplementation if low. Note: Still need PSA.  - Testosterone  4. At risk for heart disease Due to Kainalu's current state of health and medical condition(s), he is at a higher risk for heart disease.  This puts the patient at much greater risk to subsequently develop cardiopulmonary conditions that can significantly affect patient's quality of life in a negative manner.    At least 9 minutes were spent on counseling Nana  about these concerns today. Counseling:  Intensive lifestyle modifications were discussed with Winford as the most appropriate first line of treatment.  he will continue to work on diet, exercise, and weight loss efforts.  We will continue to reassess these conditions on a fairly regular basis in an attempt to decrease the patient's overall morbidity and mortality.  Evidence-based interventions for health behavior change were utilized today including the discussion of self monitoring techniques, problem-solving barriers, and SMART goal setting techniques.  Specifically, regarding patient's less desirable eating habits and patterns, we employed the technique of small changes when [name] has not been able to fully commit to his prudent nutritional plan.  5. Class 2 severe obesity with serious comorbidity and body mass index (BMI) of 35.0 to 35.9 in adult, unspecified obesity type ([place])  - Start Liraglutide -Weight Management (SAXENDA) 18 MG/3ML SOPN; Inject 3 mg into the skin daily. 0.6 mg daily x 1 week, then 1.2 mg daily x 1 weeks, then 1.8 mg daily x 1 week, then 2.4 mg daily x 1 week, then 3 mg daily.  Dispense: 15 mL; Refill: 3 - Start Insulin Pen Needle 32G X 4 MM MISC; Daily with Saxenda  Dispense: 150 each; Refill: 0  Course: [name] is currently in the action stage of change. As such, his goal is to continue with weight loss efforts.   Nutrition goals: He has agreed to the Category 4 Plan.   Exercise goals: As is.  Behavioral modification strategies: increasing lean protein intake, decreasing simple carbohydrates, increasing vegetables, increasing water intake and decreasing liquid calories.  [name] has agreed  to follow-up with our clinic in 2 weeks. He was informed of the importance of frequent follow-up visits to maximize his success with intensive lifestyle modifications for his multiple health conditions.   Objective:   Pulse 78, temperature 98.1 F (36.7 C), height  5\' 6"  (1.676 m), weight 219 lb (99.3 kg), SpO2 98 %. Body mass index is 35.35 kg/m.  General: Cooperative, alert, well developed, in no acute distress. HEENT: Conjunctivae and lids unremarkable. Cardiovascular: Regular rhythm.  Lungs: Normal work of breathing. Neurologic: No focal deficits.   Lab Results  Component Value Date   CREATININE 0.92 12/24/2020   BUN 11 12/24/2020   NA 140 12/24/2020   K 4.2 12/24/2020   CL 99 12/24/2020   CO2 26 12/24/2020   Lab Results  Component Value Date   ALT 28 12/24/2020   AST 36 12/24/2020   ALKPHOS 74 12/24/2020   BILITOT 0.5 12/24/2020   Lab Results  Component Value Date   HGBA1C 6.1 (H) 12/24/2020   Lab Results  Component Value Date   INSULIN 14.6 12/24/2020   Lab Results  Component Value Date   TSH 3.790 12/24/2020   Lab Results  Component Value Date   CHOL 174 12/24/2020   HDL 35 (L) 12/24/2020   LDLCALC 115 (H) 12/24/2020   TRIG 130 12/24/2020   CHOLHDL 5.0 12/24/2020   Lab Results  Component Value Date   WBC 6.9 12/24/2020   HGB 15.4 12/24/2020   HCT 46.0 12/24/2020   MCV 87 12/24/2020   PLT 282 12/24/2020   Lab Results  Component Value Date   IRON 134 12/24/2020   TIBC 306 12/24/2020   FERRITIN 140 12/24/2020   Attestation Statements:   Reviewed by clinician on day of visit: allergies, medications, problem list, medical history, surgical history, family history, social history, and previous encounter notes.  I, Water quality scientist, CMA, am acting as transcriptionist for Briscoe Deutscher, DO  I have reviewed the above documentation for accuracy and completeness, and I agree with the above. Briscoe Deutscher, DO

## 2021-01-11 NOTE — Telephone Encounter (Signed)
Last seen by Dr. Wallace. 

## 2021-01-18 ENCOUNTER — Telehealth (INDEPENDENT_AMBULATORY_CARE_PROVIDER_SITE_OTHER): Payer: Self-pay

## 2021-01-18 NOTE — Telephone Encounter (Signed)
Last OV with Dr Wallace 

## 2021-01-18 NOTE — Telephone Encounter (Signed)
Patient called about his referral to [facility] which was sent to [facility] and he wants it sent to [address] [place], [place], [place] [address]

## 2021-01-18 NOTE — Telephone Encounter (Signed)
Referral faxed now that OV note is signed

## 2021-01-20 ENCOUNTER — Other Ambulatory Visit: Payer: Self-pay | Admitting: Family Medicine

## 2021-01-26 ENCOUNTER — Other Ambulatory Visit: Payer: Self-pay

## 2021-01-26 ENCOUNTER — Ambulatory Visit (INDEPENDENT_AMBULATORY_CARE_PROVIDER_SITE_OTHER): Payer: BC Managed Care – PPO | Admitting: Physician Assistant

## 2021-01-26 ENCOUNTER — Encounter (INDEPENDENT_AMBULATORY_CARE_PROVIDER_SITE_OTHER): Payer: Self-pay | Admitting: Physician Assistant

## 2021-01-26 VITALS — BP 164/89 | HR 72 | Temp 97.8°F | Ht 66.0 in | Wt 216.0 lb

## 2021-01-26 DIAGNOSIS — Z6836 Body mass index (BMI) 36.0-36.9, adult: Secondary | ICD-10-CM

## 2021-01-26 DIAGNOSIS — R7303 Prediabetes: Secondary | ICD-10-CM

## 2021-01-26 DIAGNOSIS — E559 Vitamin D deficiency, unspecified: Secondary | ICD-10-CM

## 2021-01-27 NOTE — Progress Notes (Signed)
Chief Complaint:   OBESITY Lucas Young is here to discuss his progress with his obesity treatment plan along with follow-up of his obesity related diagnoses. Lucas Young is on the Category 4 Plan and states he is following his eating plan approximately 10% of the time. Lucas Young states he is walking and at the gym for 60 minutes 5-7 times per week.  Today's visit was #: 3 Starting weight: 226 lbs Starting date: 12/24/2020 Today's weight: 216 lbs Today's date: 01/26/2021 Total lbs lost to date: 10 Total lbs lost since last in-office visit: 3  Interim History: Lucas Young did well with weight loss. He is tracking his calories and protein on My[employer] as well as following Category 4. His hunger is controlled on Saxenda 1.2 mg.  Subjective:   1. Vitamin D deficiency Lucas Young's last Vit D level was at 20.2. He is currently on prescription Vit D.  2. Pre-diabetes Lucas Young's last A1c was 6.1. He denies polyphagia. He has cut out sugar, and he is exercising regularly.  Assessment/Plan:   1. Vitamin D deficiency Low Vitamin D level contributes to fatigue and are associated with obesity, breast, and colon cancer. Lucas Young agreed to continue taking prescription Vitamin D 50,000 IU every week and will follow-up for routine testing of Vitamin D, at least 2-3 times per year to avoid over-replacement.  2. Pre-diabetes Lucas Young will continue to work on weight loss, exercise, and decreasing simple carbohydrates to help decrease the risk of diabetes.   3. Class 2 severe obesity with serious comorbidity and body mass index (BMI) of 36.0 to 36.9 in adult, unspecified obesity type (HCC) Lucas Young is currently in the action stage of change. As such, his goal is to continue with weight loss efforts. He has agreed to the Category 4 Plan.   Exercise goals: As is.  Behavioral modification strategies: meal planning and cooking strategies and keeping healthy foods in the  home.  Lucas Young has agreed to follow-up with our clinic in 2 weeks. He was informed of the importance of frequent follow-up visits to maximize his success with intensive lifestyle modifications for his multiple health conditions.   Objective:   Blood pressure (!) 164/89, pulse 72, temperature 97.8 F (36.6 C), height 5\' 6"  (1.676 m), weight 216 lb (98 kg), SpO2 95 %. Body mass index is 34.86 kg/m.  General: Cooperative, alert, well developed, in no acute distress. HEENT: Conjunctivae and lids unremarkable. Cardiovascular: Regular rhythm.  Lungs: Normal work of breathing. Neurologic: No focal deficits.   Lab Results  Component Value Date   CREATININE 0.92 12/24/2020   BUN 11 12/24/2020   NA 140 12/24/2020   K 4.2 12/24/2020   CL 99 12/24/2020   CO2 26 12/24/2020   Lab Results  Component Value Date   ALT 28 12/24/2020   AST 36 12/24/2020   ALKPHOS 74 12/24/2020   BILITOT 0.5 12/24/2020   Lab Results  Component Value Date   HGBA1C 6.1 (H) 12/24/2020   Lab Results  Component Value Date   INSULIN 14.6 12/24/2020   Lab Results  Component Value Date   TSH 3.790 12/24/2020   Lab Results  Component Value Date   CHOL 174 12/24/2020   HDL 35 (L) 12/24/2020   LDLCALC 115 (H) 12/24/2020   TRIG 130 12/24/2020   CHOLHDL 5.0 12/24/2020   Lab Results  Component Value Date   WBC 6.9 12/24/2020   HGB 15.4 12/24/2020   HCT 46.0 12/24/2020   MCV 87 12/24/2020   PLT 282 12/24/2020  Lab Results  Component Value Date   IRON 134 12/24/2020   TIBC 306 12/24/2020   FERRITIN 140 12/24/2020   Attestation Statements:   Reviewed by clinician on day of visit: allergies, medications, problem list, medical history, surgical history, family history, social history, and previous encounter notes.  Time spent on visit including pre-visit chart review and post-visit care and charting was 30 minutes.    Wilhemena Durie, am acting as transcriptionist for [employer],  PA-C.  I have reviewed the above documentation for accuracy and completeness, and I agree with the above. Abby Potash, PA-C

## 2021-01-28 NOTE — Progress Notes (Signed)
[place] Phone: [phone] Subjective:   Lucas Young, am serving as a scribe for Dr. Hulan Saas. This visit occurred during the SARS-CoV-2 public health emergency.  Safety protocols were in place, including screening questions prior to the visit, additional usage of staff PPE, and extensive cleaning of exam room while observing appropriate contact time as indicated for disinfecting solutions.  I'm seeing this patient by the request  of:  Vernie Shanks, MD  CC: Low back pain follow-up  [id number]  Lucas Young is a 65 y.o. male coming in with complaint of back and neck pain. OMT 12/23/2020. Patient states that he has gotten massage and acupuncture since we last saw him. Patient is also seeing weight management.  Patient did have x-rays of the neck and was found to have degenerative disc disease.  Medications patient has been prescribed: Effexor  Taking:  Patient did have labs at last exam showing the patient did have a fairly low testosterone.  Found to have a low vitamin D level of 20.7     Reviewed prior external information including notes and imaging from previsou exam, outside providers and external EMR if available.   As well as notes that were available from care everywhere and other healthcare systems.  Past medical history, social, surgical and family history all reviewed in electronic medical record.  Young pertanent information unless stated regarding to the chief complaint.   Past Medical History:  Diagnosis Date  . Allergy   . Anemia   . Anxiety   . Arthritis   . Back pain   . Depression   . Food allergy   . Hyperlipidemia   . Hypertension   . Lactose intolerance   . Morton's neuroma   . Obesity   . Sleep apnea     Young Known Allergies   Review of Systems:  Young headache, visual changes, nausea, vomiting, diarrhea, constipation, dizziness, abdominal pain, skin  rash, fevers, chills, night sweats, weight loss, swollen lymph nodes, , joint swelling, chest pain, shortness of breath, mood changes. POSITIVE muscle aches, body aches  Objective  Blood pressure (!) 142/108, pulse 77, height 5\' 6"  (1.676 m), weight 219 lb (99.3 kg), SpO2 96 %.   General: Young apparent distress alert and oriented x3 mood and affect normal, dressed appropriately.  Overweight HEENT: Pupils equal, extraocular movements intact  Respiratory: Patient's speak in full sentences and does not appear short of breath  Cardiovascular: Young lower extremity edema, non tender, Young erythema  Gait normal with good balance and coordination.  MSK:  Non tender with full range of motion and good stability and symmetric strength and tone of shoulders, elbows, wrist, hip, knee and ankles bilaterally.  Back -low back exam does have some loss of lordosis.  Poor core strength noted.  Patient does have mild tightness with FABER test right greater than left.  Negative straight leg test.  Osteopathic findings  C2 flexed rotated and side bent right T5 extended rotated and side bent right inhaled rib L2 flexed rotated and side bent right L4 flexed rotated and side bent left Sacrum right on right       Assessment and Plan:  Lumbar back pain Multifactorial.  Mild exacerbation.  Patient overall is doing well and with the weight loss I expect him to do significantly better.  Encouraged him to continue with this.  Young change in medications today.  Responded well to manipulation.  Follow-up  with me again 4 to 8 weeks  Vitamin D deficiency Patient is on once weekly.  Low testosterone See urology.    Nonallopathic problems  Decision today to treat with OMT was based on Physical Exam  After verbal consent patient was treated with HVLA, ME, FPR techniques in cervical, rib, thoracic, lumbar, and sacral  areas  Patient tolerated the procedure well with improvement in symptoms  Patient given exercises,  stretches and lifestyle modifications  See medications in patient instructions if given  Patient will follow up in 4-8 weeks      The above documentation has been reviewed and is accurate and complete Lucas Pulley, DO       Note: This dictation was prepared with Dragon dictation along with smaller phrase technology. Any transcriptional errors that result from this process are unintentional.

## 2021-01-29 ENCOUNTER — Other Ambulatory Visit: Payer: Self-pay

## 2021-01-29 ENCOUNTER — Ambulatory Visit: Payer: BC Managed Care – PPO | Admitting: Family Medicine

## 2021-01-29 ENCOUNTER — Encounter: Payer: Self-pay | Admitting: Family Medicine

## 2021-01-29 VITALS — BP 142/108 | HR 77 | Ht 66.0 in | Wt 219.0 lb

## 2021-01-29 DIAGNOSIS — E559 Vitamin D deficiency, unspecified: Secondary | ICD-10-CM

## 2021-01-29 DIAGNOSIS — M999 Biomechanical lesion, unspecified: Secondary | ICD-10-CM | POA: Diagnosis not present

## 2021-01-29 DIAGNOSIS — M545 Low back pain, unspecified: Secondary | ICD-10-CM

## 2021-01-29 DIAGNOSIS — R7989 Other specified abnormal findings of blood chemistry: Secondary | ICD-10-CM | POA: Insufficient documentation

## 2021-01-29 NOTE — Patient Instructions (Signed)
Tell chief hi Same old See me in 6 weeks

## 2021-01-29 NOTE — Assessment & Plan Note (Signed)
Multifactorial.  Mild exacerbation.  Patient overall is doing well and with the weight loss I expect him to do significantly better.  Encouraged him to continue with this.  No change in medications today.  Responded well to manipulation.  Follow-up with me again 4 to 8 weeks

## 2021-01-29 NOTE — Assessment & Plan Note (Signed)
See urology.

## 2021-01-29 NOTE — Assessment & Plan Note (Signed)
Patient is on once weekly.

## 2021-02-10 ENCOUNTER — Encounter (INDEPENDENT_AMBULATORY_CARE_PROVIDER_SITE_OTHER): Payer: Self-pay | Admitting: Family Medicine

## 2021-02-10 ENCOUNTER — Ambulatory Visit (INDEPENDENT_AMBULATORY_CARE_PROVIDER_SITE_OTHER): Payer: BC Managed Care – PPO | Admitting: Family Medicine

## 2021-02-10 ENCOUNTER — Other Ambulatory Visit: Payer: Self-pay

## 2021-02-10 VITALS — BP 157/94 | HR 85 | Temp 98.2°F | Ht 66.0 in | Wt 211.0 lb

## 2021-02-10 DIAGNOSIS — R7989 Other specified abnormal findings of blood chemistry: Secondary | ICD-10-CM

## 2021-02-10 DIAGNOSIS — Z9189 Other specified personal risk factors, not elsewhere classified: Secondary | ICD-10-CM

## 2021-02-10 DIAGNOSIS — E669 Obesity, unspecified: Secondary | ICD-10-CM | POA: Diagnosis not present

## 2021-02-10 DIAGNOSIS — Z6834 Body mass index (BMI) 34.0-34.9, adult: Secondary | ICD-10-CM

## 2021-02-10 DIAGNOSIS — F411 Generalized anxiety disorder: Secondary | ICD-10-CM

## 2021-02-10 DIAGNOSIS — R632 Polyphagia: Secondary | ICD-10-CM

## 2021-02-15 ENCOUNTER — Ambulatory Visit: Payer: BC Managed Care – PPO | Admitting: Urology

## 2021-02-16 NOTE — Progress Notes (Signed)
Chief Complaint:   OBESITY Lucas Young is here to discuss his progress with his obesity treatment plan along with follow-up of his obesity related diagnoses.   Today's visit was #: 4 Starting weight: 226 lbs Starting date: 12/24/2020 Today's weight: 211 lbs Today's date: 02/10/2021 Total lbs lost to date: 15 lbs Body mass index is 34.06 kg/m.  Total weight loss percentage to date: -6.64%  Interim History:  Lucas Young says that his CPAP is on backorder.  He has an appointment with Dr. Dimas Chyle on March 15, 2021.  He is down 15 pounds and says his energy has increased.  He says his mood is good.  Current Meal Plan: the Category 4 Plan for 100% of the time.  Current Exercise Plan: Cardio/strength training/walking for 60 minutes 7 times per week. Current Anti-Obesity Medications: Saxenda 2.4 mg subcutaneously daily. Side effects: None.  Assessment/Plan:   1. Polyphagia Improving, but not optimized. Current treatment: Saxenda 2.4 mg subcutaneously daily. Polyphagia refers to excessive feelings of hunger. He will continue to focus on protein-rich, low simple carbohydrate foods. We reviewed the importance of hydration, regular exercise for stress reduction, and restorative sleep.  Plan:  Reviewed increase to 3 mg daily.  2. Low testosterone Will hold on Urology visit.  Feeling more energetic/improved mood with weight loss and diet change. We will continue to monitor symptoms as they relate to his weight loss journey.  3. GAD (generalized anxiety disorder) Controlled.  Taking Effexor 37.5 mg daily.  Plan:  Continue Effexor-XR 37.5 mg daily.  Behavior modification techniques were discussed today to help Lucas Young deal with his anxiety.    4. At risk for heart disease Due to Lucas Young's current state of health and medical condition(s), he is at a higher risk for heart disease.  This puts the patient at much greater risk to subsequently develop cardiopulmonary conditions that  can significantly affect patient's quality of life in a negative manner.    At least 8 minutes were spent on counseling Lucas Young about these concerns today. Evidence-based interventions for health behavior change were utilized today including the discussion of self monitoring techniques, problem-solving barriers, and SMART goal setting techniques.  Specifically, regarding patient's less desirable eating habits and patterns, we employed the technique of small changes when Lucas Young has not been able to fully commit to his prudent nutritional plan.  5. Class 1 obesity with serious comorbidity and body mass index (BMI) of 34.0 to 34.9 in adult, unspecified obesity type  Course: Lucas Young is currently in the action stage of change. As such, his goal is to continue with weight loss efforts.   Nutrition goals: He has agreed to the Category 4 Plan.   Exercise goals: For substantial health benefits, adults should do at least 150 minutes (2 hours and 30 minutes) a week of moderate-intensity, or 75 minutes (1 hour and 15 minutes) a week of vigorous-intensity aerobic physical activity, or an equivalent combination of moderate- and vigorous-intensity aerobic activity. Aerobic activity should be performed in episodes of at least 10 minutes, and preferably, it should be spread throughout the week.  Behavioral modification strategies: increasing lean protein intake, decreasing simple carbohydrates, increasing vegetables and increasing water intake.  Zaion has agreed to follow-up with our clinic in 3 weeks. He was informed of the importance of frequent follow-up visits to maximize his success with intensive lifestyle modifications for his multiple health conditions.   Objective:   Blood pressure (!) 157/94, pulse 85, temperature 98.2 F (36.8 C), temperature source Oral,  height 5\' 6"  (1.676 m), weight 211 lb (95.7 kg), SpO2 96 %. Body mass index is 34.06 kg/m.  General: Cooperative, alert, well  developed, in no acute distress. HEENT: Conjunctivae and lids unremarkable. Cardiovascular: Regular rhythm.  Lungs: Normal work of breathing. Neurologic: No focal deficits.   Lab Results  Component Value Date   CREATININE 0.92 12/24/2020   BUN 11 12/24/2020   NA 140 12/24/2020   K 4.2 12/24/2020   CL 99 12/24/2020   CO2 26 12/24/2020   Lab Results  Component Value Date   ALT 28 12/24/2020   AST 36 12/24/2020   ALKPHOS 74 12/24/2020   BILITOT 0.5 12/24/2020   Lab Results  Component Value Date   HGBA1C 6.1 (H) 12/24/2020   Lab Results  Component Value Date   INSULIN 14.6 12/24/2020   Lab Results  Component Value Date   TSH 3.790 12/24/2020   Lab Results  Component Value Date   CHOL 174 12/24/2020   HDL 35 (L) 12/24/2020   LDLCALC 115 (H) 12/24/2020   TRIG 130 12/24/2020   CHOLHDL 5.0 12/24/2020   Lab Results  Component Value Date   WBC 6.9 12/24/2020   HGB 15.4 12/24/2020   HCT 46.0 12/24/2020   MCV 87 12/24/2020   PLT 282 12/24/2020   Lab Results  Component Value Date   IRON 134 12/24/2020   TIBC 306 12/24/2020   FERRITIN 140 12/24/2020   Attestation Statements:   Reviewed by clinician on day of visit: allergies, medications, problem list, medical history, surgical history, family history, social history, and previous encounter notes.  I, Water quality scientist, CMA, am acting as transcriptionist for Briscoe Deutscher, DO  I have reviewed the above documentation for accuracy and completeness, and I agree with the above. Briscoe Deutscher, DO

## 2021-02-22 ENCOUNTER — Encounter (INDEPENDENT_AMBULATORY_CARE_PROVIDER_SITE_OTHER): Payer: Self-pay | Admitting: Family Medicine

## 2021-02-22 DIAGNOSIS — R7989 Other specified abnormal findings of blood chemistry: Secondary | ICD-10-CM

## 2021-02-22 NOTE — Telephone Encounter (Signed)
Last OV with Dr Wallace 

## 2021-03-03 ENCOUNTER — Encounter (INDEPENDENT_AMBULATORY_CARE_PROVIDER_SITE_OTHER): Payer: Self-pay | Admitting: Family Medicine

## 2021-03-03 ENCOUNTER — Ambulatory Visit (INDEPENDENT_AMBULATORY_CARE_PROVIDER_SITE_OTHER): Payer: BC Managed Care – PPO | Admitting: Family Medicine

## 2021-03-03 ENCOUNTER — Other Ambulatory Visit: Payer: Self-pay

## 2021-03-03 VITALS — BP 148/96 | HR 66 | Temp 98.6°F | Ht 66.0 in | Wt 205.0 lb

## 2021-03-03 DIAGNOSIS — R632 Polyphagia: Secondary | ICD-10-CM

## 2021-03-03 DIAGNOSIS — E66812 Morbid (severe) obesity due to excess calories: Secondary | ICD-10-CM

## 2021-03-03 DIAGNOSIS — Z9189 Other specified personal risk factors, not elsewhere classified: Secondary | ICD-10-CM

## 2021-03-03 DIAGNOSIS — R7303 Prediabetes: Secondary | ICD-10-CM | POA: Diagnosis not present

## 2021-03-03 DIAGNOSIS — F411 Generalized anxiety disorder: Secondary | ICD-10-CM | POA: Diagnosis not present

## 2021-03-03 DIAGNOSIS — Z6836 Body mass index (BMI) 36.0-36.9, adult: Secondary | ICD-10-CM

## 2021-03-04 NOTE — Progress Notes (Signed)
[place] Phone: [phone] Subjective:   [name], am serving as a scribe for Dr. Hulan Saas. This visit occurred during the SARS-CoV-2 public health emergency.  Safety protocols were in place, including screening questions prior to the visit, additional usage of staff PPE, and extensive cleaning of exam room while observing appropriate contact time as indicated for disinfecting solutions.   I'm seeing this patient by the request  of:  Vernie Shanks, MD  CC: Neck and back pain follow-up  [id number]  Codey Burling is a 65 y.o. male coming in with complaint of back and neck pain. OMT 01/29/2021. Patient states that he is having neck tightness and pain.  Patient does not know anything specific.  Patient is trying to keep his anxiety under control.  States that when he does seems to do a little bit better.  Medications patient has been prescribed: Effexor  Taking:         Reviewed prior external information including notes and imaging from previsou exam, outside providers and external EMR if available.   As well as notes that were available from care everywhere and other healthcare systems.  Past medical history, social, surgical and family history all reviewed in electronic medical record.  No pertanent information unless stated regarding to the chief complaint.   Past Medical History:  Diagnosis Date  . Allergy   . Anemia   . Anxiety   . Arthritis   . Back pain   . Depression   . Food allergy   . Hyperlipidemia   . Hypertension   . Lactose intolerance   . Morton's neuroma   . Obesity   . Sleep apnea     No Known Allergies   Review of Systems:  No headache, visual changes, nausea, vomiting, diarrhea, constipation, dizziness, abdominal pain, skin rash, fevers, chills, night sweats, weight loss, swollen lymph nodes, body aches, joint swelling, chest pain, shortness of  breath, mood changes. POSITIVE muscle aches  Objective  Blood pressure (!) 128/98, pulse 80, height 5\' 6"  (1.676 m), weight 207 lb (93.9 kg), SpO2 97 %.   General: No apparent distress alert and oriented x3 mood and affect normal, dressed appropriately.  HEENT: Pupils equal, extraocular movements intact  Respiratory: Patient's speak in full sentences and does not appear short of breath  Cardiovascular: No lower extremity edema, non tender, no erythema  Gait normal with good balance and coordination.  MSK:  Non tender with full range of motion and good stability and symmetric strength and tone of shoulders, elbows, wrist, hip, knee and ankles bilaterally.  Back -low back exam does have some tenderness in the lumbar spine.  Patient has some pain in the thoracolumbar juncture.  Neck exam also has some mild loss of lordosis.  Some mild limited range of motion with sidebending bilaterally.  Osteopathic findings  C5 flexed rotated and side bent left T3 extended rotated and side bent right inhaled rib T9 extended rotated and side bent left L2 flexed rotated and side bent right Sacrum right on right       Assessment and Plan: Lumbar back pain Chronic problem with mild exacerbation.  Continue the Effexor.  Discussed icing and home exercises.  Discussed avoiding certain activities, continuing core strengthening and continuing weight loss.  Patient is making some progress.  Follow-up with me again in 6 weeks     Nonallopathic problems  Decision today to treat with OMT was  based on Physical Exam  After verbal consent patient was treated with HVLA, ME, FPR techniques in cervical, rib, thoracic, lumbar, and sacral  areas  Patient tolerated the procedure well with improvement in symptoms  Patient given exercises, stretches and lifestyle modifications  See medications in patient instructions if given  Patient will follow up in 4-8 weeks      The above documentation has been reviewed  and is accurate and complete [name], DO       Note: This dictation was prepared with Dragon dictation along with smaller phrase technology. Any transcriptional errors that result from this process are unintentional.

## 2021-03-09 ENCOUNTER — Encounter: Payer: Self-pay | Admitting: Family Medicine

## 2021-03-09 ENCOUNTER — Other Ambulatory Visit: Payer: Self-pay

## 2021-03-09 ENCOUNTER — Ambulatory Visit: Payer: BC Managed Care – PPO | Admitting: Family Medicine

## 2021-03-09 VITALS — BP 128/98 | HR 80 | Ht 66.0 in | Wt 207.0 lb

## 2021-03-09 DIAGNOSIS — M9902 Segmental and somatic dysfunction of thoracic region: Secondary | ICD-10-CM | POA: Diagnosis not present

## 2021-03-09 DIAGNOSIS — M9901 Segmental and somatic dysfunction of cervical region: Secondary | ICD-10-CM

## 2021-03-09 DIAGNOSIS — M9903 Segmental and somatic dysfunction of lumbar region: Secondary | ICD-10-CM

## 2021-03-09 DIAGNOSIS — M9904 Segmental and somatic dysfunction of sacral region: Secondary | ICD-10-CM

## 2021-03-09 DIAGNOSIS — M545 Low back pain, unspecified: Secondary | ICD-10-CM

## 2021-03-09 DIAGNOSIS — M9908 Segmental and somatic dysfunction of rib cage: Secondary | ICD-10-CM | POA: Diagnosis not present

## 2021-03-09 NOTE — Patient Instructions (Signed)
Good to see you Keep working on weight you are doing great See me again in 6-8 weeks

## 2021-03-09 NOTE — Progress Notes (Signed)
Chief Complaint:   OBESITY Lucas Young is here to discuss his progress with his obesity treatment plan along with follow-up of his obesity related diagnoses.   Today's visit was #: 5 Starting weight: 226 lbs Starting date: 12/24/2020 Today's weight: 205 lbs Today's date: 03/03/2021 Total lbs lost to date: 21 lbs Body mass index is 33.09 kg/m.  Total weight loss percentage to date: -9.29%  Interim History: Lucas Young says he is happy with his plan and progress. He is currently taking Saxenda 3 mg daily and his constipation has resolved. Current Meal Plan: the Category 4 Plan for 95 % of the time.  Current Exercise Plan: Strength training/cardio/walking for 60+ minutes 7 times per week. Current Anti-Obesity Medications: Saxenda 3 mg subcutaneously daily. Side effects: None.  Assessment/Plan:   1. Polyphagia Improving, but not optimized. Current treatment: Saxenda 3 mg subcutaneously daily. Polyphagia refers to excessive feelings of hunger. He will continue to focus on protein-rich, low simple carbohydrate foods. We reviewed the importance of hydration, regular exercise for stress reduction, and restorative sleep.  Plan: Continue taking Saxenda 3 mg subcutaneously daily  2. Pre-diabetes Not at goal. Goal is HgbA1c < 5.7.  Medication: None.    Plan:  He will continue to focus on protein-rich, low simple carbohydrate foods. We reviewed the importance of hydration, regular exercise for stress reduction, and restorative sleep.   Lab Results  Component Value Date   HGBA1C 6.1 (H) 12/24/2020   Lab Results  Component Value Date   INSULIN 14.6 12/24/2020   3. GAD (generalized anxiety disorder) Controlled.  Taking Effexor 37.5 mg daily.  Plan:  Continue Effexor-XR 37.5 mg daily.  Behavior modification techniques were discussed today to help Lucas Young deal with his anxiety.    4. At risk for heart disease Due to Lucas Young's current state of health and medical  condition(s), he is at a higher risk for heart disease.  This puts the patient at much greater risk to subsequently develop cardiopulmonary conditions that can significantly affect patient's quality of life in a negative manner.    At least 8 minutes were spent on counseling Lucas Young about these concerns today. Evidence-based interventions for health behavior change were utilized today including the discussion of self monitoring techniques, problem-solving barriers, and SMART goal setting techniques.  Specifically, regarding patient's less desirable eating habits and patterns, we employed the technique of small changes when Lucas Young has not been able to fully commit to his prudent nutritional plan.  5. Obesity, current BMI 33.1  Course: Lucas Young is currently in the action stage of change. As such, his goal is to continue with weight loss efforts.   Nutrition goals: He has agreed to the Category 4 Plan.   Exercise goals: For substantial health benefits, adults should do at least 150 minutes (2 hours and 30 minutes) a week of moderate-intensity, or 75 minutes (1 hour and 15 minutes) a week of vigorous-intensity aerobic physical activity, or an equivalent combination of moderate- and vigorous-intensity aerobic activity. Aerobic activity should be performed in episodes of at least 10 minutes, and preferably, it should be spread throughout the week.  Behavioral modification strategies: increasing lean protein intake, decreasing simple carbohydrates, increasing vegetables and increasing water intake.  Lucas Young has agreed to follow-up with our clinic in 4 weeks. He was informed of the importance of frequent follow-up visits to maximize his success with intensive lifestyle modifications for his multiple health conditions.   Objective:   Blood pressure (!) 148/96, pulse 66, temperature 98.6 F (37  C), temperature source Oral, height 5\' 6"  (1.676 m), weight 205 lb (93 kg), SpO2 94 %. Body mass  index is 33.09 kg/m.  General: Cooperative, alert, well developed, in no acute distress. HEENT: Conjunctivae and lids unremarkable. Cardiovascular: Regular rhythm.  Lungs: Normal work of breathing. Neurologic: No focal deficits.   Lab Results  Component Value Date   CREATININE 0.92 12/24/2020   BUN 11 12/24/2020   NA 140 12/24/2020   K 4.2 12/24/2020   CL 99 12/24/2020   CO2 26 12/24/2020   Lab Results  Component Value Date   ALT 28 12/24/2020   AST 36 12/24/2020   ALKPHOS 74 12/24/2020   BILITOT 0.5 12/24/2020   Lab Results  Component Value Date   HGBA1C 6.1 (H) 12/24/2020   Lab Results  Component Value Date   INSULIN 14.6 12/24/2020   Lab Results  Component Value Date   TSH 3.790 12/24/2020   Lab Results  Component Value Date   CHOL 174 12/24/2020   HDL 35 (L) 12/24/2020   LDLCALC 115 (H) 12/24/2020   TRIG 130 12/24/2020   CHOLHDL 5.0 12/24/2020   Lab Results  Component Value Date   WBC 6.9 12/24/2020   HGB 15.4 12/24/2020   HCT 46.0 12/24/2020   MCV 87 12/24/2020   PLT 282 12/24/2020   Lab Results  Component Value Date   IRON 134 12/24/2020   TIBC 306 12/24/2020   FERRITIN 140 12/24/2020   Attestation Statements:   Reviewed by clinician on day of visit: allergies, medications, problem list, medical history, surgical history, family history, social history, and previous encounter notes.  Lucas Young, CMA, am acting as Location manager for [employer], DO.  I have reviewed the above documentation for accuracy and completeness, and I agree with the above. Briscoe Deutscher, DO

## 2021-03-09 NOTE — Assessment & Plan Note (Signed)
Chronic problem with mild exacerbation.  Continue the Effexor.  Discussed icing and home exercises.  Discussed avoiding certain activities, continuing core strengthening and continuing weight loss.  Patient is making some progress.  Follow-up with me again in 6 weeks

## 2021-03-15 ENCOUNTER — Other Ambulatory Visit: Payer: Self-pay

## 2021-03-15 ENCOUNTER — Ambulatory Visit (INDEPENDENT_AMBULATORY_CARE_PROVIDER_SITE_OTHER): Payer: BC Managed Care – PPO | Admitting: Family Medicine

## 2021-03-15 ENCOUNTER — Encounter: Payer: Self-pay | Admitting: Family Medicine

## 2021-03-15 VITALS — BP 165/106 | HR 69 | Temp 98.6°F | Ht 66.0 in | Wt 210.0 lb

## 2021-03-15 DIAGNOSIS — Z1211 Encounter for screening for malignant neoplasm of colon: Secondary | ICD-10-CM | POA: Diagnosis not present

## 2021-03-15 DIAGNOSIS — F419 Anxiety disorder, unspecified: Secondary | ICD-10-CM

## 2021-03-15 DIAGNOSIS — R7989 Other specified abnormal findings of blood chemistry: Secondary | ICD-10-CM | POA: Diagnosis not present

## 2021-03-15 DIAGNOSIS — J309 Allergic rhinitis, unspecified: Secondary | ICD-10-CM | POA: Insufficient documentation

## 2021-03-15 DIAGNOSIS — G4733 Obstructive sleep apnea (adult) (pediatric): Secondary | ICD-10-CM | POA: Diagnosis not present

## 2021-03-15 DIAGNOSIS — E669 Obesity, unspecified: Secondary | ICD-10-CM

## 2021-03-15 DIAGNOSIS — Z9989 Dependence on other enabling machines and devices: Secondary | ICD-10-CM

## 2021-03-15 NOTE — Assessment & Plan Note (Signed)
-   Continue Effexor 37.5 mg daily

## 2021-03-15 NOTE — Patient Instructions (Signed)
It was very nice to see you today!  I am glad you are doing well.  I will place referral for your colonoscopy.  Please keep an eye on your blood pressure and let me know if persistently 140/90 or higher.  I will see back in year for your annual physical.  Come back to see me sooner if needed.  Take care, Dr Jerline Pain  PLEASE NOTE:  If you had any lab tests please let us know if you have not heard back within a few days. You may see your results on mychart before we have a chance to review them but we will give you a call once they are reviewed by [place]. If we ordered any referrals today, please let us know if you have not heard from their office within the next week.   Please try these tips to maintain a healthy lifestyle:   Eat at least 3 REAL meals and 1-2 snacks per day.  Aim for no more than 5 hours between eating.  If you eat breakfast, please do so within one hour of getting up.    Each meal should contain half fruits/vegetables, one quarter protein, and one quarter carbs (no bigger than a computer mouse)   Cut down on sweet beverages. This includes juice, soda, and sweet tea.     Drink at least 1 glass of water with each meal and aim for at least 8 glasses per day   Exercise at least 150 minutes every week.

## 2021-03-15 NOTE — Progress Notes (Signed)
Lucas Young is a 65 y.o. male who presents today for an office visit.  He is a new patient.  Assessment/Plan:  New/Acute Problems: Elevated BP reading Elevated today.  Has been well controlled at home.  Continue home monitoring goal 140/90 or lower.  He is working on weight loss which should help.  Chronic Problems Addressed Today: Allergic rhinitis Follows with allergist. On astelin, flonase, desloratadine and singular. Also on allergy shots. Symptoms are stable.   Anxiety Continue Effexor 37.5 mg daily.  OSA on CPAP Continue management per sleep medicine.  Low testosterone He will be following up with endocrinology soon.  Obesity Follows with healthy weight and wellness. Has lost 20lb in last 3 months. Will continue lifestyle modifications.     Subjective:  HPI:  See A/p for status of chronic conditions.  PMH:  The following were reviewed and entered/updated in epic: Past Medical History:  Diagnosis Date  . Allergy   . Anemia   . Anxiety   . Arthritis   . Back pain   . Depression   . Food allergy   . Hyperlipidemia   . Hypertension   . Lactose intolerance   . Morton's neuroma   . Obesity   . Sleep apnea    Patient Active Problem List   Diagnosis Date Noted  . OSA on CPAP 03/15/2021  . Allergic rhinitis 03/15/2021  . Anxiety 03/15/2021  . Obesity 03/15/2021  . Vitamin D deficiency 01/29/2021  . Low testosterone 01/29/2021  . Bilateral shoulder pain 12/23/2020  . Nonallopathic lesion of rib cage 07/02/2018  . Nonallopathic lesion of cervical region 07/02/2018  . 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 Paronychia of third finger of left hand 08/03/2015  . Lumbar back pain 12/22/2014  . Morton's neuroma of left foot 12/22/2014  . Nonallopathic lesion of lumbosacral region 12/22/2014  . Nonallopathic lesion of sacral region 12/22/2014  . Nonallopathic lesion of thoracic region 12/22/2014   Past Surgical History:  Procedure  Laterality Date  . APPENDECTOMY    . UMBILICAL HERNIA REPAIR      Family History  Problem Relation Age of Onset  . Hypertension Mother   . Sleep apnea Mother   . Obesity Mother   . Hypertension Father   . Hyperlipidemia Father   . Heart disease Father   . Sleep apnea Father   . Obesity Father   . Hypertension Maternal Grandmother   . Hypertension Maternal Grandfather     Medications- reviewed and updated Current Outpatient Medications  Medication Sig Dispense Refill  . Azelastine HCl 137 MCG/SPRAY SOLN 1-2 puffs in each nostril    . Cholecalciferol 1.25 MG (50000 UT) TABS 50,000 units PO qwk for 12 weeks. 12 tablet 0  . desloratadine (CLARINEX) 5 MG tablet Take 5 mg by mouth daily.    . fluticasone (FLONASE) 50 MCG/ACT nasal spray Place into both nostrils.    . Insulin Pen Needle 32G X 4 MM MISC Daily with Saxenda 150 each 0  . Liraglutide -Weight Management (SAXENDA) 18 MG/3ML SOPN Inject 3 mg into the skin daily. 0.6 mg daily x 1 week, then 1.2 mg daily x 1 weeks, then 1.8 mg daily x 1 week, then 2.4 mg daily x 1 week, then 3 mg daily. 15 mL 3  . montelukast (SINGULAIR) 10 MG tablet Take 10 mg by mouth at bedtime.    . Multiple Vitamin (MULTIVITAMIN) capsule Take 1 capsule by mouth daily.    Marland Kitchen venlafaxine XR (EFFEXOR-XR) 37.5  MG 24 hr capsule TAKE 1 CAPSULE ONCE DAILY WITH BREAKFAST. 90 capsule 0   No current facility-administered medications for this visit.    Allergies-reviewed and updated No Known Allergies  Social History   Socioeconomic History  . Marital status: Married    Spouse name: Not on file  . Number of children: Not on file  . Years of education: Not on file  . Highest education level: Not on file  Occupational History  . Occupation: Professor  Tobacco Use  . Smoking status: Never Smoker  . Smokeless tobacco: Never Used  Substance and Sexual Activity  . Alcohol use: No    Alcohol/week: 0.0 standard drinks  . Drug use: No  . Sexual activity: Not  on file  Other Topics Concern  . Not on file  Social History Narrative  . Not on file   Social Determinants of Health   Financial Resource Strain: Not on file  Food Insecurity: Not on file  Transportation Needs: Not on file  Physical Activity: Not on file  Stress: Not on file  Social Connections: Not on file        Objective:  Physical Exam: BP (!) 165/106   Pulse 69   Temp 98.6 F (37 C)   Ht 5\' 6"  (1.676 m)   Wt 210 lb (95.3 kg)   SpO2 96%   BMI 33.89 kg/m   Gen: No acute distress, resting comfortably CV: Regular rate and rhythm with no murmurs appreciated Pulm: Normal work of breathing, clear to auscultation bilaterally with no crackles, wheezes, or rhonchi Neuro: Grossly normal, moves all extremities Psych: Normal affect and thought content      Jemel Ono M. Jerline Pain, MD 03/15/2021 2:27 PM

## 2021-03-15 NOTE — Assessment & Plan Note (Signed)
He will be following up with endocrinology soon.

## 2021-03-15 NOTE — Assessment & Plan Note (Signed)
Follows with allergist. On astelin, flonase, desloratadine and singular. Also on allergy shots. Symptoms are stable.

## 2021-03-15 NOTE — Assessment & Plan Note (Signed)
Follows with healthy weight and wellness. Has lost 20lb in last 3 months. Will continue lifestyle modifications.

## 2021-03-15 NOTE — Assessment & Plan Note (Signed)
Continue management per sleep medicine.

## 2021-03-24 ENCOUNTER — Ambulatory Visit (INDEPENDENT_AMBULATORY_CARE_PROVIDER_SITE_OTHER): Payer: BC Managed Care – PPO | Admitting: Physician Assistant

## 2021-03-24 ENCOUNTER — Other Ambulatory Visit: Payer: Self-pay

## 2021-03-24 ENCOUNTER — Encounter (INDEPENDENT_AMBULATORY_CARE_PROVIDER_SITE_OTHER): Payer: Self-pay | Admitting: Physician Assistant

## 2021-03-24 VITALS — BP 157/94 | HR 80 | Temp 98.4°F | Ht 66.0 in | Wt 204.0 lb

## 2021-03-24 DIAGNOSIS — E559 Vitamin D deficiency, unspecified: Secondary | ICD-10-CM

## 2021-03-24 DIAGNOSIS — R7303 Prediabetes: Secondary | ICD-10-CM | POA: Diagnosis not present

## 2021-03-24 DIAGNOSIS — E7849 Other hyperlipidemia: Secondary | ICD-10-CM | POA: Diagnosis not present

## 2021-03-24 DIAGNOSIS — E66812 Obesity, class 2: Secondary | ICD-10-CM

## 2021-03-24 DIAGNOSIS — Z9189 Other specified personal risk factors, not elsewhere classified: Secondary | ICD-10-CM

## 2021-03-24 DIAGNOSIS — Z6835 Body mass index (BMI) 35.0-35.9, adult: Secondary | ICD-10-CM

## 2021-03-24 MED ORDER — CHOLECALCIFEROL 1.25 MG (50000 UT) PO TABS: ORAL_TABLET | ORAL | 0 refills | Status: DC

## 2021-03-24 MED ORDER — SAXENDA 18 MG/3ML ~~LOC~~ SOPN
3.0000 mg | PEN_INJECTOR | Freq: Every day | SUBCUTANEOUS | 3 refills | Status: DC
Start: 2021-03-24 — End: 2021-04-14

## 2021-03-25 ENCOUNTER — Encounter (INDEPENDENT_AMBULATORY_CARE_PROVIDER_SITE_OTHER): Payer: Self-pay | Admitting: Physician Assistant

## 2021-03-25 LAB — COMPREHENSIVE METABOLIC PANEL
ALT: 25 IU/L (ref 0–44)
AST: 23 IU/L (ref 0–40)
Albumin/Globulin Ratio: 2.1 (ref 1.2–2.2)
Albumin: 4.7 g/dL (ref 3.8–4.8)
Alkaline Phosphatase: 72 IU/L (ref 44–121)
BUN/Creatinine Ratio: 24 (ref 10–24)
BUN: 19 mg/dL (ref 8–27)
Bilirubin Total: 0.4 mg/dL (ref 0.0–1.2)
CO2: 21 mmol/L (ref 20–29)
Calcium: 9.4 mg/dL (ref 8.6–10.2)
Chloride: 100 mmol/L (ref 96–106)
Creatinine, Ser: 0.8 mg/dL (ref 0.76–1.27)
Globulin, Total: 2.2 g/dL (ref 1.5–4.5)
Glucose: 92 mg/dL (ref 65–99)
Potassium: 4 mmol/L (ref 3.5–5.2)
Sodium: 141 mmol/L (ref 134–144)
Total Protein: 6.9 g/dL (ref 6.0–8.5)
eGFR: 99 mL/min/{1.73_m2} (ref >59)

## 2021-03-25 LAB — LIPID PANEL
Chol/HDL Ratio: 5.1 ratio — ABNORMAL HIGH (ref 0.0–5.0)
Cholesterol, Total: 162 mg/dL (ref 100–199)
HDL: 32 mg/dL — ABNORMAL LOW (ref >39)
LDL Chol Calc (NIH): 109 mg/dL — ABNORMAL HIGH (ref 0–99)
Triglycerides: 116 mg/dL (ref 0–149)
VLDL Cholesterol Cal: 21 mg/dL (ref 5–40)

## 2021-03-25 LAB — HEMOGLOBIN A1C
Est. average glucose Bld gHb Est-mCnc: 120 mg/dL
Hgb A1c MFr Bld: 5.8 % — ABNORMAL HIGH (ref 4.8–5.6)

## 2021-03-25 LAB — VITAMIN D 25 HYDROXY (VIT D DEFICIENCY, FRACTURES): Vit D, 25-Hydroxy: 75.6 ng/mL (ref 30.0–100.0)

## 2021-03-25 LAB — INSULIN, RANDOM: INSULIN: 14.9 u[IU]/mL (ref 2.6–24.9)

## 2021-03-25 NOTE — Progress Notes (Signed)
Chief Complaint:   OBESITY Lucas Young is here to discuss his progress with his obesity treatment plan along with follow-up of his obesity related diagnoses. Lucas Young is on the Category 4 Plan and states he is following his eating plan approximately 99% of the time. Lucas Young states he is walking, doing yoga, and weights for 60 minutes 5-7 times per week.  Today's visit was #: 6 Starting weight: 226 lbs Starting date: 12/24/2020 Today's weight: 204 lbs Today's date: 03/24/2021 Total lbs lost to date: 22 Total lbs lost since last in-office visit: 1  Interim History: Lucas Young is averaging between 1400-1600 calories daily and 150 grams of protein. He is not eating enough and he states that he cannot eat more, as the Saxenda 3.0 mg decreases his appetite.  Subjective:   1. Pre-diabetes Lucas Young notes Saxenda decreases his appetite, and he denies polyphagia. Last A1c was 6.1. He is exercising regularly.   2. Vitamin D deficiency Lucas Young is on Vit D, and he denies nausea, vomiting, or muscle weakness.  3. Other hyperlipidemia Lucas Young is not on medications, and he denies chest pain. He is exercising regularly.  4. At risk for heart disease Lucas Young is at a higher than average risk for cardiovascular disease due to obesity.   Assessment/Plan:   1. Pre-diabetes Lucas Young will continue with his meal plan, exercise, and decreasing simple carbohydrates to help decrease the risk of diabetes. We will check labs today.  - Comprehensive metabolic panel - Hemoglobin A1c - Insulin, random  2. Vitamin D deficiency Low Vitamin D level contributes to fatigue and are associated with obesity, breast, and colon cancer. We check labs today, and we will refill prescription Vitamin D for 90 days with no refills. Lucas Young will follow-up for routine testing of Vitamin D, at least 2-3 times per year to avoid over-replacement.  - Cholecalciferol 1.25 MG (50000 UT) TABS;  50,000 units PO qwk for 12 weeks.  Dispense: 12 tablet; Refill: 0 - VITAMIN D 25 Hydroxy (Vit-D Deficiency, Fractures)  3. Other hyperlipidemia Cardiovascular risk and specific lipid/LDL goals reviewed. We discussed several lifestyle modifications today. Lucas Young will continue to work on diet, exercise and weight loss efforts. We will check labs today. Orders and follow up as documented in patient record.   Counseling Intensive lifestyle modifications are the first line treatment for this issue. . Dietary changes: Increase soluble fiber. Decrease simple carbohydrates. . Exercise changes: Moderate to vigorous-intensity aerobic activity 150 minutes per week if tolerated. . Lipid-lowering medications: see documented in medical record.  - Lipid panel  4. At risk for heart disease Lucas Young was given approximately 15 minutes of coronary artery disease prevention counseling today. He is 65 y.o. male and has risk factors for heart disease including obesity. We discussed intensive lifestyle modifications today with an emphasis on specific weight loss instructions and strategies.   Repetitive spaced learning was employed today to elicit superior memory formation and behavioral change.  5. Class 2 severe obesity with serious comorbidity and body mass index (BMI) of 35.0 to 35.9 in adult, unspecified obesity type (HCC) Lucas Young is currently in the action stage of change. As such, his goal is to continue with weight loss efforts. He has agreed to the Category 4 Plan.   We discussed various medication options to help Lucas Young with his weight loss efforts and we both agreed to continue Saxenda, and we will refill for 1 month (decrease Saxenda to 2.4 mg).  - Liraglutide -Weight Management (SAXENDA) 18 MG/3ML SOPN; Inject 3 mg  into the skin daily. 0.6 mg daily x 1 week, then 1.2 mg daily x 1 weeks, then 1.8 mg daily x 1 week, then 2.4 mg daily x 1 week, then 3 mg daily.  Dispense: 15 mL; Refill:  3  Exercise goals: As is.  Behavioral modification strategies: meal planning and cooking strategies and keeping healthy foods in the home.  Lucas Young has agreed to follow-up with our clinic in 3 weeks. He was informed of the importance of frequent follow-up visits to maximize his success with intensive lifestyle modifications for his multiple health conditions.   Lucas Young was informed we would discuss his lab results at his next visit unless there is a critical issue that needs to be addressed sooner. Lucas Young agreed to keep his next visit at the agreed upon time to discuss these results.  Objective:   Blood pressure (!) 157/94, pulse 80, temperature 98.4 F (36.9 C), height 5\' 6"  (1.676 m), weight 204 lb (92.5 kg), SpO2 97 %. Body mass index is 32.93 kg/m.  General: Cooperative, alert, well developed, in no acute distress. HEENT: Conjunctivae and lids unremarkable. Cardiovascular: Regular rhythm.  Lungs: Normal work of breathing. Neurologic: No focal deficits.   Lab Results  Component Value Date   CREATININE 0.80 03/24/2021   BUN 19 03/24/2021   NA 141 03/24/2021   K 4.0 03/24/2021   CL 100 03/24/2021   CO2 21 03/24/2021   Lab Results  Component Value Date   ALT 25 03/24/2021   AST 23 03/24/2021   ALKPHOS 72 03/24/2021   BILITOT 0.4 03/24/2021   Lab Results  Component Value Date   HGBA1C 5.8 (H) 03/24/2021   HGBA1C 6.1 (H) 12/24/2020   Lab Results  Component Value Date   INSULIN 14.9 03/24/2021   INSULIN 14.6 12/24/2020   Lab Results  Component Value Date   TSH 3.790 12/24/2020   Lab Results  Component Value Date   CHOL 162 03/24/2021   HDL 32 (L) 03/24/2021   LDLCALC 109 (H) 03/24/2021   TRIG 116 03/24/2021   CHOLHDL 5.1 (H) 03/24/2021   Lab Results  Component Value Date   WBC 6.9 12/24/2020   HGB 15.4 12/24/2020   HCT 46.0 12/24/2020   MCV 87 12/24/2020   PLT 282 12/24/2020   Lab Results  Component Value Date   IRON 134 12/24/2020    TIBC 306 12/24/2020   FERRITIN 140 12/24/2020   Attestation Statements:   Reviewed by clinician on day of visit: allergies, medications, problem list, medical history, surgical history, family history, social history, and previous encounter notes.   Wilhemena Durie, am acting as transcriptionist for [employer], PA-C.  I have reviewed the above documentation for accuracy and completeness, and I agree with the above. Abby Potash, PA-C

## 2021-03-25 NOTE — Telephone Encounter (Signed)
Last seen Tracey 

## 2021-03-26 IMAGING — DX DG SHOULDER 2+V*R*
3 series · 3 of 3 positions shown · non-contrast
Comparison: None.

CLINICAL DATA: Right shoulder pain for 8 weeks.

EXAM:
RIGHT SHOULDER - 2+ VIEW

[shoulder ap (1 of 2)]
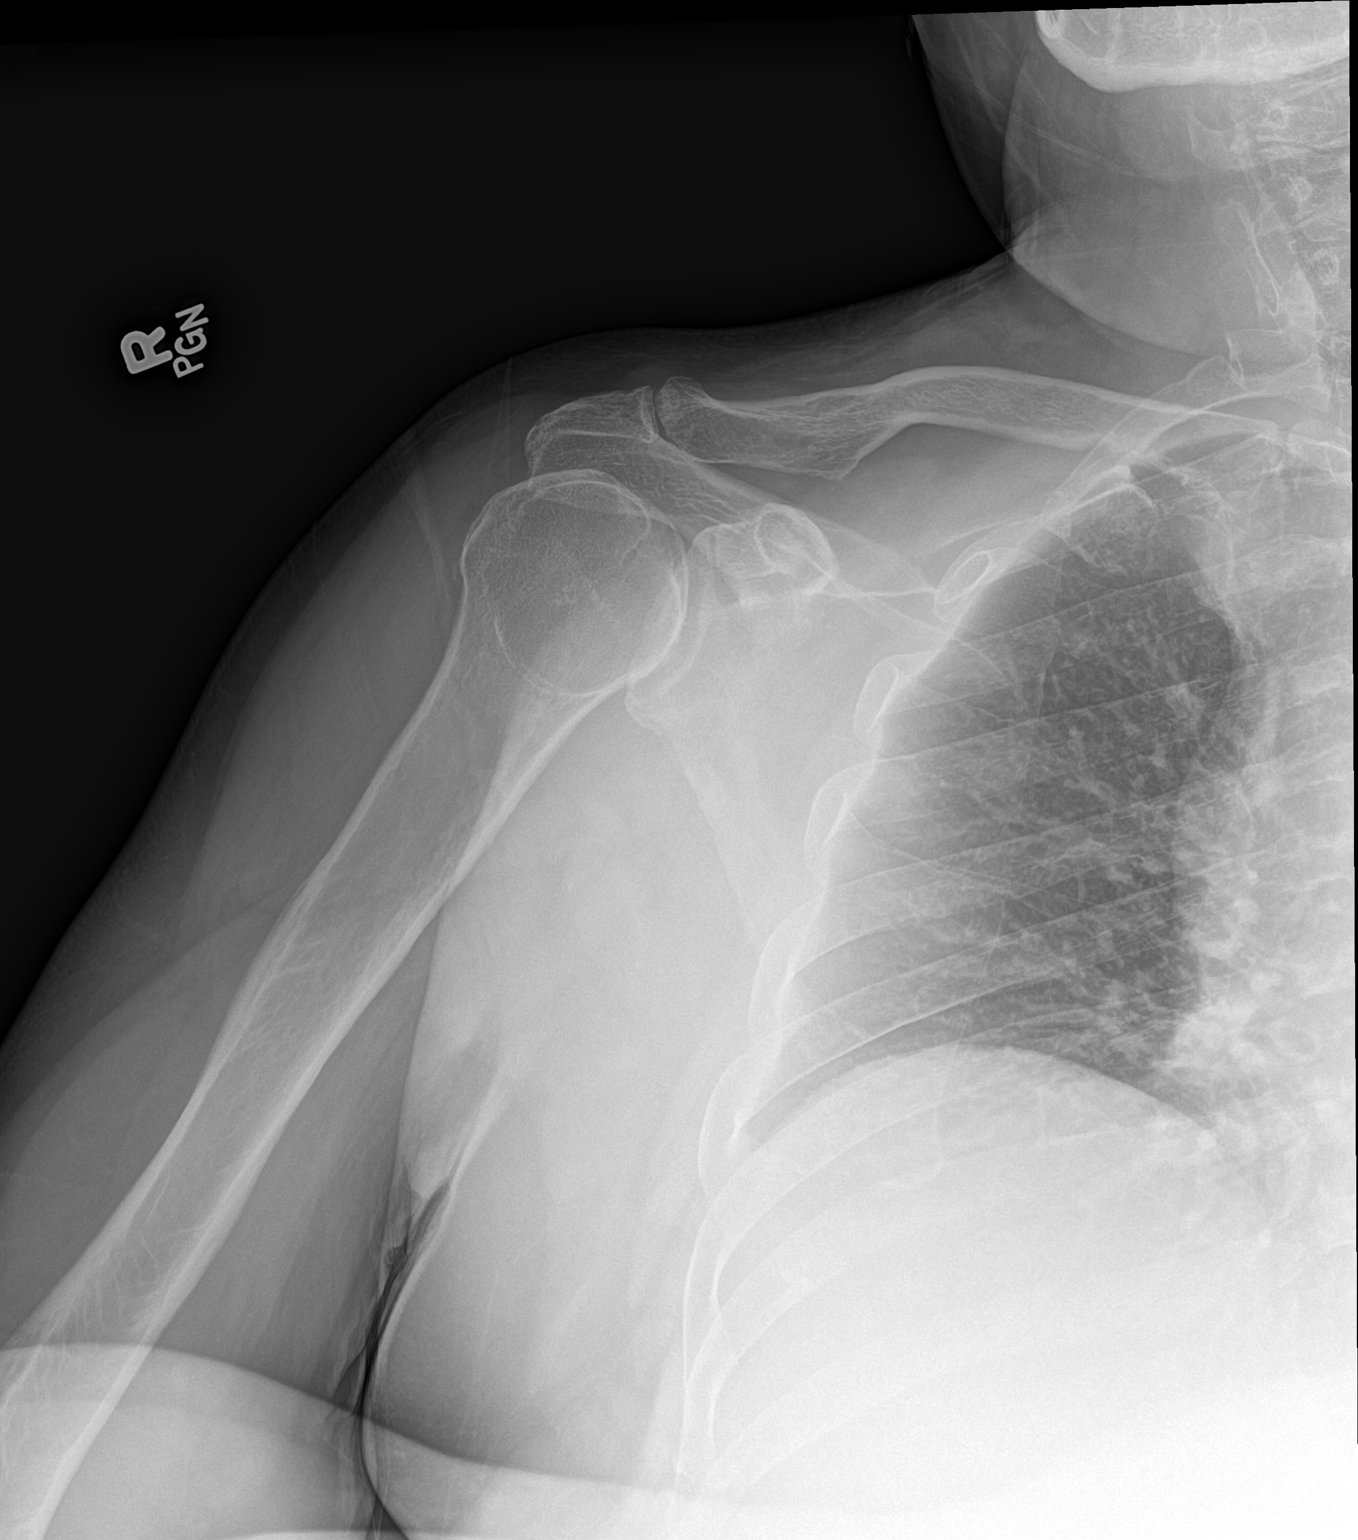

[shoulder ap (2 of 2)]
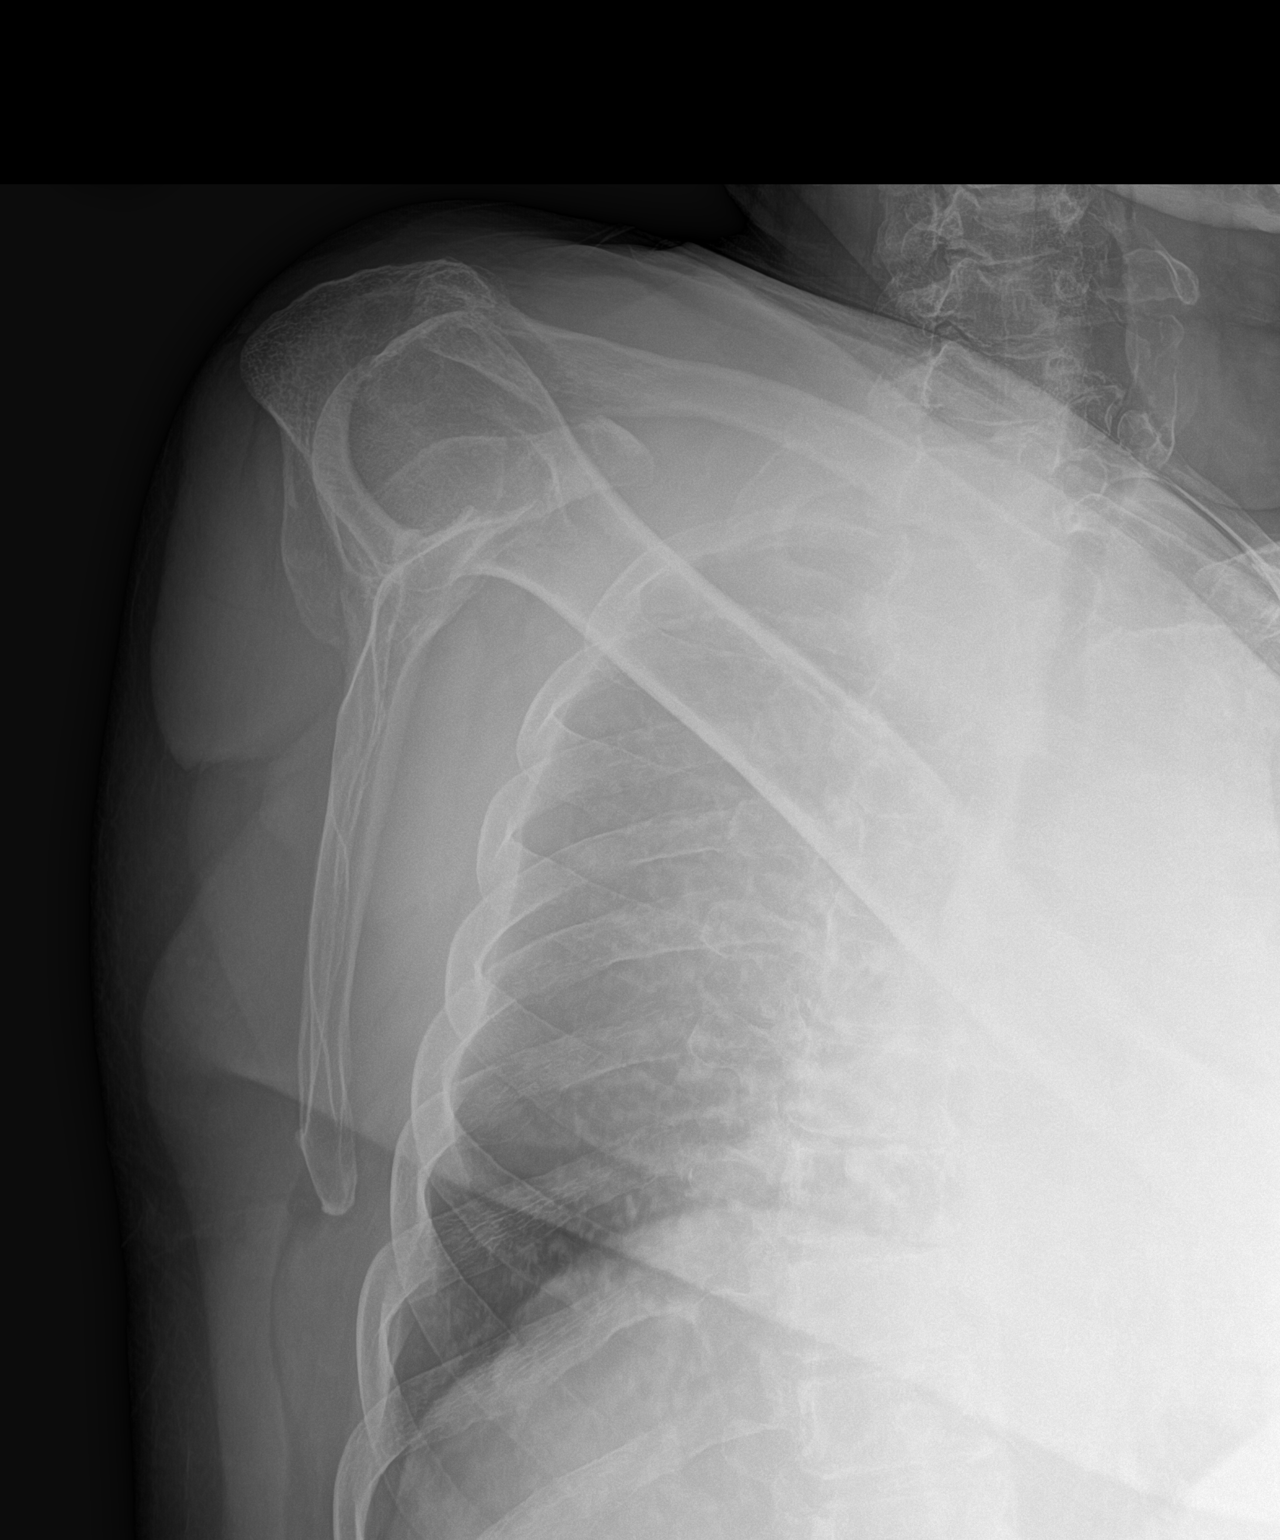

[shoulder axial]
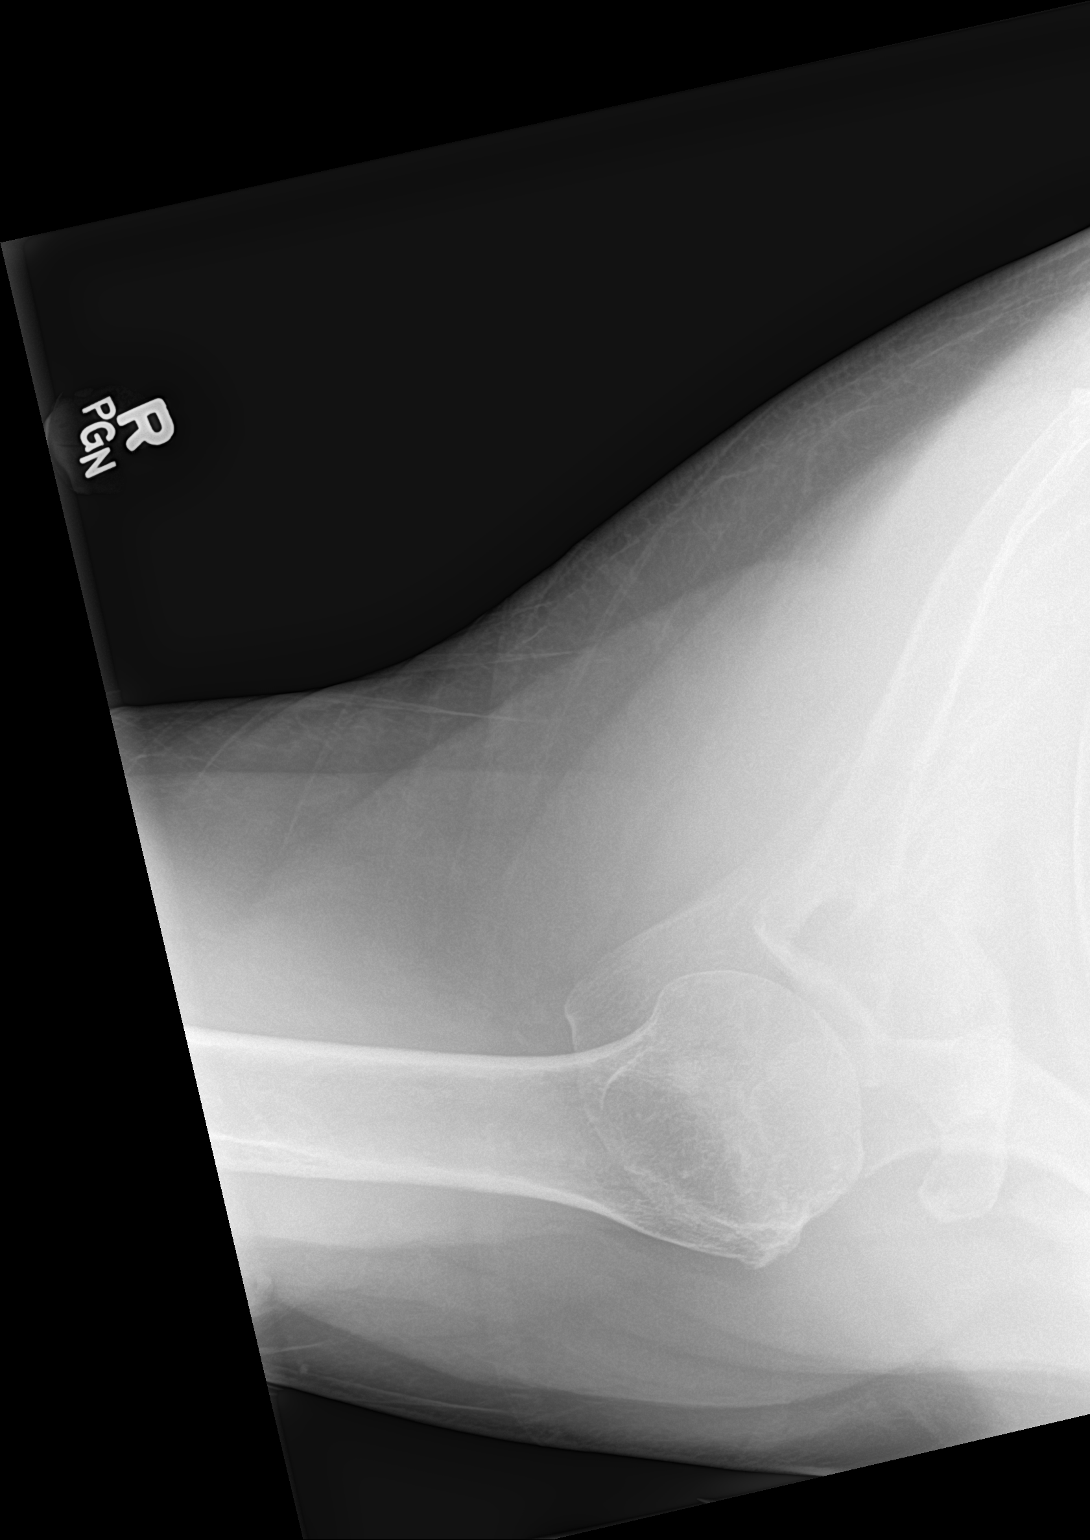

[3 of 3 positions shown; findings below may reference images not displayed]

FINDINGS: There is no evidence of fracture or dislocation. There is no
evidence of arthropathy or other focal bone abnormality. Soft
tissues are unremarkable.
IMPRESSION: Negative.

## 2021-03-29 NOTE — Telephone Encounter (Signed)
For you -

## 2021-03-30 NOTE — Telephone Encounter (Signed)
Can you please advise the patient on that? Thanks!

## 2021-03-30 NOTE — Telephone Encounter (Signed)
Hey there! I didn't check his testosterone this last visit and he will see you right before his visit to endocrinology. Would you mind checking it during his visit with you?  Inez Catalina, please let him know we will check it when he sees Dr. Juleen China if she is ok with that. Thanks

## 2021-03-30 NOTE — Telephone Encounter (Signed)
[facility] is checking if they can add on the lab and will let us know if they cant.

## 2021-03-31 LAB — TESTOSTERONE: Testosterone: 152 ng/dL — ABNORMAL LOW (ref 264–916)

## 2021-03-31 LAB — SPECIMEN STATUS REPORT

## 2021-04-01 NOTE — Progress Notes (Signed)
Just thought you would want to review this.

## 2021-04-14 ENCOUNTER — Other Ambulatory Visit (INDEPENDENT_AMBULATORY_CARE_PROVIDER_SITE_OTHER): Payer: Self-pay | Admitting: Family Medicine

## 2021-04-14 ENCOUNTER — Other Ambulatory Visit: Payer: Self-pay

## 2021-04-14 ENCOUNTER — Encounter (INDEPENDENT_AMBULATORY_CARE_PROVIDER_SITE_OTHER): Payer: Self-pay | Admitting: Family Medicine

## 2021-04-14 ENCOUNTER — Ambulatory Visit (INDEPENDENT_AMBULATORY_CARE_PROVIDER_SITE_OTHER): Payer: BC Managed Care – PPO | Admitting: Family Medicine

## 2021-04-14 VITALS — BP 147/91 | HR 73 | Temp 98.0°F | Ht 66.0 in | Wt 202.0 lb

## 2021-04-14 DIAGNOSIS — Z9189 Other specified personal risk factors, not elsewhere classified: Secondary | ICD-10-CM

## 2021-04-14 DIAGNOSIS — E559 Vitamin D deficiency, unspecified: Secondary | ICD-10-CM | POA: Diagnosis not present

## 2021-04-14 DIAGNOSIS — Z6836 Body mass index (BMI) 36.0-36.9, adult: Secondary | ICD-10-CM

## 2021-04-14 DIAGNOSIS — R7303 Prediabetes: Secondary | ICD-10-CM

## 2021-04-14 DIAGNOSIS — Z6835 Body mass index (BMI) 35.0-35.9, adult: Secondary | ICD-10-CM

## 2021-04-14 DIAGNOSIS — R632 Polyphagia: Secondary | ICD-10-CM

## 2021-04-14 DIAGNOSIS — E785 Hyperlipidemia, unspecified: Secondary | ICD-10-CM | POA: Diagnosis not present

## 2021-04-14 DIAGNOSIS — E291 Testicular hypofunction: Secondary | ICD-10-CM | POA: Diagnosis not present

## 2021-04-14 DIAGNOSIS — G4733 Obstructive sleep apnea (adult) (pediatric): Secondary | ICD-10-CM

## 2021-04-14 DIAGNOSIS — E66812 Obesity, class 2: Secondary | ICD-10-CM

## 2021-04-14 MED ORDER — WEGOVY 1 MG/0.5ML ~~LOC~~ SOAJ: 1.0000 mg | SUBCUTANEOUS | 0 refills | Status: DC

## 2021-04-14 NOTE — Progress Notes (Signed)
Chief Complaint:   OBESITY Lucas Young is here to discuss his progress with his obesity treatment plan along with follow-up of his obesity related diagnoses.   Today's visit was #: 7 Starting weight: 226 lbs Starting date: 12/24/2020 Total lbs lost to date: 24 lbs Total weight loss percentage to date: -10.62%  Interim History: Lucas Young is down 24 pounds today.  He is continuing to focus on low carb foods with lots of fiber.  Averaging 150 grams of protein per day.  He is sleeping 6 hours per night, from 11-5:30.   Nutrition Plan: Category 4 Plan for 95% of the time. Anti-obesity medications: Saxenda. Reported side effects: None. Activity: Walking/strength/yoga for 120 minutes 7 times per week.  Assessment/Plan:   1. Pre-diabetes Not at goal. Goal is HgbA1c < 5.7.  Medication: None.  A1c has improved from 6.1 to 5.8.    Plan:  He will continue to focus on protein-rich, low simple carbohydrate foods. We reviewed the importance of hydration, regular exercise for stress reduction, and restorative sleep.   Lab Results  Component Value Date   HGBA1C 5.8 (H) 03/24/2021   HGBA1C 6.1 (H) 12/24/2020   2. Dyslipidemia Course: Uncontrolled.  Lipid-lowering medications: None.   Plan: Dietary changes: Increase soluble fiber, decrease simple carbohydrates, decrease saturated fat. Exercise changes: Moderate to vigorous-intensity aerobic activity 150 minutes per week or as tolerated. We will continue to monitor along with PCP/specialists as it pertains to his weight loss journey.  Lab Results  Component Value Date   CHOL 162 03/24/2021   HDL 32 (L) 03/24/2021   LDLCALC 109 (H) 03/24/2021   TRIG 116 03/24/2021   CHOLHDL 5.1 (H) 03/24/2021   Lab Results  Component Value Date   ALT 25 03/24/2021   AST 23 03/24/2021   ALKPHOS 72 03/24/2021   BILITOT 0.4 03/24/2021   3. Vitamin D deficiency At goal. Current vitamin D is 75.6, tested on 03/24/2021. Plan: Follow-up for routine  testing of Vitamin D, at least 2-3 times per year to avoid over-replacement.  4. Hypogonadism male Lucas Young is scheduled for an appointment with Dr. Loanne Drilling next week. We will continue to monitor symptoms as they relate to his weight loss journey.  5. OSA (obstructive sleep apnea) He is still not able to get his CPAP machine.  OSA is a cause of systemic hypertension and is associated with an increased incidence of stroke, heart failure, atrial fibrillation, and coronary heart disease. Severe OSA increases all-cause mortality and  cardiovascular mortality.   Goal: Treatment of OSA via CPAP compliance and weight loss. . Plasma ghrelin levels (appetite or "hunger hormone") are significantly higher in OSA patients than in BMI-matched controls, but decrease to levels similar to those of obese patients without OSA after CPAP treatment.  . Weight loss improves OSA by several mechanisms, including reduction in fatty tissue in the throat (i.e. parapharyngeal fat) and the tongue. Loss of abdominal fat increases mediastinal traction on the upper airway making it less likely to collapse during sleep. . Studies have also shown that compliance with CPAP treatment improves leptin (hunger inhibitory hormone) imbalance.  6. Polyphagia Current treatment: Saxenda at maximum dose. He will continue to focus on protein-rich, low simple carbohydrate foods. We reviewed the importance of hydration, regular exercise for stress reduction, and restorative sleep. Plan:  Start Wegovy 1 mg subcutaneously weekly, as per below.  - Start Semaglutide-Weight Management (WEGOVY) 1 MG/0.5ML SOAJ; Inject 1 mg into the skin once a week.  Dispense: 2 mL;  Refill: 0  7. At risk for impaired metabolic function Due to Koron's current state of health and medical condition(s), he is at a significantly higher risk for impaired metabolic function.   At least 8 minutes was spent on counseling [name] about these concerns today.   This places the patient at a much greater risk to subsequently develop cardio-pulmonary conditions that can negatively affect the patient's quality of life.  I stressed the importance of reversing these risks factors.  8. Obesity, current BMI 32.6  Course: Lucas Young is currently in the action stage of change. As such, his goal is to continue with weight loss efforts.   Nutrition goals: He has agreed to the Category 4 Plan.   Exercise goals: For substantial health benefits, adults should do at least 150 minutes (2 hours and 30 minutes) a week of moderate-intensity, or 75 minutes (1 hour and 15 minutes) a week of vigorous-intensity aerobic physical activity, or an equivalent combination of moderate- and vigorous-intensity aerobic activity. Aerobic activity should be performed in episodes of at least 10 minutes, and preferably, it should be spread throughout the week.  Behavioral modification strategies: increasing lean protein intake, decreasing simple carbohydrates, increasing vegetables and increasing water intake.  Lucas Young has agreed to follow-up with our clinic in 4 weeks. He was informed of the importance of frequent follow-up visits to maximize his success with intensive lifestyle modifications for his multiple health conditions.   Objective:   Blood pressure (!) 161/92, pulse 73, temperature 98 F (36.7 C), temperature source Oral, height 5\' 6"  (1.676 m), weight 202 lb (91.6 kg), SpO2 95 %. Body mass index is 32.6 kg/m.  General: Cooperative, alert, well developed, in no acute distress. HEENT: Conjunctivae and lids unremarkable. Cardiovascular: Regular rhythm.  Lungs: Normal work of breathing. Neurologic: No focal deficits.   Lab Results  Component Value Date   CREATININE 0.80 03/24/2021   BUN 19 03/24/2021   NA 141 03/24/2021   K 4.0 03/24/2021   CL 100 03/24/2021   CO2 21 03/24/2021   Lab Results  Component Value Date   ALT 25 03/24/2021   AST 23 03/24/2021    ALKPHOS 72 03/24/2021   BILITOT 0.4 03/24/2021   Lab Results  Component Value Date   HGBA1C 5.8 (H) 03/24/2021   HGBA1C 6.1 (H) 12/24/2020   Lab Results  Component Value Date   INSULIN 14.9 03/24/2021   INSULIN 14.6 12/24/2020   Lab Results  Component Value Date   TSH 3.790 12/24/2020   Lab Results  Component Value Date   CHOL 162 03/24/2021   HDL 32 (L) 03/24/2021   LDLCALC 109 (H) 03/24/2021   TRIG 116 03/24/2021   CHOLHDL 5.1 (H) 03/24/2021   Lab Results  Component Value Date   WBC 6.9 12/24/2020   HGB 15.4 12/24/2020   HCT 46.0 12/24/2020   MCV 87 12/24/2020   PLT 282 12/24/2020   Lab Results  Component Value Date   IRON 134 12/24/2020   TIBC 306 12/24/2020   FERRITIN 140 12/24/2020   Attestation Statements:   Reviewed by clinician on day of visit: allergies, medications, problem list, medical history, surgical history, family history, social history, and previous encounter notes.  I, Water quality scientist, CMA, am acting as transcriptionist for Briscoe Deutscher, DO  I have reviewed the above documentation for accuracy and completeness, and I agree with the above. Briscoe Deutscher, DO

## 2021-04-14 NOTE — Telephone Encounter (Signed)
Pt last seen by Dr. Wallace.  

## 2021-04-15 MED ORDER — INSULIN PEN NEEDLE 32G X 4 MM MISC: 0 refills | Status: DC

## 2021-04-15 NOTE — Telephone Encounter (Signed)
Dr.Wallace °

## 2021-04-19 ENCOUNTER — Encounter: Payer: Self-pay | Admitting: Endocrinology

## 2021-04-19 ENCOUNTER — Other Ambulatory Visit: Payer: Self-pay

## 2021-04-19 ENCOUNTER — Ambulatory Visit: Payer: BC Managed Care – PPO | Admitting: Endocrinology

## 2021-04-19 VITALS — BP 194/110 | HR 69 | Ht 66.0 in

## 2021-04-19 DIAGNOSIS — R7989 Other specified abnormal findings of blood chemistry: Secondary | ICD-10-CM

## 2021-04-19 NOTE — Patient Instructions (Addendum)
Blood tests are requested for you today.  We'll let you know about the results.  Based on the results, you may need an MRI of the pituitary.  Then I would hope to prescribe for you a pill to increase the testosterone.  I would be happy to check the bone density if you want.   Testosterone treatment has risks, including increased or decreased fertility (depending on the type of treatment), hair loss, prostate cancer, benign prostate enlargement, blood clots, liver problems, lower hdl ("good cholesterol"), polycythemia (opposite of anemia), sleep apnea, and behavior changes.   Please come back for a follow-up appointment in 1 year.

## 2021-04-19 NOTE — Progress Notes (Signed)
Subjective:    Patient ID: Lucas Young, male    DOB: [birth year], 65 y.o.   MRN: [id number]  HPI Pt is referred by Dr Juleen China, for low testosterone.  Pt reports he had puberty at the normal age.  He has 2 biological children.  He says he has never taken illicit androgens.  He has never had pituitary imaging. He has never been on any prescribed medication for hypogonadism.  He does not take antiandrogens or opioids.  He denies any h/o infertility, XRT, or genital infection.  He has never had surgery, or a serious injury to the head or genital area. He has no h/o DVT.  No recent EtOH.  He has lost 24 lbs x 3 mos--intentional.  pt states he feels well in general.   Past Medical History:  Diagnosis Date  . Allergy   . Anemia   . Anxiety   . Arthritis   . Back pain   . Depression   . Food allergy   . Hyperlipidemia   . Hypertension   . Lactose intolerance   . Morton's neuroma   . Obesity   . Sleep apnea     Past Surgical History:  Procedure Laterality Date  . APPENDECTOMY    . UMBILICAL HERNIA REPAIR      Social History   Socioeconomic History  . Marital status: Married    Spouse name: Not on file  . Number of children: Not on file  . Years of education: Not on file  . Highest education level: Not on file  Occupational History  . Occupation: Professor  Tobacco Use  . Smoking status: Never Smoker  . Smokeless tobacco: Never Used  Substance and Sexual Activity  . Alcohol use: No    Alcohol/week: 0.0 standard drinks  . Drug use: No  . Sexual activity: Not on file  Other Topics Concern  . Not on file  Social History Narrative  . Not on file   Social Determinants of Health   Financial Resource Strain: Not on file  Food Insecurity: Not on file  Transportation Needs: Not on file  Physical Activity: Not on file  Stress: Not on file  Social Connections: Not on file  Intimate Partner Violence: Not on file    Current Outpatient Medications on File Prior  to Visit  Medication Sig Dispense Refill  . Azelastine HCl 137 MCG/SPRAY SOLN 1-2 puffs in each nostril    . Cholecalciferol 1.25 MG (50000 UT) TABS 50,000 units PO qwk for 12 weeks. 12 tablet 0  . desloratadine (CLARINEX) 5 MG tablet Take 5 mg by mouth daily.    . fluticasone (FLONASE) 50 MCG/ACT nasal spray Place into both nostrils.    . Insulin Pen Needle 32G X 4 MM MISC Daily with Saxenda 150 each 0  . montelukast (SINGULAIR) 10 MG tablet Take 10 mg by mouth at bedtime.    . Multiple Vitamin (MULTIVITAMIN) capsule Take 1 capsule by mouth daily.    . Semaglutide-Weight Management (WEGOVY) 1 MG/0.5ML SOAJ Inject 1 mg into the skin once a week. 2 mL 0  . venlafaxine XR (EFFEXOR-XR) 37.5 MG 24 hr capsule TAKE 1 CAPSULE ONCE DAILY WITH BREAKFAST. 90 capsule 0   No current facility-administered medications on file prior to visit.    No Known Allergies  Family History  Problem Relation Age of Onset  . Hypertension Mother   . Sleep apnea Mother   . Obesity Mother   . Hypertension Father   .  Hyperlipidemia Father   . Heart disease Father   . Sleep apnea Father   . Obesity Father   . Hypertension Maternal Grandmother   . Hypertension Maternal Grandfather   . Other Neg Hx        low testosterone    BP (!) 194/110 (BP Location: Right Arm, Patient Position: Sitting, Cuff Size: Large)   Pulse 69   Ht 5\' 6"  (1.676 m)   SpO2 95%   BMI 32.60 kg/m     Review of Systems denies depression, numbness, erectile dysfunction, gynecomastia, muscle weakness, headache, and sob.      Objective:   Physical Exam VS: see vs page GEN: no distress HEAD: head: no deformity eyes: no periorbital swelling, no proptosis external nose and ears are normal NECK: supple, thyroid is not enlarged CHEST WALL: no deformity LUNGS: clear to auscultation BREASTS:  No gynecomastia CV: reg rate and rhythm, no murmur.  GENITALIA:  Normal male.   MUSCULOSKELETAL: muscle bulk and strength are grossly normal.   no joint swelling is seen  gait is normal and steady.   EXTEMITIES: no leg edema NEURO: sensation is intact to touch on all 4's.   SKIN:  Normal texture and temperature.  No rash or suspicious lesion is visible.  Normal male hair distribution.   NODES:  None palpable at the neck.   PSYCH: alert, well-oriented.  Does not appear anxious nor depressed.     Lab Results  Component Value Date   HGBA1C 5.8 (H) 03/24/2021   I have reviewed outside records, and summarized: Pt was noted to have low testosterone, and referred here.  It did not improve with weight loss     Assessment & Plan:  Low total testosterone, new to me, uncertain etiology and prognosis  Patient Instructions  Blood tests are requested for you today.  We'll let you know about the results.  Based on the results, you may need an MRI of the pituitary.  Then I would hope to prescribe for you a pill to increase the testosterone.  I would be happy to check the bone density if you want.   Testosterone treatment has risks, including increased or decreased fertility (depending on the type of treatment), hair loss, prostate cancer, benign prostate enlargement, blood clots, liver problems, lower hdl ("good cholesterol"), polycythemia (opposite of anemia), sleep apnea, and behavior changes.   Please come back for a follow-up appointment in 1 year.

## 2021-04-20 ENCOUNTER — Other Ambulatory Visit: Payer: Self-pay | Admitting: Endocrinology

## 2021-04-20 ENCOUNTER — Encounter: Payer: Self-pay | Admitting: Family Medicine

## 2021-04-20 ENCOUNTER — Encounter: Payer: Self-pay | Admitting: Endocrinology

## 2021-04-20 DIAGNOSIS — D352 Benign neoplasm of pituitary gland: Secondary | ICD-10-CM

## 2021-04-20 DIAGNOSIS — D443 Neoplasm of uncertain behavior of pituitary gland: Secondary | ICD-10-CM

## 2021-04-20 LAB — BASIC METABOLIC PANEL
BUN: 26 mg/dL — ABNORMAL HIGH (ref 6–23)
CO2: 27 mEq/L (ref 19–32)
Calcium: 9.4 mg/dL (ref 8.4–10.5)
Chloride: 100 mEq/L (ref 96–112)
Creatinine, Ser: 0.94 mg/dL (ref 0.40–1.50)
GFR: 86.19 mL/min (ref >60.00)
Glucose, Bld: 93 mg/dL (ref 70–99)
Potassium: 3.9 mEq/L (ref 3.5–5.1)
Sodium: 140 mEq/L (ref 135–145)

## 2021-04-20 LAB — PROLACTIN: Prolactin: 117.5 ng/mL — ABNORMAL HIGH (ref 2.0–18.0)

## 2021-04-20 LAB — FOLLICLE STIMULATING HORMONE: FSH: 7 m[IU]/mL (ref 1.4–18.1)

## 2021-04-20 LAB — LUTEINIZING HORMONE: LH: 3.56 m[IU]/mL (ref 1.50–9.30)

## 2021-04-20 LAB — TESTOSTERONE,FREE AND TOTAL
Testosterone, Free: 2.3 pg/mL — ABNORMAL LOW (ref 6.6–18.1)
Testosterone: 101 ng/dL — ABNORMAL LOW (ref 264–916)

## 2021-04-21 ENCOUNTER — Encounter (INDEPENDENT_AMBULATORY_CARE_PROVIDER_SITE_OTHER): Payer: Self-pay

## 2021-04-27 NOTE — Progress Notes (Signed)
[place] Phone: [phone] Subjective:   Lucas Lucas [name], am serving as a scribe for Dr. Hulan Saas. This visit occurred during the SARS-CoV-2 public health emergency.  Safety protocols were in place, including screening questions prior to the visit, additional usage of staff PPE, and extensive cleaning of exam room while observing appropriate contact time as indicated for disinfecting solutions.   I'm seeing this patient by the request  of:  Vivi Barrack, MD  CC: Neck pain and back pain follow-up  [id number]  Lucas Lucas [name] is a 65 y.o. male coming in with complaint of back and neck pain. OMT 03/09/2021. Patient states that his neck has been bothering him up until today. Stiffness in upper back and pain at beginning thoracic spine.   Medications patient has been prescribed: Effexor  Taking: Yes         Reviewed prior external information including notes and imaging from previsou exam, outside providers and external EMR if available.  This includes notes from patient's healthy weight and wellness.  As well as notes that were available from care everywhere and other healthcare systems.  Past medical history, social, surgical and family history all reviewed in electronic medical record.  Lucas [name] pertanent information unless stated regarding to the chief complaint.   Past Medical History:  Diagnosis Date  . Allergy   . Anemia   . Anxiety   . Arthritis   . Back pain   . Depression   . Food allergy   . Hyperlipidemia   . Hypertension   . Lactose intolerance   . Morton's neuroma   . Obesity   . Sleep apnea     Lucas [name] Known Allergies   Review of Systems:  Lucas [name] headache, visual changes, nausea, vomiting, diarrhea, constipation, dizziness, abdominal pain, skin rash, fevers, chills, night sweats, weight loss, swollen lymph nodes,joint swelling, chest pain, shortness of breath, mood changes.  POSITIVE muscle aches, body aches  Objective  Blood pressure (!) 138/102, pulse 75, height 5\' 6"  (1.676 m), weight 202 lb (91.6 kg), SpO2 99 %.   General: Lucas [name] apparent distress alert and oriented x3 mood and affect normal, dressed appropriately.  HEENT: Pupils equal, extraocular movements intact  Respiratory: Patient's speak in full sentences and does not appear short of breath  Cardiovascular: Lucas [name] lower extremity edema, non tender, Lucas [name] erythema  Gait normal with good balance and coordination.  MSK:  Non tender with full range of motion and good stability and symmetric strength and tone of shoulders, elbows, wrist, hip, knee and ankles bilaterally.  Back -low back exam does have a moderate loss of lordosis.  Patient does have some tightness noted in the sacroiliac joint. Neck exam lacks last 5 degrees of flexion but does have good extension.  Mild pain with sidebending.  Tenderness noted in the right parascapular region.  Osteopathic findings   C6 flexed rotated and side bent left T3 extended rotated and side bent right inhaled rib T8 extended rotated and side bent left L2 flexed rotated and side bent right Sacrum right on right       Assessment and Plan:  Bilateral shoulder pain Patient's shoulder pain seems to be more neck related today.  Discussed home exercises and icing regimen.  Discussed which activities to do which wants to avoid.  Patient is in the process of potentially losing weight and encouraged him to continue to do so.  Follow-up with me again in 6 to  8 weeks  SI (sacroiliac) joint dysfunction Stable.  Discussed posture and ergonomics, increase activity slowly.  Follow-up with me again in 6 to 8 weeks    Nonallopathic problems  Decision today to treat with OMT was based on Physical Exam  After verbal consent patient was treated with HVLA, ME, FPR techniques in cervical, rib, thoracic, lumbar, and sacral  areas  Patient tolerated the procedure well with  improvement in symptoms  Patient given exercises, stretches and lifestyle modifications  See medications in patient instructions if given  Patient will follow up in 4-8 weeks      The above documentation has been reviewed and is accurate and complete [name], DO       Note: This dictation was prepared with Dragon dictation along with smaller phrase technology. Any transcriptional errors that result from this process are unintentional.

## 2021-04-28 ENCOUNTER — Other Ambulatory Visit: Payer: Self-pay

## 2021-04-28 ENCOUNTER — Ambulatory Visit: Payer: BC Managed Care – PPO | Admitting: Family Medicine

## 2021-04-28 ENCOUNTER — Encounter: Payer: Self-pay | Admitting: Family Medicine

## 2021-04-28 ENCOUNTER — Encounter: Payer: Self-pay | Admitting: Endocrinology

## 2021-04-28 ENCOUNTER — Other Ambulatory Visit: Payer: Self-pay | Admitting: Family Medicine

## 2021-04-28 VITALS — BP 138/102 | HR 75 | Ht 66.0 in | Wt 202.0 lb

## 2021-04-28 DIAGNOSIS — M9901 Segmental and somatic dysfunction of cervical region: Secondary | ICD-10-CM

## 2021-04-28 DIAGNOSIS — M9904 Segmental and somatic dysfunction of sacral region: Secondary | ICD-10-CM

## 2021-04-28 DIAGNOSIS — M533 Sacrococcygeal disorders, not elsewhere classified: Secondary | ICD-10-CM

## 2021-04-28 DIAGNOSIS — M25511 Pain in right shoulder: Secondary | ICD-10-CM

## 2021-04-28 DIAGNOSIS — M9903 Segmental and somatic dysfunction of lumbar region: Secondary | ICD-10-CM

## 2021-04-28 DIAGNOSIS — M9908 Segmental and somatic dysfunction of rib cage: Secondary | ICD-10-CM | POA: Diagnosis not present

## 2021-04-28 DIAGNOSIS — M9902 Segmental and somatic dysfunction of thoracic region: Secondary | ICD-10-CM | POA: Diagnosis not present

## 2021-04-28 DIAGNOSIS — E669 Obesity, unspecified: Secondary | ICD-10-CM

## 2021-04-28 DIAGNOSIS — M25512 Pain in left shoulder: Secondary | ICD-10-CM

## 2021-04-28 NOTE — Assessment & Plan Note (Signed)
Stable.  Discussed posture and ergonomics, increase activity slowly.  Follow-up with me again in 6 to 8 weeks

## 2021-04-28 NOTE — Assessment & Plan Note (Signed)
Patient's shoulder pain seems to be more neck related today.  Discussed home exercises and icing regimen.  Discussed which activities to do which wants to avoid.  Patient is in the process of potentially losing weight and encouraged him to continue to do so.  Follow-up with me again in 6 to 8 weeks

## 2021-04-28 NOTE — Patient Instructions (Signed)
Stretch shoulders when feeling tight Keep working on weight and CPAP Stay active See me in 6-8 weeks

## 2021-04-28 NOTE — Assessment & Plan Note (Signed)
Encourage weight loss continue to work with healthy weight and wellness

## 2021-05-05 ENCOUNTER — Encounter (INDEPENDENT_AMBULATORY_CARE_PROVIDER_SITE_OTHER): Payer: Self-pay | Admitting: Family Medicine

## 2021-05-05 ENCOUNTER — Encounter: Payer: Self-pay | Admitting: Family Medicine

## 2021-05-05 NOTE — Telephone Encounter (Signed)
Spoke with patient, stated has fatigue and sore throat no other symptoms is Symptoms worsen please give [place] a call Advise if [facility] go to ED verbalized understanding

## 2021-05-08 ENCOUNTER — Other Ambulatory Visit: Payer: BC Managed Care – PPO

## 2021-05-13 ENCOUNTER — Other Ambulatory Visit (INDEPENDENT_AMBULATORY_CARE_PROVIDER_SITE_OTHER): Payer: Self-pay | Admitting: Family Medicine

## 2021-05-13 DIAGNOSIS — R632 Polyphagia: Secondary | ICD-10-CM

## 2021-05-13 NOTE — Telephone Encounter (Signed)
Dr.Wallace °

## 2021-05-16 ENCOUNTER — Ambulatory Visit
Admission: RE | Admit: 2021-05-16 | Discharge: 2021-05-16 | Disposition: A | Discharge status: Home / Self Care | Payer: BC Managed Care – PPO | Source: Ambulatory Visit | Attending: Endocrinology | Admitting: Endocrinology

## 2021-05-16 ENCOUNTER — Other Ambulatory Visit: Payer: Self-pay

## 2021-05-16 DIAGNOSIS — D352 Benign neoplasm of pituitary gland: Secondary | ICD-10-CM

## 2021-05-16 MED ORDER — GADOBENATE DIMEGLUMINE 529 MG/ML IV SOLN
10.0000 mL | Freq: Once | INTRAVENOUS | Status: AC | PRN
  Administered 2021-05-16: 10 mL via INTRAVENOUS

## 2021-05-17 ENCOUNTER — Encounter: Payer: Self-pay | Admitting: Endocrinology

## 2021-05-17 ENCOUNTER — Other Ambulatory Visit: Payer: Self-pay

## 2021-05-17 ENCOUNTER — Other Ambulatory Visit: Payer: Self-pay | Admitting: Endocrinology

## 2021-05-17 ENCOUNTER — Ambulatory Visit (INDEPENDENT_AMBULATORY_CARE_PROVIDER_SITE_OTHER): Payer: BC Managed Care – PPO | Admitting: Family Medicine

## 2021-05-17 ENCOUNTER — Encounter (INDEPENDENT_AMBULATORY_CARE_PROVIDER_SITE_OTHER): Payer: Self-pay | Admitting: Family Medicine

## 2021-05-17 VITALS — BP 169/93 | HR 68 | Temp 97.9°F | Ht 66.0 in | Wt 201.0 lb

## 2021-05-17 DIAGNOSIS — R7989 Other specified abnormal findings of blood chemistry: Secondary | ICD-10-CM | POA: Diagnosis not present

## 2021-05-17 DIAGNOSIS — Z9189 Other specified personal risk factors, not elsewhere classified: Secondary | ICD-10-CM

## 2021-05-17 DIAGNOSIS — D352 Benign neoplasm of pituitary gland: Secondary | ICD-10-CM

## 2021-05-17 DIAGNOSIS — R632 Polyphagia: Secondary | ICD-10-CM | POA: Diagnosis not present

## 2021-05-17 DIAGNOSIS — K5903 Drug induced constipation: Secondary | ICD-10-CM | POA: Diagnosis not present

## 2021-05-17 DIAGNOSIS — G4733 Obstructive sleep apnea (adult) (pediatric): Secondary | ICD-10-CM

## 2021-05-17 DIAGNOSIS — Z6836 Body mass index (BMI) 36.0-36.9, adult: Secondary | ICD-10-CM

## 2021-05-17 DIAGNOSIS — Z9989 Dependence on other enabling machines and devices: Secondary | ICD-10-CM

## 2021-05-17 MED ORDER — BROMOCRIPTINE MESYLATE 2.5 MG PO TABS: 2.5000 mg | ORAL_TABLET | Freq: Every day | ORAL | 3 refills | Status: DC

## 2021-05-17 MED ORDER — WEGOVY 1.7 MG/0.75ML ~~LOC~~ SOAJ: 1.7000 mg | SUBCUTANEOUS | 0 refills | Status: DC

## 2021-05-25 NOTE — Progress Notes (Signed)
Chief Complaint:   OBESITY Lucas Young is here to discuss his progress with his obesity treatment plan along with follow-up of his obesity related diagnoses.   Today's visit was #: 8 Starting weight: 226 lbs Starting date: 12/24/2020 Today's weight: 201 lbs Today's date: 05/17/2021 Weight change since last visit: 1 lb Total lbs lost to date: 25 lbs Body mass index is 32.44 kg/m.  Total weight loss percentage to date: -11.06%  Interim History:  Lucas Young had an MRI yesterday.  He says he is using his CPAP now.  Started using it May 31st.  He says he is disappointed with his lack of weight loss.  Current Meal Plan: the Category 4 Plan for 100% of the time.  Current Exercise Plan: Strength training/cardio/walking for 120 minutes 7 times per week. Current Anti-Obesity Medications: Wegovy 1.7 mg subcutaneously weekly. Side effects: Nond.  Assessment/Plan:   1. Drug induced constipation This problem is not well controlled. Plan:  He will add MiraLAX to his bowel regimen.  2. Polyphagia Not at goal. Current treatment: Wegovy 1 mg subcutaneously weekly. Polyphagia refers to excessive feelings of hunger.   Plan:  Increase Wegovy to 1.7 mg subcutaneously weekly.  He will continue to focus on protein-rich, low simple carbohydrate foods. We reviewed the importance of hydration, regular exercise for stress reduction, and restorative sleep.  - Increase and refill Semaglutide-Weight Management (WEGOVY) 1.7 MG/0.75ML SOAJ; Inject 1 mg into the skin once a week.  Dispense: 3 mL; Refill: 0  3. Low testosterone I have reviewed his visit with Dr. Loanne Drilling. We will continue to monitor symptoms as they relate to his weight loss journey.  4. High serum prolactin His MRI yesterday still has not been read. We will continue to monitor symptoms as they relate to his weight loss journey.  5. OSA on CPAP OSA is a cause of systemic hypertension and is associated with an increased incidence of  stroke, heart failure, atrial fibrillation, and coronary heart disease. Severe OSA increases all-cause mortality and cardiovascular mortality.   Goal: Treatment of OSA via CPAP compliance and weight loss. Plasma ghrelin levels (appetite or "hunger hormone") are significantly higher in OSA patients than in BMI-matched controls, but decrease to levels similar to those of obese patients without OSA after CPAP treatment.  Weight loss improves OSA by several mechanisms, including reduction in fatty tissue in the throat (i.e. parapharyngeal fat) and the tongue. Loss of abdominal fat increases mediastinal traction on the upper airway making it less likely to collapse during sleep. Studies have also shown that compliance with CPAP treatment improves leptin (hunger inhibitory hormone) imbalance.  7. Obesity, current BMI 32.6  Course: Lucas Young is currently in the action stage of change. As such, his goal is to continue with weight loss efforts.   Nutrition goals: He has agreed to the Category 4 Plan.   Exercise goals: For substantial health benefits, adults should do at least 150 minutes (2 hours and 30 minutes) a week of moderate-intensity, or 75 minutes (1 hour and 15 minutes) a week of vigorous-intensity aerobic physical activity, or an equivalent combination of moderate- and vigorous-intensity aerobic activity. Aerobic activity should be performed in episodes of at least 10 minutes, and preferably, it should be spread throughout the week.  Behavioral modification strategies: increasing lean protein intake, decreasing simple carbohydrates, increasing vegetables, and increasing water intake.  Lucas Young has agreed to follow-up with our clinic in 4 weeks. He was informed of the importance of frequent follow-up visits to maximize his  success with intensive lifestyle modifications for his multiple health conditions.   Objective:   Blood pressure (!) 169/93, pulse 68, temperature 97.9 F (36.6 C),  temperature source Oral, height 5\' 6"  (1.676 m), weight 201 lb (91.2 kg), SpO2 97 %. Body mass index is 32.44 kg/m.  General: Cooperative, alert, well developed, in no acute distress. HEENT: Conjunctivae and lids unremarkable. Cardiovascular: Regular rhythm.  Lungs: Normal work of breathing. Neurologic: No focal deficits.   Lab Results  Component Value Date   CREATININE 0.94 04/19/2021   BUN 26 (H) 04/19/2021   NA 140 04/19/2021   K 3.9 04/19/2021   CL 100 04/19/2021   CO2 27 04/19/2021   Lab Results  Component Value Date   ALT 25 03/24/2021   AST 23 03/24/2021   ALKPHOS 72 03/24/2021   BILITOT 0.4 03/24/2021   Lab Results  Component Value Date   HGBA1C 5.8 (H) 03/24/2021   HGBA1C 6.1 (H) 12/24/2020   Lab Results  Component Value Date   INSULIN 14.9 03/24/2021   INSULIN 14.6 12/24/2020   Lab Results  Component Value Date   TSH 3.790 12/24/2020   Lab Results  Component Value Date   CHOL 162 03/24/2021   HDL 32 (L) 03/24/2021   LDLCALC 109 (H) 03/24/2021   TRIG 116 03/24/2021   CHOLHDL 5.1 (H) 03/24/2021   Lab Results  Component Value Date   WBC 6.9 12/24/2020   HGB 15.4 12/24/2020   HCT 46.0 12/24/2020   MCV 87 12/24/2020   PLT 282 12/24/2020   Lab Results  Component Value Date   IRON 134 12/24/2020   TIBC 306 12/24/2020   FERRITIN 140 12/24/2020   Attestation Statements:   Reviewed by clinician on day of visit: allergies, medications, problem list, medical history, surgical history, family history, social history, and previous encounter notes.  I, Water quality scientist, CMA, am acting as transcriptionist for Briscoe Deutscher, DO  I have reviewed the above documentation for accuracy and completeness, and I agree with the above. Briscoe Deutscher, DO

## 2021-06-01 ENCOUNTER — Other Ambulatory Visit: Payer: Self-pay | Admitting: Endocrinology

## 2021-06-01 DIAGNOSIS — Z125 Encounter for screening for malignant neoplasm of prostate: Secondary | ICD-10-CM | POA: Insufficient documentation

## 2021-06-01 DIAGNOSIS — R739 Hyperglycemia, unspecified: Secondary | ICD-10-CM

## 2021-06-07 ENCOUNTER — Ambulatory Visit (INDEPENDENT_AMBULATORY_CARE_PROVIDER_SITE_OTHER): Payer: BC Managed Care – PPO | Admitting: Family Medicine

## 2021-06-10 ENCOUNTER — Encounter (INDEPENDENT_AMBULATORY_CARE_PROVIDER_SITE_OTHER): Payer: Self-pay | Admitting: Family Medicine

## 2021-06-10 MED ORDER — WEGOVY 2.4 MG/0.75ML ~~LOC~~ SOAJ
2.4000 mg | SUBCUTANEOUS | 0 refills | Status: DC
Start: 2021-06-10 — End: 2021-07-05

## 2021-06-10 NOTE — Telephone Encounter (Signed)
Last OV with Dr Wallace 

## 2021-06-11 ENCOUNTER — Telehealth: Payer: Self-pay

## 2021-06-11 NOTE — Telephone Encounter (Signed)
Left pt a VM requesting he call [facility] and get himself scheduled since [facility] is short staffed- to verify that patient has been seen cll [phone]

## 2021-06-14 NOTE — Progress Notes (Signed)
[place] [address] [place] Phone: [phone] Subjective:   I Lucas Young am serving as a Education administrator for Dr. Hulan Saas.  This visit occurred during the SARS-CoV-2 public health emergency.  Safety protocols were in place, including screening questions prior to the visit, additional usage of staff PPE, and extensive cleaning of exam room while observing appropriate contact time as indicated for disinfecting solutions.   I'm seeing this patient by the request  of:  Vivi Barrack, MD  CC: Low back pain follow-up  [id number]  Lucas Young is a 65 y.o. male coming in with complaint of back and neck pain. OMT 04/28/2021. Patient states states his lower back started bothering him last week. States it is mainly on the right side. States he fell into a hornets jacket nest and broke up a dog fight and believe that is what caused the back spasm.  Patient states that since the incident started having increasing discomfort and pain.  Medications patient has been prescribed: Effexor  Taking: Yes        Past Medical History:  Diagnosis Date   Allergy    Anemia    Anxiety    Arthritis    Back pain    Depression    Food allergy    Hyperlipidemia    Hypertension    Lactose intolerance    Morton's neuroma    Obesity    Sleep apnea     No Known Allergies   Review of Systems:  No headache, visual changes, nausea, vomiting, diarrhea, constipation, dizziness, abdominal pain, skin rash, fevers, chills, night sweats, weight loss, swollen lymph nodes, body aches, joint swelling, chest pain, shortness of breath, mood changes. POSITIVE muscle aches  Objective  Blood pressure (!) 146/90, pulse 71, height 5\' 6"  (1.676 m), SpO2 97 %.   General: No apparent distress alert and oriented x3 mood and affect normal, dressed appropriately.  HEENT: Pupils equal, extraocular movements intact  Respiratory: Patient's speak in full  sentences and does not appear short of breath  Cardiovascular: No lower extremity edema, non tender, no erythema  Low back exam does have some mild loss of lordosis.  Tightness noted in the thoracolumbar juncture as well as in the lumbosacral area.  Mild worsening pain in patient's baseline.  Neurovascularly intact distally.  Neck exam does show some decrease in sidebending bilaterally.  Osteopathic findings  C2 flexed rotated and side bent right C6 flexed rotated and side bent left T3 extended rotated and side bent right inhaled rib T9 extended rotated and side bent left L2 flexed rotated and side bent right Sacrum right on right       Assessment and Plan:  Lumbar back pain Low back pain exacerbation after falling.  Patient is in multiple injuries at 1 time in the last week that likely contributed to some of the exacerbation.  Continue the Effexor.  Discussed icing regimen and home exercises.  Discussed core strengthening and follow-up again in 6 weeks   Nonallopathic problems  Decision today to treat with OMT was based on Physical Exam  After verbal consent patient was treated with HVLA, ME, FPR techniques in cervical, rib, thoracic, lumbar, and sacral  areas  Patient tolerated the procedure well with improvement in symptoms  Patient given exercises, stretches and lifestyle modifications  See medications in patient instructions if given  Patient will follow up in 4-8 weeks      The above documentation has been reviewed  and is accurate and complete [name], DO       Note: This dictation was prepared with Dragon dictation along with smaller phrase technology. Any transcriptional errors that result from this process are unintentional.

## 2021-06-16 ENCOUNTER — Encounter: Payer: Self-pay | Admitting: Family Medicine

## 2021-06-16 ENCOUNTER — Other Ambulatory Visit: Payer: Self-pay

## 2021-06-16 ENCOUNTER — Ambulatory Visit: Payer: BC Managed Care – PPO | Admitting: Family Medicine

## 2021-06-16 VITALS — BP 146/90 | HR 71 | Ht 66.0 in

## 2021-06-16 DIAGNOSIS — M9908 Segmental and somatic dysfunction of rib cage: Secondary | ICD-10-CM | POA: Diagnosis not present

## 2021-06-16 DIAGNOSIS — M9902 Segmental and somatic dysfunction of thoracic region: Secondary | ICD-10-CM

## 2021-06-16 DIAGNOSIS — M9901 Segmental and somatic dysfunction of cervical region: Secondary | ICD-10-CM | POA: Diagnosis not present

## 2021-06-16 DIAGNOSIS — M9904 Segmental and somatic dysfunction of sacral region: Secondary | ICD-10-CM | POA: Diagnosis not present

## 2021-06-16 DIAGNOSIS — M9903 Segmental and somatic dysfunction of lumbar region: Secondary | ICD-10-CM | POA: Diagnosis not present

## 2021-06-16 DIAGNOSIS — M545 Low back pain, unspecified: Secondary | ICD-10-CM

## 2021-06-16 NOTE — Patient Instructions (Addendum)
Good to see you Look into a dog walker See me again in 5- 6 weeks

## 2021-06-16 NOTE — Assessment & Plan Note (Signed)
Low back pain exacerbation after falling.  Patient is in multiple injuries at 1 time in the last week that likely contributed to some of the exacerbation.  Continue the Effexor.  Discussed icing regimen and home exercises.  Discussed core strengthening and follow-up again in 6 weeks

## 2021-06-17 ENCOUNTER — Encounter (INDEPENDENT_AMBULATORY_CARE_PROVIDER_SITE_OTHER): Payer: Self-pay | Admitting: Family Medicine

## 2021-06-18 ENCOUNTER — Encounter: Payer: Self-pay | Admitting: Family Medicine

## 2021-06-21 ENCOUNTER — Encounter: Payer: Self-pay | Admitting: Gastroenterology

## 2021-06-22 ENCOUNTER — Other Ambulatory Visit (INDEPENDENT_AMBULATORY_CARE_PROVIDER_SITE_OTHER): Payer: BC Managed Care – PPO

## 2021-06-22 ENCOUNTER — Other Ambulatory Visit: Payer: Self-pay

## 2021-06-22 DIAGNOSIS — R739 Hyperglycemia, unspecified: Secondary | ICD-10-CM | POA: Diagnosis not present

## 2021-06-22 DIAGNOSIS — D443 Neoplasm of uncertain behavior of pituitary gland: Secondary | ICD-10-CM

## 2021-06-22 DIAGNOSIS — D352 Benign neoplasm of pituitary gland: Secondary | ICD-10-CM

## 2021-06-22 DIAGNOSIS — Z125 Encounter for screening for malignant neoplasm of prostate: Secondary | ICD-10-CM | POA: Diagnosis not present

## 2021-06-22 LAB — PSA: PSA: 0.25 ng/mL (ref 0.10–4.00)

## 2021-06-22 LAB — HEMOGLOBIN A1C: Hgb A1c MFr Bld: 5.8 % (ref 4.6–6.5)

## 2021-06-23 ENCOUNTER — Encounter (INDEPENDENT_AMBULATORY_CARE_PROVIDER_SITE_OTHER): Payer: Self-pay | Admitting: Family Medicine

## 2021-06-23 ENCOUNTER — Ambulatory Visit (INDEPENDENT_AMBULATORY_CARE_PROVIDER_SITE_OTHER): Payer: BC Managed Care – PPO | Admitting: Family Medicine

## 2021-06-23 ENCOUNTER — Other Ambulatory Visit: Payer: Self-pay

## 2021-06-23 VITALS — BP 155/94 | HR 69 | Temp 98.2°F | Ht 66.0 in | Wt 201.0 lb

## 2021-06-23 DIAGNOSIS — I10 Essential (primary) hypertension: Secondary | ICD-10-CM

## 2021-06-23 DIAGNOSIS — Z9189 Other specified personal risk factors, not elsewhere classified: Secondary | ICD-10-CM | POA: Diagnosis not present

## 2021-06-23 DIAGNOSIS — R7303 Prediabetes: Secondary | ICD-10-CM | POA: Diagnosis not present

## 2021-06-23 DIAGNOSIS — Z6836 Body mass index (BMI) 36.0-36.9, adult: Secondary | ICD-10-CM

## 2021-06-23 DIAGNOSIS — R7989 Other specified abnormal findings of blood chemistry: Secondary | ICD-10-CM | POA: Diagnosis not present

## 2021-06-23 LAB — PROLACTIN: Prolactin: 25.2 ng/mL — ABNORMAL HIGH (ref 2.0–18.0)

## 2021-06-24 ENCOUNTER — Encounter: Payer: Self-pay | Admitting: Endocrinology

## 2021-06-24 MED ORDER — TELMISARTAN 20 MG PO TABS: 20.0000 mg | ORAL_TABLET | Freq: Every day | ORAL | 1 refills | Status: DC

## 2021-06-26 LAB — TESTOSTERONE,FREE AND TOTAL
Testosterone, Free: 2.3 pg/mL — ABNORMAL LOW (ref 6.6–18.1)
Testosterone: 216 ng/dL — ABNORMAL LOW (ref 264–916)

## 2021-06-27 ENCOUNTER — Other Ambulatory Visit: Payer: Self-pay | Admitting: Endocrinology

## 2021-06-27 DIAGNOSIS — D352 Benign neoplasm of pituitary gland: Secondary | ICD-10-CM

## 2021-06-27 DIAGNOSIS — R7989 Other specified abnormal findings of blood chemistry: Secondary | ICD-10-CM

## 2021-06-27 DIAGNOSIS — D443 Neoplasm of uncertain behavior of pituitary gland: Secondary | ICD-10-CM

## 2021-06-27 MED ORDER — BROMOCRIPTINE MESYLATE 5 MG PO CAPS: 5.0000 mg | ORAL_CAPSULE | Freq: Every day | ORAL | 3 refills | Status: DC

## 2021-06-29 NOTE — Progress Notes (Signed)
Chief Complaint:   OBESITY Lucas Young is here to discuss his progress with his obesity treatment plan along with follow-up of his obesity related diagnoses.   Today's visit was #: 9 Starting weight: 226 lbs Starting date: 12/24/2020 Today's weight: 201 lbs Today's date: 06/23/2021 Weight change since last visit: 0 Total lbs lost to date: 25 lbs Body mass index is 32.44 kg/m.  Total weight loss percentage to date: -11.06%  Interim History:  Lucas Young and I reviewed his notes from endocrinology.  He says he is frustrated with the lack of scale movement. Current Meal Plan: the Category 4 Plan for 95% of the time.  Current Exercise Plan: Walking/cardio/yoga/strength training for 60+ minutes 7 times per week. Current Anti-Obesity Medications: Wegovy 2.4 mg subcutaneously weekly. Side effects: None.  Assessment/Plan:   Meds ordered this encounter  Medications   telmisartan (MICARDIS) 20 MG tablet    Sig: Take 1 tablet (20 mg total) by mouth daily.    Dispense:  30 tablet    Refill:  1    1. Pre-diabetes Not optimized. Goal is HgbA1c < 5.7.  Medication: Wegovy 2.4 mg subcutaneously weekly.    Plan:  He will continue to focus on protein-rich, low simple carbohydrate foods. We reviewed the importance of hydration, regular exercise for stress reduction, and restorative sleep.   Lab Results  Component Value Date   HGBA1C 5.8 06/22/2021   Lab Results  Component Value Date   INSULIN 14.9 03/24/2021   INSULIN 14.6 12/24/2020   2. Low testosterone, with hyperprolactinemia and OSA, followed by Endocrinology Will continue to follow as it relates to his weight loss journey.  3. Essential hypertension Not at goal. Medications: None.   Plan:  After discussion, patient would like to start below medication. Expectations, risks, and potential side effects reviewed. Avoid buying foods that are: processed, frozen, or prepackaged to avoid excess salt. We will watch for signs of  hypotension as he continues lifestyle modifications.  BP Readings from Last 3 Encounters:  06/23/21 (!) 155/94  06/16/21 (!) 146/90  05/17/21 (!) 169/93   Lab Results  Component Value Date   CREATININE 0.94 04/19/2021   - Start telmisartan (MICARDIS) 20 MG tablet; Take 1 tablet (20 mg total) by mouth daily.  Dispense: 30 tablet; Refill: 1  4. At risk for heart disease Due to Lucas Young's current state of health and medical condition(s), he is at a higher risk for heart disease.  This puts the patient at much greater risk to subsequently develop cardiopulmonary conditions that can significantly affect patient's quality of life in a negative manner.    At least 8 minutes were spent on counseling Lucas Young about these concerns today. Evidence-based interventions for health behavior change were utilized today including the discussion of self monitoring techniques, problem-solving barriers, and SMART goal setting techniques.  Specifically, regarding patient's less desirable eating habits and patterns, we employed the technique of small changes when Lucas Young has not been able to fully commit to his prudent nutritional plan.  5. Obesity, current BMI 32.5  Course: Lucas Young is currently in the action stage of change. As such, his goal is to continue with weight loss efforts.   Nutrition goals: He has agreed to following a lower carbohydrate, vegetable and lean protein rich diet plan with 70-100 grams of carbohydrates/1500 calories.   Exercise goals:  As is.  Behavioral modification strategies: increasing lean protein intake, decreasing simple carbohydrates, increasing vegetables, increasing water intake, decreasing alcohol intake, decreasing sodium intake, and increasing  high fiber foods.  Lucas Young has agreed to follow-up with our clinic in 4 weeks. He was informed of the importance of frequent follow-up visits to maximize his success with intensive lifestyle modifications for his  multiple health conditions.   Objective:   Blood pressure (!) 155/94, pulse 69, temperature 98.2 F (36.8 C), temperature source Oral, height '5\' 6"'$  (1.676 m), weight 201 lb (91.2 kg), SpO2 94 %. Body mass index is 32.44 kg/m.  General: Cooperative, alert, well developed, in no acute distress. HEENT: Conjunctivae and lids unremarkable. Cardiovascular: Regular rhythm.  Lungs: Normal work of breathing. Neurologic: No focal deficits.   Lab Results  Component Value Date   CREATININE 0.94 04/19/2021   BUN 26 (H) 04/19/2021   NA 140 04/19/2021   K 3.9 04/19/2021   CL 100 04/19/2021   CO2 27 04/19/2021   Lab Results  Component Value Date   ALT 25 03/24/2021   AST 23 03/24/2021   ALKPHOS 72 03/24/2021   BILITOT 0.4 03/24/2021   Lab Results  Component Value Date   HGBA1C 5.8 06/22/2021   HGBA1C 5.8 (H) 03/24/2021   HGBA1C 6.1 (H) 12/24/2020   Lab Results  Component Value Date   INSULIN 14.9 03/24/2021   INSULIN 14.6 12/24/2020   Lab Results  Component Value Date   TSH 3.790 12/24/2020   Lab Results  Component Value Date   CHOL 162 03/24/2021   HDL 32 (L) 03/24/2021   LDLCALC 109 (H) 03/24/2021   TRIG 116 03/24/2021   CHOLHDL 5.1 (H) 03/24/2021   Lab Results  Component Value Date   VD25OH 75.6 03/24/2021   VD25OH 20.7 (L) 12/24/2020   Lab Results  Component Value Date   WBC 6.9 12/24/2020   HGB 15.4 12/24/2020   HCT 46.0 12/24/2020   MCV 87 12/24/2020   PLT 282 12/24/2020   Lab Results  Component Value Date   IRON 134 12/24/2020   TIBC 306 12/24/2020   FERRITIN 140 12/24/2020   Attestation Statements:   Reviewed by clinician on day of visit: allergies, medications, problem list, medical history, surgical history, family history, social history, and previous encounter notes.  I, Water quality scientist, CMA, am acting as transcriptionist for Briscoe Deutscher, DO  I have reviewed the above documentation for accuracy and completeness, and I agree with the above. Briscoe Deutscher, DO

## 2021-07-04 ENCOUNTER — Encounter (INDEPENDENT_AMBULATORY_CARE_PROVIDER_SITE_OTHER): Payer: Self-pay | Admitting: Family Medicine

## 2021-07-05 ENCOUNTER — Other Ambulatory Visit (INDEPENDENT_AMBULATORY_CARE_PROVIDER_SITE_OTHER): Payer: Self-pay | Admitting: Family Medicine

## 2021-07-05 NOTE — Telephone Encounter (Signed)
Dr.Wallace °

## 2021-07-06 MED ORDER — WEGOVY 2.4 MG/0.75ML ~~LOC~~ SOAJ: 2.4000 mg | SUBCUTANEOUS | 0 refills | Status: DC

## 2021-07-20 NOTE — Progress Notes (Signed)
Lucas Young [place] Phone: [phone] Subjective:   [name], am serving as a scribe for Dr. Hulan Saas.  This visit occurred during the SARS-CoV-2 public health emergency.  Safety protocols were in place, including screening questions prior to the visit, additional usage of staff PPE, and extensive cleaning of exam room while observing appropriate contact time as indicated for disinfecting solutions.    I'm seeing this patient by the request  of:  Vivi Barrack, MD  CC: Back pain follow-up  [id number]  Lucas Young is a 65 y.o. male coming in with complaint of back and neck pain. OMT 06/16/2021. Patient had sent MyChart message about changing from Effexor to another medication. Patient states that his back pain is up and down. Patient is having more upper back pain vs lower back.  Patient was having some difficulty with his blood pressure previously with being too low.  Has discontinued some hypertensive medicine and seems to be doing better.  Has a follow-up tomorrow for this.  Medications patient has been prescribed: None             Past Medical History:  Diagnosis Date   Allergy    Anemia    Anxiety    Arthritis    Back pain    Depression    Food allergy    Hyperlipidemia    Hypertension    Lactose intolerance    Morton's neuroma    Obesity    Sleep apnea     No Known Allergies   Review of Systems:  No headache, visual changes, nausea, vomiting, diarrhea, constipation, dizziness, abdominal pain, skin rash, fevers, chills, night sweats, weight loss, swollen lymph nodes,  joint swelling, chest pain, shortness of breath, mood changes. POSITIVE muscle aches, body aches  Objective  Blood pressure 130/90, pulse 64, height '5\' 6"'$  (1.676 m), SpO2 97 %.   General: No apparent distress alert and oriented x3 mood and affect normal, dressed appropriately.  HEENT: Pupils equal,  extraocular movements intact  Respiratory: Patient's speak in full sentences and does not appear short of breath  Cardiovascular: No lower extremity edema, non tender, no erythema  Low back exam does have some loss of lordosis.  Some tenderness to palpation of the paraspinal musculature.  Tightness noted with Corky Sox bilaterally.  Patient is still working on core strengthening.  Osteopathic findings  C2 flexed rotated and side bent right C7 flexed rotated and side bent left T5 extended rotated and side bent right inhaled rib T9 extended rotated and side bent left L2 flexed rotated and side bent right Sacrum right on right       Assessment and Plan:  Lumbar back pain Lumbar pain but also patient did have some more tightness noted in the parascapular region today.  Discussed icing regimen and home exercises, discussed with her activities to do which wants to avoid.  Increase activity slowly.  Discussed icing regimen.  Follow-up with me again in 6 to 8 weeks   Nonallopathic problems  Decision today to treat with OMT was based on Physical Exam  After verbal consent patient was treated with HVLA, ME, FPR techniques in cervical, rib, thoracic, lumbar, and sacral  areas  Patient tolerated the procedure well with improvement in symptoms  Patient given exercises, stretches and lifestyle modifications  See medications in patient instructions if given  Patient will follow up in 4-8 weeks    The above documentation has been  reviewed and is accurate and complete [name], DO        Note: This dictation was prepared with Dragon dictation along with smaller phrase technology. Any transcriptional errors that result from this process are unintentional.

## 2021-07-21 ENCOUNTER — Encounter: Payer: Self-pay | Admitting: Family Medicine

## 2021-07-21 ENCOUNTER — Other Ambulatory Visit: Payer: Self-pay

## 2021-07-21 ENCOUNTER — Ambulatory Visit: Payer: BC Managed Care – PPO | Admitting: Family Medicine

## 2021-07-21 VITALS — BP 130/90 | HR 64 | Ht 66.0 in

## 2021-07-21 DIAGNOSIS — M9901 Segmental and somatic dysfunction of cervical region: Secondary | ICD-10-CM

## 2021-07-21 DIAGNOSIS — M9904 Segmental and somatic dysfunction of sacral region: Secondary | ICD-10-CM

## 2021-07-21 DIAGNOSIS — M9902 Segmental and somatic dysfunction of thoracic region: Secondary | ICD-10-CM

## 2021-07-21 DIAGNOSIS — M545 Low back pain, unspecified: Secondary | ICD-10-CM | POA: Diagnosis not present

## 2021-07-21 DIAGNOSIS — M9908 Segmental and somatic dysfunction of rib cage: Secondary | ICD-10-CM

## 2021-07-21 DIAGNOSIS — M9903 Segmental and somatic dysfunction of lumbar region: Secondary | ICD-10-CM

## 2021-07-21 NOTE — Assessment & Plan Note (Signed)
Lumbar pain but also patient did have some more tightness noted in the parascapular region today.  Discussed icing regimen and home exercises, discussed with her activities to do which wants to avoid.  Increase activity slowly.  Discussed icing regimen.  Follow-up with me again in 6 to 8 weeks

## 2021-07-21 NOTE — Patient Instructions (Signed)
Good to see you  Stay active  Keep watch on that blood pressure Follow up with other docs  See me again in 6-8 weeks

## 2021-07-22 ENCOUNTER — Encounter (INDEPENDENT_AMBULATORY_CARE_PROVIDER_SITE_OTHER): Payer: Self-pay | Admitting: Family Medicine

## 2021-07-22 ENCOUNTER — Ambulatory Visit (INDEPENDENT_AMBULATORY_CARE_PROVIDER_SITE_OTHER): Payer: BC Managed Care – PPO | Admitting: Family Medicine

## 2021-07-22 VITALS — BP 122/80 | HR 71 | Temp 97.8°F | Ht 66.0 in | Wt 199.0 lb

## 2021-07-22 DIAGNOSIS — Z9189 Other specified personal risk factors, not elsewhere classified: Secondary | ICD-10-CM | POA: Diagnosis not present

## 2021-07-22 DIAGNOSIS — Z6836 Body mass index (BMI) 36.0-36.9, adult: Secondary | ICD-10-CM

## 2021-07-22 DIAGNOSIS — R7301 Impaired fasting glucose: Secondary | ICD-10-CM

## 2021-07-22 DIAGNOSIS — I10 Essential (primary) hypertension: Secondary | ICD-10-CM

## 2021-07-22 DIAGNOSIS — R7989 Other specified abnormal findings of blood chemistry: Secondary | ICD-10-CM

## 2021-07-22 MED ORDER — TIRZEPATIDE 5 MG/0.5ML ~~LOC~~ SOAJ: 5.0000 mg | SUBCUTANEOUS | 0 refills | Status: DC

## 2021-07-26 NOTE — Progress Notes (Signed)
Chief Complaint:   OBESITY Lucas Young is here to discuss his progress with his obesity treatment plan along with follow-up of his obesity related diagnoses.   Today's visit was #: 10  Starting weight: 226 lbs Starting date: 12/24/2020 Today's weight: 199 lbs Today's date: 07/22/2021 Weight change since last visit: 2 lbs Total lbs lost to date: 27 lbs Body mass index is 32.12 kg/m.  Total weight loss percentage to date: -11.95%  Current Meal Plan: the Category 4 Plan for 92% of the time.  Current Exercise Plan: Walking/strength training/cardio/yoga for 105-245 minutes 7 times per week. Current Anti-Obesity Medications: Wegovy 2.4 mg subcutaneously weekly. Side effects: Constipation and bloating.  Interim History:  Wheeler reports constipation and bloating with [id number].    Assessment/Plan:   1. Impaired fasting glucose, with polyphagia Did not tolerate Wegovy at higher doses. After discussion, patient would like to start below medication. Expectations, risks, and potential side effects reviewed.   Plan:  Start Mounjaro 5 mg subcutaneously weekly, as per below.  - Start tirzepatide [facility]) 5 MG/0.5ML Pen; Inject 5 mg into the skin once a week.  Dispense: 2 mL; Refill: 0  2. Essential hypertension, now controlled without medications At goal. Medications: None.  Episodes of near syncope with Micardis x3 days.  See MyChart messages.  Discontinued.  No more symptoms.  Blood pressure now normal in office.  Plan: Avoid buying foods that are: processed, frozen, or prepackaged to avoid excess salt. We will watch for signs of hypotension as he continues lifestyle modifications.  BP Readings from Last 3 Encounters:  07/22/21 122/80  07/21/21 130/90  06/23/21 (!) 155/94   Lab Results  Component Value Date   CREATININE 0.94 04/19/2021   3. Low testosterone, with hyperprolactinemia and OSA, followed by Endocrinology Shea is being followed by Dr. Loanne Drilling in  Endocrinology.  He is taking Parlodel 5 mg daily.   4. At risk for deficient intake of food [place] was given extensive education and counseling today of more than 8 minutes on risks associated with deficient food intake.  Counseled him on the importance of following our prescribed meal plan and eating adequate amounts of protein.  Discussed with Kenji that inadequate food intake over longer periods of time can slow their metabolism down significantly.   5. Obesity, current BMI 32.5  Course: Rollins is currently in the action stage of change. As such, his goal is to continue with weight loss efforts.   Nutrition goals: He has agreed to the Category 4 Plan.   Exercise goals:  As is.  Behavioral modification strategies: increasing lean protein intake, decreasing simple carbohydrates, increasing vegetables, and increasing water intake.  Aikeem has agreed to follow-up with our clinic in 4 weeks. He was informed of the importance of frequent follow-up visits to maximize his success with intensive lifestyle modifications for his multiple health conditions.   Objective:   Blood pressure 122/80, pulse 71, temperature 97.8 F (36.6 C), temperature source Oral, height '5\' 6"'$  (1.676 m), weight 199 lb (90.3 kg), SpO2 96 %. Body mass index is 32.12 kg/m.  General: Cooperative, alert, well developed, in no acute distress. HEENT: Conjunctivae and lids unremarkable. Cardiovascular: Regular rhythm.  Lungs: Normal work of breathing. Neurologic: No focal deficits.   Lab Results  Component Value Date   CREATININE 0.94 04/19/2021   BUN 26 (H) 04/19/2021   NA 140 04/19/2021   K 3.9 04/19/2021   CL 100 04/19/2021   CO2 27 04/19/2021   Lab Results  Component Value Date   ALT 25 03/24/2021   AST 23 03/24/2021   ALKPHOS 72 03/24/2021   BILITOT 0.4 03/24/2021   Lab Results  Component Value Date   HGBA1C 5.8 06/22/2021   HGBA1C 5.8 (H) 03/24/2021   HGBA1C 6.1 (H) 12/24/2020    Lab Results  Component Value Date   INSULIN 14.9 03/24/2021   INSULIN 14.6 12/24/2020   Lab Results  Component Value Date   TSH 3.790 12/24/2020   Lab Results  Component Value Date   CHOL 162 03/24/2021   HDL 32 (L) 03/24/2021   LDLCALC 109 (H) 03/24/2021   TRIG 116 03/24/2021   CHOLHDL 5.1 (H) 03/24/2021   Lab Results  Component Value Date   VD25OH 75.6 03/24/2021   VD25OH 20.7 (L) 12/24/2020   Lab Results  Component Value Date   WBC 6.9 12/24/2020   HGB 15.4 12/24/2020   HCT 46.0 12/24/2020   MCV 87 12/24/2020   PLT 282 12/24/2020   Lab Results  Component Value Date   IRON 134 12/24/2020   TIBC 306 12/24/2020   FERRITIN 140 12/24/2020   Attestation Statements:   Reviewed by clinician on day of visit: allergies, medications, problem list, medical history, surgical history, family history, social history, and previous encounter notes.  I, Water quality scientist, CMA, am acting as transcriptionist for Briscoe Deutscher, DO  I have reviewed the above documentation for accuracy and completeness, and I agree with the above. Briscoe Deutscher, DO

## 2021-07-29 ENCOUNTER — Other Ambulatory Visit: Payer: Self-pay

## 2021-07-29 ENCOUNTER — Other Ambulatory Visit (INDEPENDENT_AMBULATORY_CARE_PROVIDER_SITE_OTHER): Payer: BC Managed Care – PPO

## 2021-07-29 DIAGNOSIS — D443 Neoplasm of uncertain behavior of pituitary gland: Secondary | ICD-10-CM

## 2021-07-29 DIAGNOSIS — R7989 Other specified abnormal findings of blood chemistry: Secondary | ICD-10-CM

## 2021-07-29 DIAGNOSIS — D352 Benign neoplasm of pituitary gland: Secondary | ICD-10-CM

## 2021-07-30 LAB — PROLACTIN: Prolactin: 30.3 ng/mL — ABNORMAL HIGH (ref 2.0–18.0)

## 2021-08-02 ENCOUNTER — Encounter: Payer: Self-pay | Admitting: Endocrinology

## 2021-08-04 ENCOUNTER — Other Ambulatory Visit: Payer: Self-pay | Admitting: Endocrinology

## 2021-08-04 DIAGNOSIS — R7989 Other specified abnormal findings of blood chemistry: Secondary | ICD-10-CM

## 2021-08-04 LAB — TESTOSTERONE,FREE AND TOTAL
Testosterone, Free: 4.5 pg/mL — ABNORMAL LOW (ref 6.6–18.1)
Testosterone: 272 ng/dL (ref 264–916)

## 2021-08-04 MED ORDER — BROMOCRIPTINE MESYLATE 5 MG PO CAPS: 5.0000 mg | ORAL_CAPSULE | Freq: Two times a day (BID) | ORAL | 3 refills | Status: DC

## 2021-08-05 ENCOUNTER — Ambulatory Visit (AMBULATORY_SURGERY_CENTER): Payer: BC Managed Care – PPO | Admitting: *Deleted

## 2021-08-05 ENCOUNTER — Other Ambulatory Visit: Payer: Self-pay

## 2021-08-05 VITALS — Ht 66.0 in | Wt 199.0 lb

## 2021-08-05 DIAGNOSIS — Z1211 Encounter for screening for malignant neoplasm of colon: Secondary | ICD-10-CM

## 2021-08-05 MED ORDER — PEG 3350-KCL-NA BICARB-NACL 420 G PO SOLR
4000.0000 mL | Freq: Once | ORAL | 0 refills | Status: AC
Start: 2021-08-05 — End: 2021-08-05

## 2021-08-05 NOTE — Progress Notes (Signed)
Patient's pre-visit was done today over the phone with the patient due to COVID-19 pandemic. Name,DOB and address verified. Insurance verified. Patient denies any allergies to Eggs and Soy. Patient denies any problems with anesthesia/sedation. Patient is not taking any diet pills or blood thinners. No home Oxygen. Packet of Prep instructions mailed to patient including a copy of a consent form-pt is aware. Patient understands to call us back with any questions or concerns. Patient is aware of our care-partner policy and 0000000 safety protocol.   EMMI education assigned to the patient for the procedure, sent to [place].   The patient is COVID-19 vaccinated.

## 2021-08-16 ENCOUNTER — Other Ambulatory Visit (INDEPENDENT_AMBULATORY_CARE_PROVIDER_SITE_OTHER): Payer: Self-pay | Admitting: Family Medicine

## 2021-08-16 DIAGNOSIS — R7301 Impaired fasting glucose: Secondary | ICD-10-CM

## 2021-08-16 NOTE — Telephone Encounter (Signed)
Pt last seen by Dr. Wallace.  

## 2021-08-17 ENCOUNTER — Encounter: Payer: BC Managed Care – PPO | Admitting: Gastroenterology

## 2021-08-17 MED ORDER — TIRZEPATIDE 7.5 MG/0.5ML ~~LOC~~ SOAJ: 7.5000 mg | SUBCUTANEOUS | 0 refills | Status: DC

## 2021-08-17 NOTE — Telephone Encounter (Signed)
Pt last seen by Dr. Wallace.  

## 2021-08-30 ENCOUNTER — Other Ambulatory Visit: Payer: Self-pay

## 2021-08-30 ENCOUNTER — Ambulatory Visit (INDEPENDENT_AMBULATORY_CARE_PROVIDER_SITE_OTHER): Payer: BC Managed Care – PPO | Admitting: Family Medicine

## 2021-08-30 ENCOUNTER — Encounter (INDEPENDENT_AMBULATORY_CARE_PROVIDER_SITE_OTHER): Payer: Self-pay | Admitting: Family Medicine

## 2021-08-30 VITALS — BP 137/82 | HR 61 | Temp 97.9°F | Ht 66.0 in | Wt 198.0 lb

## 2021-08-30 DIAGNOSIS — E221 Hyperprolactinemia: Secondary | ICD-10-CM | POA: Diagnosis not present

## 2021-08-30 DIAGNOSIS — R7301 Impaired fasting glucose: Secondary | ICD-10-CM | POA: Diagnosis not present

## 2021-08-30 DIAGNOSIS — R0602 Shortness of breath: Secondary | ICD-10-CM

## 2021-08-30 DIAGNOSIS — I10 Essential (primary) hypertension: Secondary | ICD-10-CM

## 2021-08-30 DIAGNOSIS — Z6836 Body mass index (BMI) 36.0-36.9, adult: Secondary | ICD-10-CM

## 2021-08-30 DIAGNOSIS — E785 Hyperlipidemia, unspecified: Secondary | ICD-10-CM

## 2021-08-30 DIAGNOSIS — G4733 Obstructive sleep apnea (adult) (pediatric): Secondary | ICD-10-CM | POA: Diagnosis not present

## 2021-08-30 MED ORDER — TIRZEPATIDE 10 MG/0.5ML ~~LOC~~ SOAJ: 10.0000 mg | SUBCUTANEOUS | 1 refills | Status: DC

## 2021-08-31 ENCOUNTER — Encounter (INDEPENDENT_AMBULATORY_CARE_PROVIDER_SITE_OTHER): Payer: Self-pay

## 2021-08-31 ENCOUNTER — Encounter (INDEPENDENT_AMBULATORY_CARE_PROVIDER_SITE_OTHER): Payer: Self-pay | Admitting: Family Medicine

## 2021-08-31 ENCOUNTER — Telehealth (INDEPENDENT_AMBULATORY_CARE_PROVIDER_SITE_OTHER): Payer: Self-pay

## 2021-08-31 NOTE — Telephone Encounter (Signed)
Pt called in and stated that the pharmacy reached out to him and state that his insurance denied the PA for [facility]. The pt is requesting a call back. Please advise

## 2021-08-31 NOTE — Telephone Encounter (Signed)
Pt sent mychart message, addressed through that msg

## 2021-09-01 ENCOUNTER — Encounter (INDEPENDENT_AMBULATORY_CARE_PROVIDER_SITE_OTHER): Payer: Self-pay

## 2021-09-03 ENCOUNTER — Encounter: Payer: Self-pay | Admitting: Gastroenterology

## 2021-09-06 NOTE — Progress Notes (Signed)
Lucas Young [address] [place] Phone: [phone] Subjective:   IVilma Meckel, am serving as a scribe for Dr. Hulan Saas. This visit occurred during the SARS-CoV-2 public health emergency.  Safety protocols were in place, including screening questions prior to the visit, additional usage of staff PPE, and extensive cleaning of exam room while observing appropriate contact time as indicated for disinfecting solutions.   I'm seeing this patient by the request  of:  Vivi Barrack, MD  CC: Low back pain follow-up  [id number]  Lucas Young is a 65 y.o. male coming in with complaint of back and neck pain. OMT 07/21/2021. Patient states pain remains unchanged. His right elbow is flaring up from a previous incident.  Patient states that he is starting getting some mild pain with daily activities but nothing severe.  Wants to make sure that he can stop it before get severe.  Medications patient has been prescribed: None  Taking:         Reviewed prior external information including notes and imaging from previsou exam, outside providers and external EMR if available.   As well as notes that were available from care everywhere and other healthcare systems.  Past medical history, social, surgical and family history all reviewed in electronic medical record.  No pertanent information unless stated regarding to the chief complaint.   Past Medical History:  Diagnosis Date   Allergy    Anemia    Anxiety    Arthritis    Back pain    Depression    Food allergy    Hyperlipidemia    Hypertension    Lactose intolerance    Morton's neuroma    Obesity    Sleep apnea    uses CPAP    No Known Allergies   Review of Systems:  No headache, visual changes, nausea, vomiting, diarrhea, constipation, dizziness, abdominal pain, skin rash, fevers, chills, night sweats, weight loss, swollen lymph nodes, body aches, joint  swelling, chest pain, shortness of breath, mood changes. POSITIVE muscle aches  Objective  Blood pressure (!) 158/92, pulse (!) 56, height 5\' 6"  (1.676 m), weight 198 lb (89.8 kg), SpO2 95 %.   General: No apparent distress alert and oriented x3 mood and affect normal, dressed appropriately.  Patient has lost weight since last exam. HEENT: Pupils equal, extraocular movements intact  Respiratory: Patient's speak in full sentences and does not appear short of breath  Cardiovascular: No lower extremity edema, non tender, no erythema  Gait normal with good balance and coordination.  MSK:  Non tender with full range of motion and good stability and symmetric strength and tone of shoulders, elbows, wrist, hip, knee and ankles bilaterally.  Back -core strength does show some mild improvement noted.  Tightness noted with FABER test right greater than left.  Negative straight leg test.  Osteopathic findings  C3 flexed rotated and side bent right C7 flexed rotated and side bent left T3 extended rotated and side bent right inhaled rib T6 extended rotated and side bent left L2 flexed rotated and side bent right Sacrum right on right       Assessment and Plan:  Lumbar back pain Patient is making good progress at this time.  Patient has noticed that he is with the weight loss and decreased 20 pounds he is feeling significantly better at this moment as well.  Discussed icing regimen and home exercises.  Discussed which activities to doing which wants  to avoid.  Increase activity slowly.  Patient will follow up with me again in 6 to 8 weeks.  Right lateral epicondylitis Elbow anatomy was reviewed, and tendinopathy was explained.  Pt. given a home rehab program. Start with isometrics and ROM, then a series of concentric and eccentric exercises should be done starting with no weight, work up to 1 lb, hammer, etc.  Use counterforce strap if working or using hands.  Formal PT would be  beneficial. Emphasized stretching an cross-friction massage Emphasized proper palms up lifting biomechanics to unload ECRB Return in 6 weeks to continue wrist brace and formal physical therapy   Nonallopathic problems  Decision today to treat with OMT was based on Physical Exam  After verbal consent patient was treated with HVLA, ME, FPR techniques in cervical, rib, thoracic, lumbar, and sacral  areas  Patient tolerated the procedure well with improvement in symptoms  Patient given exercises, stretches and lifestyle modifications  See medications in patient instructions if given  Patient will follow up in 4-8 weeks      The above documentation has been reviewed and is accurate and complete [name], DO        Note: This dictation was prepared with Dragon dictation along with smaller phrase technology. Any transcriptional errors that result from this process are unintentional.

## 2021-09-07 ENCOUNTER — Encounter: Payer: Self-pay | Admitting: Family Medicine

## 2021-09-07 ENCOUNTER — Ambulatory Visit: Payer: BC Managed Care – PPO | Admitting: Family Medicine

## 2021-09-07 ENCOUNTER — Other Ambulatory Visit: Payer: Self-pay

## 2021-09-07 VITALS — BP 158/92 | HR 56 | Ht 66.0 in | Wt 198.0 lb

## 2021-09-07 DIAGNOSIS — M9901 Segmental and somatic dysfunction of cervical region: Secondary | ICD-10-CM

## 2021-09-07 DIAGNOSIS — M545 Low back pain, unspecified: Secondary | ICD-10-CM | POA: Diagnosis not present

## 2021-09-07 DIAGNOSIS — M9902 Segmental and somatic dysfunction of thoracic region: Secondary | ICD-10-CM

## 2021-09-07 DIAGNOSIS — M9903 Segmental and somatic dysfunction of lumbar region: Secondary | ICD-10-CM

## 2021-09-07 DIAGNOSIS — M7711 Lateral epicondylitis, right elbow: Secondary | ICD-10-CM

## 2021-09-07 DIAGNOSIS — M9904 Segmental and somatic dysfunction of sacral region: Secondary | ICD-10-CM

## 2021-09-07 DIAGNOSIS — M9908 Segmental and somatic dysfunction of rib cage: Secondary | ICD-10-CM | POA: Diagnosis not present

## 2021-09-07 NOTE — Progress Notes (Signed)
Chief Complaint:   OBESITY Lucas Young is here to discuss his progress with his obesity treatment plan along with follow-up of his obesity related diagnoses.   Today's visit was #: 11 Starting weight: 226 lbs Starting date: 12/24/2020 Today's weight: 198 lbs Today's date: 08/30/2021 Weight change since last visit: 1 lb Total lbs lost to date: 28 lbs Body mass index is 31.96 kg/m.  Total weight loss percentage to date: -12.39%  Current Meal Plan: the Category 4 Plan for 90% of the time.  Current Exercise Plan: Walking/strength training/cardio/yoga for 105-245 minutes 7 times per week. Current Anti-Obesity Medications: Mounjaro 7.5 mg subcutaneously weekly. Side effects: None.  Interim History:  Lucas Young just celebrated his birthday.  He is not tolerating his CPAP.  He is looking into an implant.  He says he is tolerating Mounjaro well.  Down 28 pounds today.  Assessment/Plan:   1. Impaired fasting glucose, with polyphagia Not at goal. Current treatment: Mounjaro 7.5 mg subcutaneously weekly. He will continue to focus on protein-rich, low simple carbohydrate foods. We reviewed the importance of hydration, regular exercise for stress reduction, and restorative sleep.  Plan:  Increase Mounjaro to 10 mg subcutaneously weekly, as per below.  - Increase and refill tirzepatide (MOUNJARO) 10 MG/0.5ML Pen; Inject 10 mg into the skin once a week.  Dispense: 6 mL; Refill: 1  2. SOB (shortness of breath) on exertion IC performed today.  REE is 2290.  3. OSA (obstructive sleep apnea) He is not tolerating his CPAP.    Goal: Treatment of OSA via CPAP compliance and weight loss. Plasma ghrelin levels (appetite or "hunger hormone") are significantly higher in OSA patients than in BMI-matched controls, but decrease to levels similar to those of obese patients without OSA after CPAP treatment.  Weight loss improves OSA by several mechanisms, including reduction in fatty tissue in the  throat (i.e. parapharyngeal fat) and the tongue. Loss of abdominal fat increases mediastinal traction on the upper airway making it less likely to collapse during sleep. Studies have also shown that compliance with CPAP treatment improves leptin (hunger inhibitory hormone) imbalance.  4. Hyperprolactinemia (Lucas Young) Lucas Young is taking bromocriptine 5 mg twice daily for hyperprolactinemia.  Followed by Dr. Loanne Lucas Young.  5. Essential hypertension, now controlled without medications Elevated today. Medications: None.   Plan: Avoid buying foods that are: processed, frozen, or prepackaged to avoid excess salt. We will watch for signs of hypotension as he continues lifestyle modifications.  BP Readings from Last 3 Encounters:  09/07/21 (!) 158/92  08/30/21 137/82  07/22/21 122/80   Lab Results  Component Value Date   CREATININE 0.94 04/19/2021   6. Dyslipidemia Course: Not at goal. Lipid-lowering medications: None.   Plan: Dietary changes: Increase soluble fiber, decrease simple carbohydrates, decrease saturated fat. Exercise changes: Moderate to vigorous-intensity aerobic activity 150 minutes per week or as tolerated. We will continue to monitor along with PCP/specialists as it pertains to his weight loss journey.  Lab Results  Component Value Date   CHOL 162 03/24/2021   HDL 32 (L) 03/24/2021   LDLCALC 109 (H) 03/24/2021   TRIG 116 03/24/2021   CHOLHDL 5.1 (H) 03/24/2021   Lab Results  Component Value Date   ALT 25 03/24/2021   AST 23 03/24/2021   ALKPHOS 72 03/24/2021   BILITOT 0.4 03/24/2021   The 10-year ASCVD risk score (Arnett DK, et al., 2019) is: 18.6%   Values used to calculate the score:     Age: 65 years  Sex: Male     Is Non-Hispanic African American: No     Diabetic: No     Tobacco smoker: No     Systolic Blood Pressure: 086 mmHg     Is BP treated: No     HDL Cholesterol: 32 mg/dL     Total Cholesterol: 162 mg/dL  7. Obesity, current BMI 32  Course:  Tc is currently in the action stage of change. As such, his goal is to continue with weight loss efforts.   Nutrition goals: He has agreed to keeping a food journal and adhering to recommended goals of 1500-1800 calories and 125 grams of protein.   Exercise goals: For substantial health benefits, adults should do at least 150 minutes (2 hours and 30 minutes) a week of moderate-intensity, or 75 minutes (1 hour and 15 minutes) a week of vigorous-intensity aerobic physical activity, or an equivalent combination of moderate- and vigorous-intensity aerobic activity. Aerobic activity should be performed in episodes of at least 10 minutes, and preferably, it should be spread throughout the week.  Behavioral modification strategies: increasing lean protein intake, decreasing simple carbohydrates, increasing vegetables, and increasing water intake.  Lucas Young has agreed to follow-up with our clinic in 4 weeks. He was informed of the importance of frequent follow-up visits to maximize his success with intensive lifestyle modifications for his multiple health conditions.   Objective:   Blood pressure 137/82, pulse 61, temperature 97.9 F (36.6 C), temperature source Oral, height 5\' 6"  (1.676 m), weight 198 lb (89.8 kg), SpO2 97 %. Body mass index is 31.96 kg/m.  General: Cooperative, alert, well developed, in no acute distress. HEENT: Conjunctivae and lids unremarkable. Cardiovascular: Regular rhythm.  Lungs: Normal work of breathing. Neurologic: No focal deficits.   Lab Results  Component Value Date   CREATININE 0.94 04/19/2021   BUN 26 (H) 04/19/2021   NA 140 04/19/2021   K 3.9 04/19/2021   CL 100 04/19/2021   CO2 27 04/19/2021   Lab Results  Component Value Date   ALT 25 03/24/2021   AST 23 03/24/2021   ALKPHOS 72 03/24/2021   BILITOT 0.4 03/24/2021   Lab Results  Component Value Date   HGBA1C 5.8 06/22/2021   HGBA1C 5.8 (H) 03/24/2021   HGBA1C 6.1 (H) 12/24/2020   Lab  Results  Component Value Date   INSULIN 14.9 03/24/2021   INSULIN 14.6 12/24/2020   Lab Results  Component Value Date   TSH 3.790 12/24/2020   Lab Results  Component Value Date   CHOL 162 03/24/2021   HDL 32 (L) 03/24/2021   LDLCALC 109 (H) 03/24/2021   TRIG 116 03/24/2021   CHOLHDL 5.1 (H) 03/24/2021   Lab Results  Component Value Date   VD25OH 75.6 03/24/2021   VD25OH 20.7 (L) 12/24/2020   Lab Results  Component Value Date   WBC 6.9 12/24/2020   HGB 15.4 12/24/2020   HCT 46.0 12/24/2020   MCV 87 12/24/2020   PLT 282 12/24/2020   Lab Results  Component Value Date   IRON 134 12/24/2020   TIBC 306 12/24/2020   FERRITIN 140 12/24/2020   Attestation Statements:   Reviewed by clinician on day of visit: allergies, medications, problem list, medical history, surgical history, family history, social history, and previous encounter notes.  Time spent on visit including pre-visit chart review and post-visit care and charting was 45 minutes.   I, Water quality scientist, CMA, am acting as transcriptionist for Briscoe Deutscher, DO  I have reviewed the above documentation  for accuracy and completeness, and I agree with the above. -  Briscoe Deutscher, DO, MS, FAAFP, DABOM - Family and Bariatric Medicine.

## 2021-09-07 NOTE — Assessment & Plan Note (Signed)
Elbow anatomy was reviewed, and tendinopathy was explained.  Pt. given a home rehab program. Start with isometrics and ROM, then a series of concentric and eccentric exercises should be done starting with no weight, work up to 1 lb, hammer, etc.  Use counterforce strap if working or using hands.  Formal PT would be beneficial. Emphasized stretching an cross-friction massage Emphasized proper palms up lifting biomechanics to unload ECRB Return in 6 weeks to continue wrist brace and formal physical therapy

## 2021-09-07 NOTE — Assessment & Plan Note (Signed)
Patient is making good progress at this time.  Patient has noticed that he is with the weight loss and decreased 20 pounds he is feeling significantly better at this moment as well.  Discussed icing regimen and home exercises.  Discussed which activities to doing which wants to avoid.  Increase activity slowly.  Patient will follow up with me again in 6 to 8 weeks.

## 2021-09-07 NOTE — Patient Instructions (Signed)
Do prescribed exercises at least 3x a week Ice after working out Try to avoid overhead lifting Keep up the weight loss See you again in 6-8 weeks

## 2021-09-09 ENCOUNTER — Other Ambulatory Visit: Payer: Self-pay

## 2021-09-09 ENCOUNTER — Other Ambulatory Visit (INDEPENDENT_AMBULATORY_CARE_PROVIDER_SITE_OTHER): Payer: BC Managed Care – PPO

## 2021-09-09 DIAGNOSIS — R7989 Other specified abnormal findings of blood chemistry: Secondary | ICD-10-CM | POA: Diagnosis not present

## 2021-09-09 LAB — CBC WITH DIFFERENTIAL/PLATELET
Basophils Absolute: 0.1 10*3/uL (ref 0.0–0.1)
Basophils Relative: 0.7 % (ref 0.0–3.0)
Eosinophils Absolute: 0.3 10*3/uL (ref 0.0–0.7)
Eosinophils Relative: 4.7 % (ref 0.0–5.0)
HCT: 43.2 % (ref 39.0–52.0)
Hemoglobin: 14.3 g/dL (ref 13.0–17.0)
Lymphocytes Relative: 31.5 % (ref 12.0–46.0)
Lymphs Abs: 2.3 10*3/uL (ref 0.7–4.0)
MCHC: 33 g/dL (ref 30.0–36.0)
MCV: 85.2 fl (ref 78.0–100.0)
Monocytes Absolute: 0.7 10*3/uL (ref 0.1–1.0)
Monocytes Relative: 9.2 % (ref 3.0–12.0)
Neutro Abs: 4 10*3/uL (ref 1.4–7.7)
Neutrophils Relative %: 53.9 % (ref 43.0–77.0)
Platelets: 251 10*3/uL (ref 150.0–400.0)
RBC: 5.06 Mil/uL (ref 4.22–5.81)
RDW: 13.5 % (ref 11.5–15.5)
WBC: 7.3 10*3/uL (ref 4.0–10.5)

## 2021-09-10 ENCOUNTER — Encounter: Payer: Self-pay | Admitting: Endocrinology

## 2021-09-10 LAB — TESTOSTERONE,FREE AND TOTAL
Testosterone, Free: 5.5 pg/mL — ABNORMAL LOW (ref 6.6–18.1)
Testosterone: 297 ng/dL (ref 264–916)

## 2021-09-10 LAB — PROLACTIN: Prolactin: 17.7 ng/mL (ref 2.0–18.0)

## 2021-09-13 ENCOUNTER — Other Ambulatory Visit: Payer: Self-pay | Admitting: Endocrinology

## 2021-09-13 DIAGNOSIS — R7989 Other specified abnormal findings of blood chemistry: Secondary | ICD-10-CM

## 2021-09-13 MED ORDER — CABERGOLINE 0.5 MG PO TABS: 1.0000 mg | ORAL_TABLET | ORAL | 3 refills | Status: DC

## 2021-09-16 NOTE — Telephone Encounter (Signed)
This was taken care of in a different message

## 2021-09-17 ENCOUNTER — Encounter: Payer: BC Managed Care – PPO | Admitting: Gastroenterology

## 2021-09-30 ENCOUNTER — Other Ambulatory Visit: Payer: Self-pay

## 2021-09-30 ENCOUNTER — Encounter (INDEPENDENT_AMBULATORY_CARE_PROVIDER_SITE_OTHER): Payer: Self-pay | Admitting: Family Medicine

## 2021-09-30 ENCOUNTER — Ambulatory Visit (INDEPENDENT_AMBULATORY_CARE_PROVIDER_SITE_OTHER): Payer: BC Managed Care – PPO | Admitting: Family Medicine

## 2021-09-30 VITALS — BP 158/92 | HR 72 | Temp 97.8°F | Ht 66.0 in | Wt 196.0 lb

## 2021-09-30 DIAGNOSIS — Z6836 Body mass index (BMI) 36.0-36.9, adult: Secondary | ICD-10-CM

## 2021-09-30 DIAGNOSIS — G4733 Obstructive sleep apnea (adult) (pediatric): Secondary | ICD-10-CM | POA: Diagnosis not present

## 2021-09-30 DIAGNOSIS — Z72820 Sleep deprivation: Secondary | ICD-10-CM

## 2021-09-30 DIAGNOSIS — R7989 Other specified abnormal findings of blood chemistry: Secondary | ICD-10-CM

## 2021-09-30 DIAGNOSIS — Z9989 Dependence on other enabling machines and devices: Secondary | ICD-10-CM

## 2021-09-30 DIAGNOSIS — R7301 Impaired fasting glucose: Secondary | ICD-10-CM | POA: Diagnosis not present

## 2021-09-30 MED ORDER — TIRZEPATIDE 12.5 MG/0.5ML ~~LOC~~ SOAJ: 12.5000 mg | SUBCUTANEOUS | 0 refills | Status: DC

## 2021-09-30 MED ORDER — TRAZODONE HCL 50 MG PO TABS: 25.0000 mg | ORAL_TABLET | Freq: Every evening | ORAL | 3 refills | Status: DC | PRN

## 2021-09-30 NOTE — Progress Notes (Signed)
Chief Complaint:   OBESITY Lucas Young is here to discuss his progress with his obesity treatment plan along with follow-up of his obesity related diagnoses. See Medical Weight Management Flowsheet for complete bioelectrical impedance results.  Today's visit was #: 12 Starting weight: 226 lbs Starting date: 12/24/2020 Weight change since last visit: 2 lbs Total lbs lost to date: 30 lbs Total weight loss percentage to date: -13.27%  Nutrition Plan: Keeping a food journal and adhering to recommended goals of 1500-1800 calories and 125 grams of protein daily for 85% of the time. Activity: Walking/strength training/cardio/yoga for 105-245 minutes 7 times per week.  Anti-obesity medications: Mounjaro 10 mg subcutaneously weekly. Reported side effects: None.  Interim History:  Reviewed recent labs.  [name] says he is having a difficult time sleeping due to CPAP. He has an upcoming appointment to discuss an implant.  Plan:  Labs at next visit.  Assessment/Plan:   1. Impaired fasting glucose, with polyphagia Improving, but not optimized. Current treatment: Mounjaro 10 mg subcutaneously weekly.    Plan:  Increase Mounjaro to 12.5 mg subcutaneously weekly. He will continue to focus on protein-rich, low simple carbohydrate foods. We reviewed the importance of hydration, regular exercise for stress reduction, and restorative sleep.  - Increase tirzepatide (MOUNJARO) 12.5 MG/0.5ML Pen; Inject 12.5 mg into the skin once a week.  Dispense: 6 mL; Refill: 0  2. OSA on CPAP OSA is a cause of systemic hypertension and is associated with an increased incidence of stroke, heart failure, atrial fibrillation, and coronary heart disease. Severe OSA increases all-cause mortality and cardiovascular mortality.   Goal: Treatment of OSA via CPAP compliance and weight loss. Plasma ghrelin levels (appetite or "hunger hormone") are significantly higher in OSA patients than in BMI-matched controls, but  decrease to levels similar to those of obese patients without OSA after CPAP treatment.  Weight loss improves OSA by several mechanisms, including reduction in fatty tissue in the throat (i.e. parapharyngeal fat) and the tongue. Loss of abdominal fat increases mediastinal traction on the upper airway making it less likely to collapse during sleep. Studies have also shown that compliance with CPAP treatment improves leptin (hunger inhibitory hormone) imbalance.  3. Poor sleep [name] has difficulty sleeping due to his CPAP machine.  Plan:  trazodone 25-50 mg at bedtime as needed for sleep.  - Start traZODone (DESYREL) 50 MG tablet; Take 0.5-1 tablets (25-50 mg total) by mouth at bedtime as needed for sleep.  Dispense: 30 tablet; Refill: 3  4. Low testosterone, with hyperprolactinemia and OSA, followed by Endocrinology Will continue to follow along as it relates to his weight loss journey. Will add labs at next visit with our lab draw as a courtesy.  5. Obesity BMI today is 31  Course: [name] is currently in the action stage of change. As such, his goal is to continue with weight loss efforts.   Nutrition goals: He has agreed to keeping a food journal and adhering to recommended goals of 1500-1800 calories and 125 grams of protein.   Exercise goals:  As is.  Behavioral modification strategies: increasing lean protein intake, decreasing simple carbohydrates, and increasing vegetables.  Bonnie has agreed to follow-up with our clinic in 4 weeks. He was informed of the importance of frequent follow-up visits to maximize his success with intensive lifestyle modifications for his multiple health conditions.   Objective:   Blood pressure (!) 158/92, pulse 72, temperature 97.8 F (36.6 C), temperature source Oral, height 5\' 6"  (1.676 m), weight  196 lb (88.9 kg), SpO2 97 %. Body mass index is 31.64 kg/m.  General: Cooperative, alert, well developed, in no acute distress. HEENT:  Conjunctivae and lids unremarkable. Cardiovascular: Regular rhythm.  Lungs: Normal work of breathing. Neurologic: No focal deficits.   Lab Results  Component Value Date   CREATININE 0.94 04/19/2021   BUN 26 (H) 04/19/2021   NA 140 04/19/2021   K 3.9 04/19/2021   CL 100 04/19/2021   CO2 27 04/19/2021   Lab Results  Component Value Date   ALT 25 03/24/2021   AST 23 03/24/2021   ALKPHOS 72 03/24/2021   BILITOT 0.4 03/24/2021   Lab Results  Component Value Date   HGBA1C 5.8 06/22/2021   HGBA1C 5.8 (H) 03/24/2021   HGBA1C 6.1 (H) 12/24/2020   Lab Results  Component Value Date   INSULIN 14.9 03/24/2021   INSULIN 14.6 12/24/2020   Lab Results  Component Value Date   TSH 3.790 12/24/2020   Lab Results  Component Value Date   CHOL 162 03/24/2021   HDL 32 (L) 03/24/2021   LDLCALC 109 (H) 03/24/2021   TRIG 116 03/24/2021   CHOLHDL 5.1 (H) 03/24/2021   Lab Results  Component Value Date   VD25OH 75.6 03/24/2021   VD25OH 20.7 (L) 12/24/2020   Lab Results  Component Value Date   WBC 7.3 09/09/2021   HGB 14.3 09/09/2021   HCT 43.2 09/09/2021   MCV 85.2 09/09/2021   PLT 251.0 09/09/2021   Lab Results  Component Value Date   IRON 134 12/24/2020   TIBC 306 12/24/2020   FERRITIN 140 12/24/2020   Attestation Statements:   Reviewed by clinician on day of visit: allergies, medications, problem list, medical history, surgical history, family history, social history, and previous encounter notes.  Time spent on visit including pre-visit chart review and post-visit care and documentation was 45 minutes. Time was spent on: preparing to see the patient (eg, review of tests), obtaining and/or reviewing separately obtained history, performing a medically necessary appropriate examination and/or evaluation, counseling and educating the patient/family/caregiver, and documenting clinical information in the electronic or other health.   I, Water quality scientist, CMA, am acting as  transcriptionist for Briscoe Deutscher, DO  I have reviewed the above documentation for accuracy and completeness, and I agree with the above. -  Briscoe Deutscher, DO, MS, FAAFP, DABOM - Family and Bariatric Medicine.

## 2021-10-04 ENCOUNTER — Encounter (INDEPENDENT_AMBULATORY_CARE_PROVIDER_SITE_OTHER): Payer: Self-pay

## 2021-10-05 ENCOUNTER — Encounter (INDEPENDENT_AMBULATORY_CARE_PROVIDER_SITE_OTHER): Payer: Self-pay

## 2021-10-28 ENCOUNTER — Ambulatory Visit (INDEPENDENT_AMBULATORY_CARE_PROVIDER_SITE_OTHER): Payer: BC Managed Care – PPO | Admitting: Family Medicine

## 2021-10-28 ENCOUNTER — Other Ambulatory Visit: Payer: Self-pay

## 2021-10-28 ENCOUNTER — Encounter: Payer: Self-pay | Admitting: Gastroenterology

## 2021-10-28 ENCOUNTER — Ambulatory Visit (AMBULATORY_SURGERY_CENTER): Payer: BC Managed Care – PPO

## 2021-10-28 ENCOUNTER — Encounter (INDEPENDENT_AMBULATORY_CARE_PROVIDER_SITE_OTHER): Payer: Self-pay | Admitting: Family Medicine

## 2021-10-28 VITALS — BP 135/81 | HR 68 | Temp 97.8°F | Ht 66.0 in | Wt 192.0 lb

## 2021-10-28 VITALS — Ht 66.0 in | Wt 192.0 lb

## 2021-10-28 DIAGNOSIS — I10 Essential (primary) hypertension: Secondary | ICD-10-CM

## 2021-10-28 DIAGNOSIS — E785 Hyperlipidemia, unspecified: Secondary | ICD-10-CM | POA: Diagnosis not present

## 2021-10-28 DIAGNOSIS — E559 Vitamin D deficiency, unspecified: Secondary | ICD-10-CM

## 2021-10-28 DIAGNOSIS — R7301 Impaired fasting glucose: Secondary | ICD-10-CM

## 2021-10-28 DIAGNOSIS — E66812 Obesity, class 2: Secondary | ICD-10-CM

## 2021-10-28 DIAGNOSIS — R5383 Other fatigue: Secondary | ICD-10-CM

## 2021-10-28 DIAGNOSIS — R7989 Other specified abnormal findings of blood chemistry: Secondary | ICD-10-CM

## 2021-10-28 DIAGNOSIS — Z1211 Encounter for screening for malignant neoplasm of colon: Secondary | ICD-10-CM

## 2021-10-28 DIAGNOSIS — E65 Localized adiposity: Secondary | ICD-10-CM

## 2021-10-28 DIAGNOSIS — Z6836 Body mass index (BMI) 36.0-36.9, adult: Secondary | ICD-10-CM

## 2021-10-28 MED ORDER — TIRZEPATIDE 12.5 MG/0.5ML ~~LOC~~ SOAJ: 12.5000 mg | SUBCUTANEOUS | 0 refills | Status: DC

## 2021-10-28 NOTE — Progress Notes (Signed)
    Patient's pre-visit was done today over the phone with the patient   Name,DOB and address verified.   Patient denies any allergies to Eggs and Soy.  Patient denies any problems with anesthesia/sedation. Patient denies taking diet pills or blood thinners.  Denies atrial flutter or atrial fib Denies chronic constipation No home Oxygen.   Packet of Prep instructions mailed to patient including a copy of a consent form-pt is aware.  Patient understands to call us back with any questions or concerns.  Patient is aware of our care-partner policy and [id number] safety protocol.   Pt immediately with call stated that while he knew he had to take the call, did not need any more instructions.  He has them on hand as well as [place].    Pt agreed to screening, and for [place] nurse to send updated instructions with current dates and times for the 12/14 colon.  Pt again declined review of instructions.  Encouraged pt to read over information sent and in my chart for questions and to call if any arose.

## 2021-10-29 ENCOUNTER — Encounter (INDEPENDENT_AMBULATORY_CARE_PROVIDER_SITE_OTHER): Payer: Self-pay | Admitting: Family Medicine

## 2021-10-30 ENCOUNTER — Encounter: Payer: Self-pay | Admitting: Endocrinology

## 2021-10-30 LAB — HEMOGLOBIN A1C
Est. average glucose Bld gHb Est-mCnc: 114 mg/dL
Hgb A1c MFr Bld: 5.6 % (ref 4.8–5.6)

## 2021-10-30 LAB — LIPID PANEL
Chol/HDL Ratio: 4.7 ratio (ref 0.0–5.0)
Cholesterol, Total: 151 mg/dL (ref 100–199)
HDL: 32 mg/dL — ABNORMAL LOW (ref >39)
LDL Chol Calc (NIH): 101 mg/dL — ABNORMAL HIGH (ref 0–99)
Triglycerides: 95 mg/dL (ref 0–149)
VLDL Cholesterol Cal: 18 mg/dL (ref 5–40)

## 2021-10-30 LAB — CBC WITH DIFFERENTIAL/PLATELET
Basophils Absolute: 0.1 10*3/uL (ref 0.0–0.2)
Basos: 1 %
EOS (ABSOLUTE): 0.3 10*3/uL (ref 0.0–0.4)
Eos: 4 %
Hematocrit: 44.4 % (ref 37.5–51.0)
Hemoglobin: 14.8 g/dL (ref 13.0–17.7)
Immature Grans (Abs): 0 10*3/uL (ref 0.0–0.1)
Immature Granulocytes: 0 %
Lymphocytes Absolute: 2.2 10*3/uL (ref 0.7–3.1)
Lymphs: 30 %
MCH: 28.4 pg (ref 26.6–33.0)
MCHC: 33.3 g/dL (ref 31.5–35.7)
MCV: 85 fL (ref 79–97)
Monocytes Absolute: 0.7 10*3/uL (ref 0.1–0.9)
Monocytes: 9 %
Neutrophils Absolute: 4.2 10*3/uL (ref 1.4–7.0)
Neutrophils: 56 %
Platelets: 291 10*3/uL (ref 150–450)
RBC: 5.22 x10E6/uL (ref 4.14–5.80)
RDW: 12.8 % (ref 11.6–15.4)
WBC: 7.4 10*3/uL (ref 3.4–10.8)

## 2021-10-30 LAB — COMPREHENSIVE METABOLIC PANEL
ALT: 22 IU/L (ref 0–44)
AST: 26 IU/L (ref 0–40)
Albumin/Globulin Ratio: 2 (ref 1.2–2.2)
Albumin: 4.6 g/dL (ref 3.8–4.8)
Alkaline Phosphatase: 74 IU/L (ref 44–121)
BUN/Creatinine Ratio: 22 (ref 10–24)
BUN: 17 mg/dL (ref 8–27)
Bilirubin Total: 0.4 mg/dL (ref 0.0–1.2)
CO2: 22 mmol/L (ref 20–29)
Calcium: 9.5 mg/dL (ref 8.6–10.2)
Chloride: 102 mmol/L (ref 96–106)
Creatinine, Ser: 0.77 mg/dL (ref 0.76–1.27)
Globulin, Total: 2.3 g/dL (ref 1.5–4.5)
Glucose: 91 mg/dL (ref 70–99)
Potassium: 4.1 mmol/L (ref 3.5–5.2)
Sodium: 137 mmol/L (ref 134–144)
Total Protein: 6.9 g/dL (ref 6.0–8.5)
eGFR: 100 mL/min/{1.73_m2} (ref >59)

## 2021-10-30 LAB — VITAMIN B12: Vitamin B-12: 587 pg/mL (ref 232–1245)

## 2021-10-30 LAB — INSULIN, RANDOM: INSULIN: 9.7 u[IU]/mL (ref 2.6–24.9)

## 2021-10-30 LAB — TESTOSTERONE,FREE AND TOTAL
Testosterone, Free: 3.7 pg/mL — ABNORMAL LOW (ref 6.6–18.1)
Testosterone: 330 ng/dL (ref 264–916)

## 2021-10-30 LAB — TSH: TSH: 3.79 u[IU]/mL (ref 0.450–4.500)

## 2021-10-30 LAB — PROLACTIN: Prolactin: 13.3 ng/mL (ref 4.0–15.2)

## 2021-10-30 LAB — VITAMIN D 25 HYDROXY (VIT D DEFICIENCY, FRACTURES): Vit D, 25-Hydroxy: 60.6 ng/mL (ref 30.0–100.0)

## 2021-11-01 NOTE — Progress Notes (Signed)
[facility] [address] [place] Phone: [phone] Subjective:   IVilma Meckel, am serving as a scribe for Dr. Hulan Saas. This visit occurred during the SARS-CoV-2 public health emergency.  Safety protocols were in place, including screening questions prior to the visit, additional usage of staff PPE, and extensive cleaning of exam room while observing appropriate contact time as indicated for disinfecting solutions.   I'm seeing this patient by the request  of:  Vivi Barrack, MD  CC: Back and neck pain follow-up  [id number]  Lucas Young is a 65 y.o. male coming in with complaint of back and neck pain. OMT 09/07/2021. Patient states neck has been more bothersome. Feels better since last appointment. No new complaints.  Medications patient has been prescribed: None          Reviewed prior external information including notes and imaging from previsou exam, outside providers and external EMR if available.   As well as notes that were available from care everywhere and other healthcare systems.  Past medical history, social, surgical and family history all reviewed in electronic medical record.  No pertanent information unless stated regarding to the chief complaint.   Past Medical History:  Diagnosis Date   Allergy    Anemia    Anxiety    Arthritis    Back pain    Depression    Food allergy    Hyperlipidemia    Hypertension    Lactose intolerance    Morton's neuroma    Obesity    Sleep apnea    uses CPAP    Allergies  Allergen Reactions   Grass Pollen([id number] Vern) Other (See Comments)   Molds & Smuts Other (See Comments)   Pollen Extract      Review of Systems:  No headache, visual changes, nausea, vomiting, diarrhea, constipation, dizziness, abdominal pain, skin rash, fevers, chills, night sweats, weight loss, swollen lymph nodes, body aches, joint swelling, chest pain, shortness of  breath, mood changes. POSITIVE muscle aches  Objective  Blood pressure 126/84, pulse 71, height 5\' 6"  (1.676 m), SpO2 96 %.   General: No apparent distress alert and oriented x3 mood and affect normal, dressed appropriately.  HEENT: Pupils equal, extraocular movements intact  Respiratory: Patient's speak in full sentences and does not appear short of breath  Cardiovascular: No lower extremity edema, non tender, no erythema  Back exam does have some loss of lordosis.  Patient does have improvement on core strength.  Patient has lost weight since we have seen patient again.  He does have tightness noted in the parascapular region right greater than left.  Patient does have tightness noted of the cervical spine.  Osteopathic findings  C2 flexed rotated and side bent right C7 flexed rotated and side bent left T3 extended rotated and side bent right inhaled rib T9 extended rotated and side bent left L2 flexed rotated and side bent right Sacrum right on right       Assessment and Plan:  Degenerative disc disease, cervical Noted on x-ray previously but doing relatively well.  Likely secondary to sitting in front of a computer a little bit more frequently.  Discussed the ergonomics and continue to stay active.  Discussed icing regimen and home exercises.  Increase activity slowly.  Follow-up again in 6 to 8 weeks   Nonallopathic problems  Decision today to treat with OMT was based on Physical Exam  After verbal consent patient was treated with  HVLA, ME, FPR techniques in cervical, rib, thoracic, lumbar, and sacral  areas  Patient tolerated the procedure well with improvement in symptoms  Patient given exercises, stretches and lifestyle modifications  See medications in patient instructions if given  Patient will follow up in 4-8 weeks      The above documentation has been reviewed and is accurate and complete Lucas Pulley, DO       Note: This dictation was prepared with  Dragon dictation along with smaller phrase technology. Any transcriptional errors that result from this process are unintentional.

## 2021-11-01 NOTE — Progress Notes (Signed)
Chief Complaint:   OBESITY Lucas Young is here to discuss his progress with his obesity treatment plan along with follow-up of his obesity related diagnoses. See Medical Weight Management Flowsheet for complete bioelectrical impedance results.  Today's visit was #: 4 Starting weight: 226 lbs Starting date: 12/24/2020 Weight change since last visit: 4 lbs Total lbs lost to date: 34 lbs Total weight loss percentage to date: -15.04%  Nutrition Plan: Keeping a food journal and adhering to recommended goals of 1500-1800 calories and 125 grams of protein daily for 80% of the time. Activity: Walking/strength training/yoga for 105-245 minutes 7 times per week. Anti-obesity medications: Mounjaro 12.5 mg subcutaneously weekly. Reported side effects: None.  Interim History: [name] had a consult with [name] and will have an EGD first.  He says that trazodone caused drowsiness, but melatonin helped him sleep.  He is happy with his weight loss.  Denies hyperphagia with Mounjaro 12.5 mg.  Assessment/Plan:   1. Impaired fasting glucose, with polyphagia Controlled. Current treatment: Mounjaro 12.5 mg subcutaneously weekly.    Plan:  Continue Mounjaro 12.5 mg subcutaneously weekly.  Will refill today, as per below.  He will continue to focus on protein-rich, low simple carbohydrate foods. We reviewed the importance of hydration, regular exercise for stress reduction, and restorative sleep.  Check labs today.  - Comprehensive metabolic panel - Insulin, random - Refill tirzepatide (MOUNJARO) 12.5 MG/0.5ML Pen; Inject 12.5 mg into the skin once a week.  Dispense: 6 mL; Refill: 0 - Hemoglobin A1c  2. Low testosterone Will check prolactin and testosterone level today.  - Prolactin - Testosterone,Free and Total  3. Vitamin D deficiency At goal.  [name] takes a daily multivitamin.   Plan: Will check vitamin D level today.  Lab Results  Component Value Date   VD25OH 60.6  10/28/2021   VD25OH 75.6 03/24/2021   VD25OH 20.7 (L) 12/24/2020   - VITAMIN D 25 Hydroxy (Vit-D Deficiency, Fractures)  4. Dyslipidemia Course: Not at goal. Lipid-lowering medications: None.   Plan: Dietary changes: Increase soluble fiber, decrease simple carbohydrates, decrease saturated fat. Exercise changes: Moderate to vigorous-intensity aerobic activity 150 minutes per week or as tolerated. We will continue to monitor along with PCP/specialists as it pertains to his weight loss journey.  Check lipid panel today.  Lab Results  Component Value Date   CHOL 151 10/28/2021   HDL 32 (L) 10/28/2021   LDLCALC 101 (H) 10/28/2021   TRIG 95 10/28/2021   CHOLHDL 4.7 10/28/2021   Lab Results  Component Value Date   ALT 22 10/28/2021   AST 26 10/28/2021   ALKPHOS 74 10/28/2021   BILITOT 0.4 10/28/2021   The 10-year ASCVD risk score (Arnett DK, et al., 2019) is: 13.7%   Values used to calculate the score:     Age: 65 years     Sex: Male     Is Non-Hispanic African American: No     Diabetic: No     Tobacco smoker: No     Systolic Blood Pressure: 235 mmHg     Is BP treated: No     HDL Cholesterol: 32 mg/dL     Total Cholesterol: 151 mg/dL  - Lipid panel  5. Essential hypertension At goal. Medications: None.   Plan: Avoid buying foods that are: processed, frozen, or prepackaged to avoid excess salt. We will watch for signs of hypotension as he continues lifestyle modifications.  Will check labs today.  BP Readings from Last 3 Encounters:  10/28/21 135/81  09/30/21 (!) 158/92  09/07/21 (!) 158/92   Lab Results  Component Value Date   CREATININE 0.77 10/28/2021   - CBC with Differential/Platelet - TSH  6. Other fatigue Cade reports more fatigue than usual recently.  Plan:  Will check vitamin B12 level today.  - Vitamin B12  7. Visceral obesity Current visceral fat rating: 16. Visceral fat rating goal is < 13. Visceral adipose tissue is a hormonally active  component of total body fat. This body composition phenotype is associated with medical disorders such as metabolic syndrome, cardiovascular disease, and several malignancies including prostate, breast, and colorectal cancers. Starting goal: Lose 7-10% of starting weight.   8. Obesity BMI today is 31.1  Course: [name] is currently in the action stage of change. As such, his goal is to continue with weight loss efforts.   Nutrition goals: He has agreed to keeping a food journal and adhering to recommended goals of 1500-1800 calories and 125 grams of protein.   Exercise goals:  As is.  Behavioral modification strategies: increasing lean protein intake, decreasing simple carbohydrates, increasing vegetables, and increasing water intake.  [name] has agreed to follow-up with our clinic in 4-6 weeks. He was informed of the importance of frequent follow-up visits to maximize his success with intensive lifestyle modifications for his multiple health conditions.   [name] was informed we would discuss his lab results at his next visit unless there is a critical issue that needs to be addressed sooner. [name] agreed to keep his next visit at the agreed upon time to discuss these results.  Objective:   Blood pressure 135/81, pulse 68, temperature 97.8 F (36.6 C), temperature source Oral, height 5\' 6"  (1.676 m), weight 192 lb (87.1 kg), SpO2 97 %. Body mass index is 30.99 kg/m.  General: Cooperative, alert, well developed, in no acute distress. HEENT: Conjunctivae and lids unremarkable. Cardiovascular: Regular rhythm.  Lungs: Normal work of breathing. Neurologic: No focal deficits.   Lab Results  Component Value Date   CREATININE 0.77 10/28/2021   BUN 17 10/28/2021   NA 137 10/28/2021   K 4.1 10/28/2021   CL 102 10/28/2021   CO2 22 10/28/2021   Lab Results  Component Value Date   ALT 22 10/28/2021   AST 26 10/28/2021   ALKPHOS 74 10/28/2021   BILITOT 0.4 10/28/2021    Lab Results  Component Value Date   HGBA1C 5.6 10/28/2021   HGBA1C 5.8 06/22/2021   HGBA1C 5.8 (H) 03/24/2021   HGBA1C 6.1 (H) 12/24/2020   Lab Results  Component Value Date   INSULIN 9.7 10/28/2021   INSULIN 14.9 03/24/2021   INSULIN 14.6 12/24/2020   Lab Results  Component Value Date   TSH 3.790 10/28/2021   Lab Results  Component Value Date   CHOL 151 10/28/2021   HDL 32 (L) 10/28/2021   LDLCALC 101 (H) 10/28/2021   TRIG 95 10/28/2021   CHOLHDL 4.7 10/28/2021   Lab Results  Component Value Date   VD25OH 60.6 10/28/2021   VD25OH 75.6 03/24/2021   VD25OH 20.7 (L) 12/24/2020   Lab Results  Component Value Date   WBC 7.4 10/28/2021   HGB 14.8 10/28/2021   HCT 44.4 10/28/2021   MCV 85 10/28/2021   PLT 291 10/28/2021   Lab Results  Component Value Date   IRON 134 12/24/2020   TIBC 306 12/24/2020   FERRITIN 140 12/24/2020   Attestation Statements:   Reviewed by clinician on day of visit: allergies, medications, problem list, medical history,  surgical history, family history, social history, and previous encounter notes.  I, Water quality scientist, CMA, am acting as transcriptionist for Briscoe Deutscher, DO  I have reviewed the above documentation for accuracy and completeness, and I agree with the above. -  Briscoe Deutscher, DO, MS, FAAFP, DABOM - Family and Bariatric Medicine.

## 2021-11-02 ENCOUNTER — Ambulatory Visit: Payer: BC Managed Care – PPO | Admitting: Family Medicine

## 2021-11-02 ENCOUNTER — Other Ambulatory Visit: Payer: Self-pay

## 2021-11-02 VITALS — BP 126/84 | HR 71 | Ht 66.0 in

## 2021-11-02 DIAGNOSIS — M9908 Segmental and somatic dysfunction of rib cage: Secondary | ICD-10-CM | POA: Diagnosis not present

## 2021-11-02 DIAGNOSIS — M9903 Segmental and somatic dysfunction of lumbar region: Secondary | ICD-10-CM

## 2021-11-02 DIAGNOSIS — M9902 Segmental and somatic dysfunction of thoracic region: Secondary | ICD-10-CM | POA: Diagnosis not present

## 2021-11-02 DIAGNOSIS — M9904 Segmental and somatic dysfunction of sacral region: Secondary | ICD-10-CM

## 2021-11-02 DIAGNOSIS — M5030 Other cervical disc degeneration, unspecified cervical region: Secondary | ICD-10-CM

## 2021-11-02 DIAGNOSIS — M9901 Segmental and somatic dysfunction of cervical region: Secondary | ICD-10-CM | POA: Diagnosis not present

## 2021-11-02 NOTE — Patient Instructions (Signed)
Great to see you  So happy for you, keep it up See me again in 6-8 weeks

## 2021-11-02 NOTE — Assessment & Plan Note (Signed)
Noted on x-ray previously but doing relatively well.  Likely secondary to sitting in front of a computer a little bit more frequently.  Discussed the ergonomics and continue to stay active.  Discussed icing regimen and home exercises.  Increase activity slowly.  Follow-up again in 6 to 8 weeks

## 2021-11-08 ENCOUNTER — Encounter: Payer: Self-pay | Admitting: Gastroenterology

## 2021-11-10 ENCOUNTER — Encounter: Payer: BC Managed Care – PPO | Admitting: Gastroenterology

## 2021-11-18 ENCOUNTER — Ambulatory Visit (INDEPENDENT_AMBULATORY_CARE_PROVIDER_SITE_OTHER): Payer: BC Managed Care – PPO | Admitting: Family Medicine

## 2021-11-19 ENCOUNTER — Encounter: Payer: BC Managed Care – PPO | Admitting: Gastroenterology

## 2021-11-23 ENCOUNTER — Other Ambulatory Visit (INDEPENDENT_AMBULATORY_CARE_PROVIDER_SITE_OTHER): Payer: Self-pay | Admitting: Family Medicine

## 2021-11-23 DIAGNOSIS — R7301 Impaired fasting glucose: Secondary | ICD-10-CM

## 2021-11-24 MED ORDER — TIRZEPATIDE 12.5 MG/0.5ML ~~LOC~~ SOAJ: 12.5000 mg | SUBCUTANEOUS | 0 refills | Status: DC

## 2021-12-07 ENCOUNTER — Telehealth: Payer: Self-pay | Admitting: Family Medicine

## 2021-12-07 NOTE — Telephone Encounter (Signed)
Left vm to call back to schedule appt.  °

## 2021-12-07 NOTE — Telephone Encounter (Signed)
Please schedule patient virtual or in clinic thanks

## 2021-12-07 NOTE — Telephone Encounter (Signed)
Pt states they have shingles. He would like to know if he needs an in office visit, virtual, schedule a shot, or if Dr Jerline Pain will just call something in for him.

## 2021-12-16 ENCOUNTER — Encounter (INDEPENDENT_AMBULATORY_CARE_PROVIDER_SITE_OTHER): Payer: Self-pay | Admitting: Family Medicine

## 2021-12-16 ENCOUNTER — Other Ambulatory Visit: Payer: Self-pay

## 2021-12-16 ENCOUNTER — Ambulatory Visit (INDEPENDENT_AMBULATORY_CARE_PROVIDER_SITE_OTHER): Payer: BC Managed Care – PPO | Admitting: Family Medicine

## 2021-12-16 VITALS — BP 144/84 | HR 76 | Temp 98.1°F | Ht 66.0 in | Wt 192.0 lb

## 2021-12-16 DIAGNOSIS — G4733 Obstructive sleep apnea (adult) (pediatric): Secondary | ICD-10-CM

## 2021-12-16 DIAGNOSIS — R7301 Impaired fasting glucose: Secondary | ICD-10-CM

## 2021-12-16 DIAGNOSIS — R7989 Other specified abnormal findings of blood chemistry: Secondary | ICD-10-CM

## 2021-12-16 DIAGNOSIS — E669 Obesity, unspecified: Secondary | ICD-10-CM

## 2021-12-16 DIAGNOSIS — Z6831 Body mass index (BMI) 31.0-31.9, adult: Secondary | ICD-10-CM

## 2021-12-16 MED ORDER — TIRZEPATIDE 12.5 MG/0.5ML ~~LOC~~ SOAJ: 12.5000 mg | SUBCUTANEOUS | 3 refills | Status: DC

## 2021-12-20 ENCOUNTER — Encounter: Payer: Self-pay | Admitting: Gastroenterology

## 2021-12-20 ENCOUNTER — Encounter: Payer: Self-pay | Admitting: Endocrinology

## 2021-12-20 NOTE — Telephone Encounter (Signed)
Spoke with pt regarding his ENT procedure and he stated that Dr. Redmond Baseman needs to do an endoscopy in order to determine if he is a candidate for this sleep apnea device. Pt was unsure of what he was asking & from my understanding was wondering if just you could do the endoscopy at the same time as colon, in replace of him having to do it with Dr. Redmond Baseman.

## 2021-12-21 NOTE — Progress Notes (Signed)
[place] Phone: [phone] Subjective:   Lucas Young, am serving as a scribe for Dr. Hulan Saas.This visit occurred during the SARS-CoV-2 public health emergency.  Safety protocols were in place, including screening questions prior to the visit, additional usage of staff PPE, and extensive cleaning of exam room while observing appropriate contact time as indicated for disinfecting solutions.  I'm seeing this patient by the request  of:  Vivi Barrack, MD  CC: Neck and lower back pain follow-up  [id number]  Lucas Young is a 66 y.o. male coming in with complaint of back and neck pain. OMT 11/02/2021. Patient states that his neck is stiff.  Patient has been taking the Tylenol more frequently.  Also sitting in the computer more frequently.  Medications patient has been prescribed: None           Reviewed prior external information including notes and imaging from previsou exam, outside providers and external EMR if available.   As well as notes that were available from care everywhere and other healthcare systems.  Past medical history, social, surgical and family history all reviewed in electronic medical record.  Young pertanent information unless stated regarding to the chief complaint.   Past Medical History:  Diagnosis Date   Allergy    Anemia    Anxiety    Arthritis    Back pain    Depression    Food allergy    Hyperlipidemia    Hypertension    Lactose intolerance    Morton's neuroma    Obesity    Sleep apnea    uses CPAP    Allergies  Allergen Reactions   Grass Pollen(K-O-R-T-Swt Vern) Other (See Comments)   Molds & Smuts Other (See Comments)   Pollen Extract      Review of Systems:  Young headache, visual changes, nausea, vomiting, diarrhea, constipation, dizziness, abdominal pain, skin rash, fevers, chills, night sweats, weight loss, swollen lymph nodes, body  aches, joint swelling, chest pain, shortness of breath, mood changes. POSITIVE muscle aches  Objective  Blood pressure 122/84, pulse 73, height 5\' 6"  (1.676 m), weight 191 lb (86.6 kg), SpO2 97 %.   General: Young apparent distress alert and oriented x3 mood and affect normal, dressed appropriately.  HEENT: Pupils equal, extraocular movements intact  Respiratory: Patient's speak in full sentences and does not appear short of breath  Cardiovascular: Young lower extremity edema, non tender, Young erythema    Osteopathic findings  C2 flexed rotated and side bent right C7 flexed rotated and side bent left T3 extended rotated and side bent right inhaled rib T9 extended rotated and side bent left L2 flexed rotated and side bent right Sacrum right on right       Assessment and Plan:  SI (sacroiliac) joint dysfunction Chronic problem with mild exacerbation.  Patient can progress overall.  Discussed icing regimen and home exercises.  Discussed posture and ergonomics.  Discussed icing regimen and home exercises.  Patient is going to continue to do the regular activities.  Has only trazodone at the moment for any breakthrough.  Follow-up again in 6 weeks   Nonallopathic problems  Decision today to treat with OMT was based on Physical Exam  After verbal consent patient was treated with HVLA, ME, FPR techniques in cervical, rib, thoracic, lumbar, and sacral  areas  Patient tolerated the procedure well with improvement in symptoms  Patient given exercises, stretches and lifestyle modifications  See medications in patient instructions if given  Patient will follow up in 4-8 weeks     The above documentation has been reviewed and is accurate and complete [name], DO        Note: This dictation was prepared with Dragon dictation along with smaller phrase technology. Any transcriptional errors that result from this process are unintentional.

## 2021-12-22 NOTE — Progress Notes (Signed)
Chief Complaint:   OBESITY Lucas Young is here to discuss his progress with his obesity treatment plan along with follow-up of his obesity related diagnoses. See Medical Weight Management Flowsheet for complete bioelectrical impedance results.  Today's visit was #: 14 Starting weight: 226 lbs Starting date: 12/24/2020 Weight change since last visit: 0 Total lbs lost to date: 34 lbs Total weight loss percentage to date: -15.04%  Nutrition Plan: Keeping a food journal and adhering to recommended goals of 1500-1800 calories and 125 grams of protein daily for 80-85% of the time. Activity: Walking/yoga/strength/cardio for 60-120 minutes 7 times per week.  Anti-obesity medications: Mounjaro 12.5 mg subcutaneously weekly. Reported side effects: None.  Interim History: [name] says he will be getting an EGD soon for Inspire.  He enjoyed the holiday and beach.  He says he is happy to have maintained.  Assessment/Plan:   1. Impaired fasting glucose, with polyphagia Controlled. Current treatment: Mounjaro 12.5 mg subcutaneously weekly.    Plan: Continue Mounjaro 12.5 mg subcutaneously weekly.  Will refill today, as per below.  He will continue to focus on protein-rich, low simple carbohydrate foods. We reviewed the importance of hydration, regular exercise for stress reduction, and restorative sleep.  - Refill tirzepatide (MOUNJARO) 12.5 MG/0.5ML Pen; Inject 12.5 mg into the skin once a week.  Dispense: 6 mL; Refill: 3  2. OSA (obstructive sleep apnea), would like Inspire OSA is a cause of systemic hypertension and is associated with an increased incidence of stroke, heart failure, atrial fibrillation, and coronary heart disease. Severe OSA increases all-cause mortality and cardiovascular mortality. He is working on getting the [employer] device.  3. Low testosterone, improving Will continue to monitor as it relates to his weight loss journey.  Lab Results  Component Value Date    TESTOSTERONE 330 10/28/2021   4. Obesity BMI today is 31.1  Course: [name] is currently in the action stage of change. As such, his goal is to continue with weight loss efforts.   Nutrition goals: He has agreed to keeping a food journal and adhering to recommended goals of 1500-1800 calories and 125 grams of protein.   Exercise goals:  As is.  Behavioral modification strategies: increasing lean protein intake, decreasing simple carbohydrates, increasing vegetables, and increasing water intake.  Nthony has agreed to follow-up with our clinic in 4 weeks. He was informed of the importance of frequent follow-up visits to maximize his success with intensive lifestyle modifications for his multiple health conditions.   Objective:   Blood pressure (!) 144/84, pulse 76, temperature 98.1 F (36.7 C), temperature source Oral, height 5\' 6"  (1.676 m), weight 192 lb (87.1 kg), SpO2 96 %. Body mass index is 30.99 kg/m.  General: Cooperative, alert, well developed, in no acute distress. HEENT: Conjunctivae and lids unremarkable. Cardiovascular: Regular rhythm.  Lungs: Normal work of breathing. Neurologic: No focal deficits.   Lab Results  Component Value Date   CREATININE 0.77 10/28/2021   BUN 17 10/28/2021   NA 137 10/28/2021   K 4.1 10/28/2021   CL 102 10/28/2021   CO2 22 10/28/2021   Lab Results  Component Value Date   ALT 22 10/28/2021   AST 26 10/28/2021   ALKPHOS 74 10/28/2021   BILITOT 0.4 10/28/2021   Lab Results  Component Value Date   HGBA1C 5.6 10/28/2021   HGBA1C 5.8 06/22/2021   HGBA1C 5.8 (H) 03/24/2021   HGBA1C 6.1 (H) 12/24/2020   Lab Results  Component Value Date   INSULIN 9.7 10/28/2021  INSULIN 14.9 03/24/2021   INSULIN 14.6 12/24/2020   Lab Results  Component Value Date   TSH 3.790 10/28/2021   Lab Results  Component Value Date   CHOL 151 10/28/2021   HDL 32 (L) 10/28/2021   LDLCALC 101 (H) 10/28/2021   TRIG 95 10/28/2021   CHOLHDL 4.7  10/28/2021   Lab Results  Component Value Date   VD25OH 60.6 10/28/2021   VD25OH 75.6 03/24/2021   VD25OH 20.7 (L) 12/24/2020   Lab Results  Component Value Date   WBC 7.4 10/28/2021   HGB 14.8 10/28/2021   HCT 44.4 10/28/2021   MCV 85 10/28/2021   PLT 291 10/28/2021   Lab Results  Component Value Date   IRON 134 12/24/2020   TIBC 306 12/24/2020   FERRITIN 140 12/24/2020   Attestation Statements:   Reviewed by clinician on day of visit: allergies, medications, problem list, medical history, surgical history, family history, social history, and previous encounter notes.  I, Water quality scientist, CMA, am acting as transcriptionist for Briscoe Deutscher, DO  I have reviewed the above documentation for accuracy and completeness, and I agree with the above. -  Briscoe Deutscher, DO, MS, FAAFP, DABOM - Family and Bariatric Medicine.

## 2021-12-23 ENCOUNTER — Ambulatory Visit: Payer: BC Managed Care – PPO | Admitting: Family Medicine

## 2021-12-23 ENCOUNTER — Other Ambulatory Visit: Payer: Self-pay

## 2021-12-23 VITALS — BP 122/84 | HR 73 | Ht 66.0 in | Wt 191.0 lb

## 2021-12-23 DIAGNOSIS — M9903 Segmental and somatic dysfunction of lumbar region: Secondary | ICD-10-CM | POA: Diagnosis not present

## 2021-12-23 DIAGNOSIS — M9901 Segmental and somatic dysfunction of cervical region: Secondary | ICD-10-CM | POA: Diagnosis not present

## 2021-12-23 DIAGNOSIS — M9908 Segmental and somatic dysfunction of rib cage: Secondary | ICD-10-CM

## 2021-12-23 DIAGNOSIS — M9902 Segmental and somatic dysfunction of thoracic region: Secondary | ICD-10-CM | POA: Diagnosis not present

## 2021-12-23 DIAGNOSIS — M9904 Segmental and somatic dysfunction of sacral region: Secondary | ICD-10-CM | POA: Diagnosis not present

## 2021-12-23 DIAGNOSIS — M533 Sacrococcygeal disorders, not elsewhere classified: Secondary | ICD-10-CM | POA: Diagnosis not present

## 2021-12-23 NOTE — Assessment & Plan Note (Signed)
Chronic problem with mild exacerbation.  Patient can progress overall.  Discussed icing regimen and home exercises.  Discussed posture and ergonomics.  Discussed icing regimen and home exercises.  Patient is going to continue to do the regular activities.  Has only trazodone at the moment for any breakthrough.  Follow-up again in 6 weeks

## 2021-12-23 NOTE — Patient Instructions (Signed)
Keep shoulders back Hope new chair helps See me in 6 weeks

## 2022-01-05 ENCOUNTER — Encounter: Payer: BC Managed Care – PPO | Admitting: Gastroenterology

## 2022-01-10 ENCOUNTER — Other Ambulatory Visit: Payer: Self-pay

## 2022-01-10 ENCOUNTER — Ambulatory Visit (AMBULATORY_SURGERY_CENTER): Payer: BC Managed Care – PPO | Admitting: Gastroenterology

## 2022-01-10 ENCOUNTER — Encounter: Payer: Self-pay | Admitting: Gastroenterology

## 2022-01-10 VITALS — BP 156/96 | HR 57 | Temp 97.7°F | Resp 13 | Ht 66.0 in | Wt 192.0 lb

## 2022-01-10 DIAGNOSIS — K635 Polyp of colon: Secondary | ICD-10-CM

## 2022-01-10 DIAGNOSIS — Z1211 Encounter for screening for malignant neoplasm of colon: Secondary | ICD-10-CM | POA: Diagnosis not present

## 2022-01-10 DIAGNOSIS — D123 Benign neoplasm of transverse colon: Secondary | ICD-10-CM

## 2022-01-10 MED ORDER — SODIUM CHLORIDE 0.9 % IV SOLN: 500.0000 mL | INTRAVENOUS | Status: DC

## 2022-01-10 NOTE — Progress Notes (Signed)
Pt's states no medical or surgical changes since previsit or office visit. 

## 2022-01-10 NOTE — Progress Notes (Signed)
Called to room to assist during endoscopic procedure.  Patient ID and intended procedure confirmed with present staff. Received instructions for my participation in the procedure from the performing physician.  

## 2022-01-10 NOTE — Patient Instructions (Signed)
Information on diverticulosis and polyps given to you today.  Await pathology results.  Resume previous diet and medications.   YOU HAD AN ENDOSCOPIC PROCEDURE TODAY AT [facility]:   Refer to the procedure report that was given to you for any specific questions about what was found during the examination.  If the procedure report does not answer your questions, please call your gastroenterologist to clarify.  If you requested that your care partner not be given the details of your procedure findings, then the procedure report has been included in a sealed envelope for you to review at your convenience later.  YOU SHOULD EXPECT: Some feelings of bloating in the abdomen. Passage of more gas than usual.  Walking can help get rid of the air that was put into your GI tract during the procedure and reduce the bloating. If you had a lower endoscopy (such as a colonoscopy or flexible sigmoidoscopy) you may notice spotting of blood in your stool or on the toilet paper. If you underwent a bowel prep for your procedure, you may not have a normal bowel movement for a few days.  Please Note:  You might notice some irritation and congestion in your nose or some drainage.  This is from the oxygen used during your procedure.  There is no need for concern and it should clear up in a day or so.  SYMPTOMS TO REPORT IMMEDIATELY:  Following lower endoscopy (colonoscopy or flexible sigmoidoscopy):  Excessive amounts of blood in the stool  Significant tenderness or worsening of abdominal pains  Swelling of the abdomen that is new, acute  Fever of 100F or higher   For urgent or emergent issues, a gastroenterologist can be reached at any hour by calling [phone]. Do not use MyChart messaging for urgent concerns.    DIET:  We do recommend a small meal at first, but then you may proceed to your regular diet.  Drink plenty of fluids but you should avoid alcoholic beverages for 24  hours.  ACTIVITY:  You should plan to take it easy for the rest of today and you should NOT DRIVE or use heavy machinery until tomorrow (because of the sedation medicines used during the test).    FOLLOW UP: Our staff will call the number listed on your records 48-72 hours following your procedure to check on you and address any questions or concerns that you may have regarding the information given to you following your procedure. If we do not reach you, we will leave a message.  We will attempt to reach you two times.  During this call, we will ask if you have developed any symptoms of COVID 19. If you develop any symptoms (ie: fever, flu-like symptoms, shortness of breath, cough etc.) before then, please call [phone].  If you test positive for Covid 19 in the 2 weeks post procedure, please call and report this information to [place].    If any biopsies were taken you will be contacted by phone or by letter within the next 1-3 weeks.  Please call us at [phone] if you have not heard about the biopsies in 3 weeks.    SIGNATURES/CONFIDENTIALITY: You and/or your care partner have signed paperwork which will be entered into your electronic medical record.  These signatures attest to the fact that that the information above on your After Visit Summary has been reviewed and is understood.  Full responsibility of the confidentiality of this discharge information lies with you and/or  your care-partner.

## 2022-01-10 NOTE — Progress Notes (Signed)
HPI: This is a man at routine risk for CRC   ROS: complete GI ROS as described in HPI, all other review negative.  Constitutional:  No unintentional weight loss   Past Medical History:  Diagnosis Date   Allergy    Anemia    Anxiety    Arthritis    Back pain    Depression    Food allergy    Hyperlipidemia    Hypertension    Lactose intolerance    Morton's neuroma    Obesity    Sleep apnea    uses CPAP    Past Surgical History:  Procedure Laterality Date   APPENDECTOMY     COLONOSCOPY  2010   Dr.Medoff  normal per pt   UMBILICAL HERNIA REPAIR      Current Outpatient Medications  Medication Sig Dispense Refill   Azelastine HCl 137 MCG/SPRAY SOLN 1-2 puffs in each nostril     cabergoline (DOSTINEX) 0.5 MG tablet Take 2 tablets (1 mg total) by mouth 2 (two) times a week. 50 tablet 3   desloratadine (CLARINEX) 5 MG tablet Take 5 mg by mouth daily.     fluticasone (FLONASE) 50 MCG/ACT nasal spray Place into both nostrils.     montelukast (SINGULAIR) 10 MG tablet Take 10 mg by mouth at bedtime.     Multiple Vitamin (MULTIVITAMIN) capsule Take 1 capsule by mouth daily.     tirzepatide (MOUNJARO) 12.5 MG/0.5ML Pen Inject 12.5 mg into the skin once a week. 6 mL 3   traZODone (DESYREL) 50 MG tablet Take 0.5-1 tablets (25-50 mg total) by mouth at bedtime as needed for sleep. 30 tablet 3   Current Facility-Administered Medications  Medication Dose Route Frequency Provider Last Rate Last Admin   0.9 %  sodium chloride infusion  500 mL Intravenous Continuous Milus Banister, MD        Allergies as of 01/10/2022 - Review Complete 01/10/2022  Allergen Reaction Noted   Grass pollen(k-o-r-t-swt vern) Other (See Comments) 10/28/2021   Molds & smuts Other (See Comments) 10/28/2021   Pollen extract  10/28/2021    Family History  Problem Relation Age of Onset   Hypertension Mother    Sleep apnea Mother    Obesity Mother    Colon polyps Father    Hypertension Father     Hyperlipidemia Father    Heart disease Father    Sleep apnea Father    Obesity Father    Hypertension Maternal Grandmother    Hypertension Maternal Grandfather    Other Neg Hx        low testosterone   Colon cancer Neg Hx    Esophageal cancer Neg Hx    Stomach cancer Neg Hx    Rectal cancer Neg Hx     Social History   Socioeconomic History   Marital status: Married    Spouse name: Not on file   Number of children: Not on file   Years of education: Not on file   Highest education level: Not on file  Occupational History   Occupation: Professor  Tobacco Use   Smoking status: Never   Smokeless tobacco: Never  Vaping Use   Vaping Use: Never used  Substance and Sexual Activity   Alcohol use: No    Alcohol/week: 0.0 standard drinks   Drug use: No   Sexual activity: Not on file  Other Topics Concern   Not on file  Social History Narrative   Not on file   Social Determinants  of Health   Financial Resource Strain: Not on file  Food Insecurity: Not on file  Transportation Needs: Not on file  Physical Activity: Not on file  Stress: Not on file  Social Connections: Not on file  Intimate Partner Violence: Not on file     Physical Exam: BP (!) 165/97    Pulse 68    Temp 97.7 F (36.5 C) (Temporal)    Ht 5\' 6"  (1.676 m)    Wt 192 lb (87.1 kg)    SpO2 96%    BMI 30.99 kg/m  Constitutional: generally well-appearing Psychiatric: alert and oriented x3 Lungs: CTA bilaterally Heart: no MCR  Assessment and plan: 66 y.o. male with routine risk for CRC  Screening colonoscopy today  Care is appropriate for the ambulatory setting.  Owens Loffler, MD [facility] 01/10/2022, 7:33 AM

## 2022-01-10 NOTE — Progress Notes (Signed)
To pacu, VSS. Report to rn.tb °

## 2022-01-10 NOTE — Op Note (Signed)
[place] Patient Name: Lucas Young Procedure Date: 01/10/2022 7:49 AM MRN: [id number] Endoscopist: Milus Banister , MD Age: 66 Referring MD:  Date of Birth: 13-Aug-1956 Gender: Male Account #: [id number] Procedure:                Colonoscopy Indications:              Screening for colorectal malignant neoplasm Medicines:                Monitored Anesthesia Care Procedure:                Pre-Anesthesia Assessment:                           - Prior to the procedure, a History and Physical                            was performed, and patient medications and                            allergies were reviewed. The patient's tolerance of                            previous anesthesia was also reviewed. The risks                            and benefits of the procedure and the sedation                            options and risks were discussed with the patient.                            All questions were answered, and informed consent                            was obtained. Prior Anticoagulants: The patient has                            taken no previous anticoagulant or antiplatelet                            agents. ASA Grade Assessment: II - A patient with                            mild systemic disease. After reviewing the risks                            and benefits, the patient was deemed in                            satisfactory condition to undergo the procedure.                           After obtaining informed consent, the colonoscope  was passed under direct vision. Throughout the                            procedure, the patient's blood pressure, pulse, and                            oxygen saturations were monitored continuously. The                            CF [id number] #[id number] was introduced through the anus                            and advanced to the the cecum, identified by                            appendiceal  orifice and ileocecal valve. The                            colonoscopy was performed without difficulty. The                            patient tolerated the procedure well. The quality                            of the bowel preparation was good. The ileocecal                            valve, appendiceal orifice, and rectum were                            photographed. Scope In: 8:00:30 AM Scope Out: 8:24:19 AM Scope Withdrawal Time: 0 hours 18 minutes 57 seconds  Total Procedure Duration: 0 hours 23 minutes 49 seconds  Findings:                 A 7 mm polyp was found in the transverse colon. The                            polyp was sessile. The polyp was removed with a                            cold snare. Resection and retrieval were complete.                           Multiple small and large-mouthed diverticula were                            found in the left colon.                           The exam was otherwise without abnormality on                            direct and retroflexion views. Complications:  No immediate complications. Estimated blood loss:                            None. Estimated Blood Loss:     Estimated blood loss: none. Impression:               - One 7 mm polyp in the transverse colon, removed                            with a cold snare. Resected and retrieved.                           - Diverticulosis in the left colon.                           - The examination was otherwise normal on direct                            and retroflexion views. Recommendation:           - Patient has a contact number available for                            emergencies. The signs and symptoms of potential                            delayed complications were discussed with the                            patient. Return to normal activities tomorrow.                            Written discharge instructions were provided to the                             patient.                           - Resume previous diet.                           - Continue present medications.                           - Await pathology results. Milus Banister, MD 01/10/2022 8:26:22 AM This report has been signed electronically.

## 2022-01-12 ENCOUNTER — Telehealth: Payer: Self-pay

## 2022-01-12 NOTE — Telephone Encounter (Signed)
°  Follow up Call-  Call back number 01/10/2022  Post procedure Call Back phone  # [phone]  Permission to leave phone message Yes  Some recent data might be hidden     Patient questions:  Do you have a fever, pain , or abdominal swelling? No. Pain Score  0 *  Have you tolerated food without any problems? Yes.    Have you been able to return to your normal activities? Yes.    Do you have any questions about your discharge instructions: Diet   No. Medications  No. Follow up visit  No.  Do you have questions or concerns about your Care? No.  Actions: * If pain score is 4 or above: No action needed, pain <4.  Have you developed a fever since your procedure? no  2.   Have you had an respiratory symptoms (SOB or cough) since your procedure? no  3.   Have you tested positive for COVID 19 since your procedure no  4.   Have you had any family members/close contacts diagnosed with the COVID 19 since your procedure?  no   If yes to any of these questions please route to Joylene John, RN and Joella Prince, RN

## 2022-01-13 ENCOUNTER — Other Ambulatory Visit: Payer: Self-pay

## 2022-01-13 ENCOUNTER — Ambulatory Visit (INDEPENDENT_AMBULATORY_CARE_PROVIDER_SITE_OTHER): Payer: BC Managed Care – PPO | Admitting: Family Medicine

## 2022-01-13 ENCOUNTER — Encounter (INDEPENDENT_AMBULATORY_CARE_PROVIDER_SITE_OTHER): Payer: Self-pay | Admitting: Family Medicine

## 2022-01-13 ENCOUNTER — Encounter: Payer: Self-pay | Admitting: Gastroenterology

## 2022-01-13 VITALS — BP 135/85 | HR 73 | Temp 97.9°F | Ht 66.0 in | Wt 193.0 lb

## 2022-01-13 DIAGNOSIS — R7301 Impaired fasting glucose: Secondary | ICD-10-CM

## 2022-01-13 DIAGNOSIS — Z6831 Body mass index (BMI) 31.0-31.9, adult: Secondary | ICD-10-CM

## 2022-01-13 DIAGNOSIS — G4733 Obstructive sleep apnea (adult) (pediatric): Secondary | ICD-10-CM | POA: Diagnosis not present

## 2022-01-13 DIAGNOSIS — Z6836 Body mass index (BMI) 36.0-36.9, adult: Secondary | ICD-10-CM

## 2022-01-13 DIAGNOSIS — E669 Obesity, unspecified: Secondary | ICD-10-CM | POA: Diagnosis not present

## 2022-01-13 DIAGNOSIS — R7989 Other specified abnormal findings of blood chemistry: Secondary | ICD-10-CM

## 2022-01-13 DIAGNOSIS — Z9989 Dependence on other enabling machines and devices: Secondary | ICD-10-CM

## 2022-01-13 MED ORDER — TIRZEPATIDE 15 MG/0.5ML ~~LOC~~ SOAJ: 15.0000 mg | SUBCUTANEOUS | 3 refills | Status: DC

## 2022-01-13 MED ORDER — TIRZEPATIDE 12.5 MG/0.5ML ~~LOC~~ SOAJ: 12.5000 mg | SUBCUTANEOUS | 3 refills | Status: DC

## 2022-01-13 NOTE — Progress Notes (Signed)
Chief Complaint:   OBESITY Lucas Young is here to discuss his progress with his obesity treatment plan along with follow-up of his obesity related diagnoses. See Medical Weight Management Flowsheet for complete bioelectrical impedance results.  Today's visit was #: 15 Starting weight: 226 lbs Starting date: 12/24/2020 Weight change since last visit: +1 lb Total lbs lost to date: 33 lbs Total weight loss percentage to date: -14.60%  Nutrition Plan: Keeping a food journal and adhering to recommended goals of 1500-1800 calories and 125 grams of protein daily for 80% of the time. Activity: Walking/strength/yoga/cardio for 60-120 minutes 6-7 times per week. Anti-obesity medications: Mounjaro 12.5 mg subcutaneously weekly. Reported side effects: None.  Interim History: Lucas Young says that home BPs are 120s/80s. He says his colonoscopy went well.  He has an upcoming endoscopy for Inspire.  Assessment/Plan:   1. 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 Rx Cabergoline. Followed by Dr. Loanne Drilling, who will be leaving [place] by May.  Lucas Young's next appointment with Endocrinology is in April.  Will continue to monitor as it relates to his weight loss journey.  2. Impaired fasting glucose, with polyphagia Controlled. Current treatment: Mounjaro 12.5 mg subcutaneously weekly.    Plan:  Continue Mounjaro 12.5 mg subcutaneously weekly. He will continue to focus on protein-rich, low simple carbohydrate foods. We reviewed the importance of hydration, regular exercise for stress reduction, and restorative sleep.  - Refill tirzepatide (MOUNJARO) 12.5 MG/0.5ML Pen; Inject 12.5 mg into the skin once a week.  Dispense: 6 mL; Refill: 3 - tirzepatide (MOUNJARO) 15 MG/0.5ML Pen; Inject 15 mg into the skin once a week.  Dispense: 6 mL; Refill: 3  3. OSA on CPAP He is working toward obtaining Inspire.   Goal: Treatment of OSA via CPAP compliance and weight loss. Plasma ghrelin levels  (appetite or "hunger hormone") are significantly higher in OSA patients than in BMI-matched controls, but decrease to levels similar to those of obese patients without OSA after CPAP treatment.  Weight loss improves OSA by several mechanisms, including reduction in fatty tissue in the throat (i.e. parapharyngeal fat) and the tongue. Loss of abdominal fat increases mediastinal traction on the upper airway making it less likely to collapse during sleep. Studies have also shown that compliance with CPAP treatment improves leptin (hunger inhibitory hormone) imbalance.  4. Obesity BMI today is 31.2  Course: Lucas Young is currently in the action stage of change. As such, his goal is to continue with weight loss efforts.   Nutrition goals: He has agreed to keeping a food journal and adhering to recommended goals of 1500-1800 calories and 125 grams of protein.   Exercise goals:  As is.  Behavioral modification strategies: increasing lean protein intake, decreasing simple carbohydrates, and increasing vegetables.  Lucas Young has agreed to follow-up with our clinic in 4 weeks. He was informed of the importance of frequent follow-up visits to maximize his success with intensive lifestyle modifications for his multiple health conditions.   Objective:   Blood pressure 135/85, pulse 73, temperature 97.9 F (36.6 C), temperature source Oral, height 5\' 6"  (1.676 m), weight 193 lb (87.5 kg), SpO2 95 %. Body mass index is 31.15 kg/m.  General: Cooperative, alert, well developed, in no acute distress. HEENT: Conjunctivae and lids unremarkable. Cardiovascular: Regular rhythm.  Lungs: Normal work of breathing. Neurologic: No focal deficits.   Lab Results  Component Value Date   CREATININE 0.77 10/28/2021   BUN 17 10/28/2021   NA 137 10/28/2021   K 4.1 10/28/2021  CL 102 10/28/2021   CO2 22 10/28/2021   Lab Results  Component Value Date   ALT 22 10/28/2021   AST 26 10/28/2021   ALKPHOS 74  10/28/2021   BILITOT 0.4 10/28/2021   Lab Results  Component Value Date   HGBA1C 5.6 10/28/2021   HGBA1C 5.8 06/22/2021   HGBA1C 5.8 (H) 03/24/2021   HGBA1C 6.1 (H) 12/24/2020   Lab Results  Component Value Date   INSULIN 9.7 10/28/2021   INSULIN 14.9 03/24/2021   INSULIN 14.6 12/24/2020   Lab Results  Component Value Date   TSH 3.790 10/28/2021   Lab Results  Component Value Date   CHOL 151 10/28/2021   HDL 32 (L) 10/28/2021   LDLCALC 101 (H) 10/28/2021   TRIG 95 10/28/2021   CHOLHDL 4.7 10/28/2021   Lab Results  Component Value Date   VD25OH 60.6 10/28/2021   VD25OH 75.6 03/24/2021   VD25OH 20.7 (L) 12/24/2020   Lab Results  Component Value Date   WBC 7.4 10/28/2021   HGB 14.8 10/28/2021   HCT 44.4 10/28/2021   MCV 85 10/28/2021   PLT 291 10/28/2021   Lab Results  Component Value Date   IRON 134 12/24/2020   TIBC 306 12/24/2020   FERRITIN 140 12/24/2020   Attestation Statements:   Reviewed by clinician on day of visit: allergies, medications, problem list, medical history, surgical history, family history, social history, and previous encounter notes.  I, Water quality scientist, CMA, am acting as transcriptionist for Briscoe Deutscher, DO  I have reviewed the above documentation for accuracy and completeness, and I agree with the above. -  Briscoe Deutscher, DO, MS, FAAFP, DABOM - Family and Bariatric Medicine.

## 2022-01-17 ENCOUNTER — Other Ambulatory Visit: Payer: Self-pay | Admitting: Otolaryngology

## 2022-01-19 ENCOUNTER — Ambulatory Visit (HOSPITAL_BASED_OUTPATIENT_CLINIC_OR_DEPARTMENT_OTHER): Admit: 2022-01-19 | Payer: BC Managed Care – PPO | Admitting: Otolaryngology

## 2022-01-19 ENCOUNTER — Encounter (HOSPITAL_BASED_OUTPATIENT_CLINIC_OR_DEPARTMENT_OTHER): Payer: Self-pay

## 2022-01-19 SURGERY — DRUG INDUCED SLEEP ENDOSCOPY
Anesthesia: General

## 2022-01-24 ENCOUNTER — Encounter (HOSPITAL_BASED_OUTPATIENT_CLINIC_OR_DEPARTMENT_OTHER): Payer: Self-pay | Admitting: Otolaryngology

## 2022-01-24 ENCOUNTER — Other Ambulatory Visit: Payer: Self-pay

## 2022-01-25 ENCOUNTER — Encounter (HOSPITAL_BASED_OUTPATIENT_CLINIC_OR_DEPARTMENT_OTHER)
Admission: RE | Admit: 2022-01-25 | Discharge: 2022-01-25 | Disposition: A | Discharge status: Home / Self Care | Payer: BC Managed Care – PPO | Source: Ambulatory Visit | Attending: Otolaryngology | Admitting: Otolaryngology

## 2022-01-25 DIAGNOSIS — Z01818 Encounter for other preprocedural examination: Secondary | ICD-10-CM | POA: Insufficient documentation

## 2022-01-25 DIAGNOSIS — R739 Hyperglycemia, unspecified: Secondary | ICD-10-CM | POA: Diagnosis not present

## 2022-01-25 LAB — BASIC METABOLIC PANEL
Anion gap: 8 (ref 5–15)
BUN: 15 mg/dL (ref 8–23)
CO2: 26 mmol/L (ref 22–32)
Calcium: 9.5 mg/dL (ref 8.9–10.3)
Chloride: 105 mmol/L (ref 98–111)
Creatinine, Ser: 0.88 mg/dL (ref 0.61–1.24)
GFR, Estimated: >60 mL/min (ref >60)
Glucose, Bld: 98 mg/dL (ref 70–99)
Potassium: 4 mmol/L (ref 3.5–5.1)
Sodium: 139 mmol/L (ref 135–145)

## 2022-02-01 ENCOUNTER — Encounter (HOSPITAL_BASED_OUTPATIENT_CLINIC_OR_DEPARTMENT_OTHER): Payer: Self-pay | Admitting: Otolaryngology

## 2022-02-01 ENCOUNTER — Ambulatory Visit (HOSPITAL_BASED_OUTPATIENT_CLINIC_OR_DEPARTMENT_OTHER): Payer: BC Managed Care – PPO | Admitting: Anesthesiology

## 2022-02-01 ENCOUNTER — Other Ambulatory Visit: Payer: Self-pay

## 2022-02-01 ENCOUNTER — Ambulatory Visit (HOSPITAL_BASED_OUTPATIENT_CLINIC_OR_DEPARTMENT_OTHER)
Admission: RE | Admit: 2022-02-01 | Discharge: 2022-02-01 | Disposition: A | Discharge status: Home / Self Care | Payer: BC Managed Care – PPO | Attending: Otolaryngology | Admitting: Otolaryngology

## 2022-02-01 ENCOUNTER — Encounter (HOSPITAL_BASED_OUTPATIENT_CLINIC_OR_DEPARTMENT_OTHER)
Admission: RE | Disposition: A | Discharge status: Home / Self Care | Payer: Self-pay | Source: Home / Self Care | Attending: Otolaryngology

## 2022-02-01 DIAGNOSIS — I10 Essential (primary) hypertension: Secondary | ICD-10-CM | POA: Insufficient documentation

## 2022-02-01 DIAGNOSIS — F32A Depression, unspecified: Secondary | ICD-10-CM | POA: Insufficient documentation

## 2022-02-01 DIAGNOSIS — M1990 Unspecified osteoarthritis, unspecified site: Secondary | ICD-10-CM | POA: Diagnosis not present

## 2022-02-01 DIAGNOSIS — E119 Type 2 diabetes mellitus without complications: Secondary | ICD-10-CM | POA: Diagnosis not present

## 2022-02-01 DIAGNOSIS — G4733 Obstructive sleep apnea (adult) (pediatric): Secondary | ICD-10-CM | POA: Diagnosis present

## 2022-02-01 DIAGNOSIS — F419 Anxiety disorder, unspecified: Secondary | ICD-10-CM | POA: Diagnosis not present

## 2022-02-01 DIAGNOSIS — R739 Hyperglycemia, unspecified: Secondary | ICD-10-CM

## 2022-02-01 HISTORY — DX: Type 2 diabetes mellitus without complications: E11.9

## 2022-02-01 HISTORY — PX: DRUG INDUCED ENDOSCOPY: SHX6808

## 2022-02-01 SURGERY — DRUG INDUCED SLEEP ENDOSCOPY
Anesthesia: Monitor Anesthesia Care | Site: Nose

## 2022-02-01 MED ORDER — ONDANSETRON HCL 4 MG/2ML IJ SOLN
INTRAMUSCULAR | Status: DC | PRN
  Administered 2022-02-01: 4 mg via INTRAVENOUS

## 2022-02-01 MED ORDER — PROPOFOL 500 MG/50ML IV EMUL
INTRAVENOUS | Status: DC | PRN
  Administered 2022-02-01: 35 ug/kg/min via INTRAVENOUS

## 2022-02-01 MED ORDER — LIDOCAINE 2% (20 MG/ML) 5 ML SYRINGE
INTRAMUSCULAR | Status: DC | PRN
Start: 2022-02-01 — End: 2022-02-01
  Administered 2022-02-01: 60 mg via INTRAVENOUS

## 2022-02-01 MED ORDER — OXYMETAZOLINE HCL 0.05 % NA SOLN
NASAL | Status: DC | PRN
  Administered 2022-02-01: 1 via TOPICAL

## 2022-02-01 MED ORDER — OXYMETAZOLINE HCL 0.05 % NA SOLN
NASAL | Status: AC
Start: 2022-02-01 — End: ?
  Filled 2022-02-01: qty 120

## 2022-02-01 MED ORDER — LACTATED RINGERS IV SOLN: INTRAVENOUS | Status: DC

## 2022-02-01 SURGICAL SUPPLY — 15 items
CANISTER SUCT 1200ML W/VALVE (MISCELLANEOUS) ×3 IMPLANT
GLOVE SURG ENC MOIS LTX SZ7.5 (GLOVE) ×3 IMPLANT
GLOVE SURG UNDER POLY LF SZ6.5 (GLOVE) ×2 IMPLANT
KIT CLEAN ENDO (MISCELLANEOUS) ×3 IMPLANT
NDL HYPO 27GX1-1/4 (NEEDLE) IMPLANT
NEEDLE HYPO 27GX1-1/4 (NEEDLE) IMPLANT
PACK BASIN DAY SURGERY FS (CUSTOM PROCEDURE TRAY) ×1 IMPLANT
PATTIES SURGICAL .5 X3 (DISPOSABLE) ×3 IMPLANT
SHEET MEDIUM DRAPE 40X70 STRL (DRAPES) IMPLANT
SOL ANTI FOG 6CC (MISCELLANEOUS) ×1 IMPLANT
SOLUTION ANTI FOG 6CC (MISCELLANEOUS) ×2
SYR CONTROL 10ML LL (SYRINGE) IMPLANT
TOWEL GREEN STERILE FF (TOWEL DISPOSABLE) ×3 IMPLANT
TUBE CONNECTING 20'X1/4 (TUBING) ×1
TUBE CONNECTING 20X1/4 (TUBING) ×2 IMPLANT

## 2022-02-01 NOTE — H&P (Signed)
Lucas Young is an 66 y.o. male.   ?Chief Complaint: Sleep apnea ?HPI: 66 year old male with obstructive sleep apnea who has not been able to tolerate CPAP. ? ?Past Medical History:  ?Diagnosis Date  ? Allergy   ? Anemia   ? Anxiety   ? Arthritis   ? Back pain   ? Depression   ? Diabetes mellitus without complication ([place])   ? Food allergy   ? Hyperlipidemia   ? Hypertension   ? Lactose intolerance   ? Morton's neuroma   ? Obesity   ? Sleep apnea   ? uses CPAP  ? ? ?Past Surgical History:  ?Procedure Laterality Date  ? APPENDECTOMY    ? COLONOSCOPY  2010  ? Dr.Medoff  normal per pt  ? UMBILICAL HERNIA REPAIR    ? ? ?Family History  ?Problem Relation Age of Onset  ? Hypertension Mother   ? Sleep apnea Mother   ? Obesity Mother   ? Colon polyps Father   ? Hypertension Father   ? Hyperlipidemia Father   ? Heart disease Father   ? Sleep apnea Father   ? Obesity Father   ? Hypertension Maternal Grandmother   ? Hypertension Maternal Grandfather   ? Other Neg Hx   ?     low testosterone  ? Colon cancer Neg Hx   ? Esophageal cancer Neg Hx   ? Stomach cancer Neg Hx   ? Rectal cancer Neg Hx   ? ?Social History:  reports that he has never smoked. He has never used smokeless tobacco. He reports that he does not drink alcohol and does not use drugs. ? ?Allergies:  ?Allergies  ?Allergen Reactions  ? Grass Pollen(K-O-R-T-Swt Vern) Other (See Comments)  ? Molds & Smuts Other (See Comments)  ? Pollen Extract   ? ? ?Medications Prior to Admission  ?Medication Sig Dispense Refill  ? Azelastine HCl 137 MCG/SPRAY SOLN 1-2 puffs in each nostril    ? cabergoline (DOSTINEX) 0.5 MG tablet Take 2 tablets (1 mg total) by mouth 2 (two) times a week. 50 tablet 3  ? desloratadine (CLARINEX) 5 MG tablet Take 5 mg by mouth daily.    ? fluticasone (FLONASE) 50 MCG/ACT nasal spray Place into both nostrils.    ? montelukast (SINGULAIR) 10 MG tablet Take 10 mg by mouth at bedtime.    ? Multiple Vitamin (MULTIVITAMIN) capsule Take 1  capsule by mouth daily.    ? tirzepatide (MOUNJARO) 12.5 MG/0.5ML Pen Inject 12.5 mg into the skin once a week. 6 mL 3  ? tirzepatide (MOUNJARO) 15 MG/0.5ML Pen Inject 15 mg into the skin once a week. 6 mL 3  ? traZODone (DESYREL) 50 MG tablet Take 0.5-1 tablets (25-50 mg total) by mouth at bedtime as needed for sleep. 30 tablet 3  ? ? ?No results found for this or any previous visit (from the past 48 hour(s)). ?No results found. ? ?Review of Systems  ?All other systems reviewed and are negative. ? ?Blood pressure (!) 140/92, pulse 63, temperature 98.2 ?F (36.8 ?C), temperature source Oral, resp. rate 17, height '5\' 6"'$  (1.676 m), weight 89.7 kg, SpO2 97 %. ?Physical Exam ?Constitutional:   ?   Appearance: Normal appearance. He is normal weight.  ?HENT:  ?   Head: Normocephalic and atraumatic.  ?   Right Ear: External ear normal.  ?   Left Ear: External ear normal.  ?   Nose: Nose normal.  ?   Mouth/Throat:  ?  Mouth: Mucous membranes are moist.  ?   Pharynx: Oropharynx is clear.  ?Eyes:  ?   Extraocular Movements: Extraocular movements intact.  ?   Conjunctiva/sclera: Conjunctivae normal.  ?   Pupils: Pupils are equal, round, and reactive to light.  ?Cardiovascular:  ?   Rate and Rhythm: Normal rate.  ?Pulmonary:  ?   Effort: Pulmonary effort is normal.  ?Musculoskeletal:  ?   Cervical back: Normal range of motion.  ?Skin: ?   General: Skin is warm and dry.  ?Neurological:  ?   General: No focal deficit present.  ?   Mental Status: He is alert and oriented to person, place, and time.  ?Psychiatric:     ?   Mood and Affect: Mood normal.     ?   Behavior: Behavior normal.     ?   Thought Content: Thought content normal.     ?   Judgment: Judgment normal.  ?  ? ?Assessment/Plan ?Obstructive sleep apnea and BMI 31.92. ? ?To OR for sleep endoscopy. ? ?Melida Quitter, MD ?02/01/2022, 1:12 PM ? ? ? ?

## 2022-02-01 NOTE — Brief Op Note (Signed)
02/01/2022 ? ?1:32 PM ? ?PATIENT:  Lucas Young  67 y.o. male ? ?PRE-OPERATIVE DIAGNOSIS:  obstructive sleep apnea ? ?POST-OPERATIVE DIAGNOSIS:  obstructive sleep apnea ? ?PROCEDURE:  Procedure(s): ?DRUG INDUCED SPLEEP ENDOSCOPY (N/A) ? ?SURGEON:  Surgeon(s) and Role: ?   Melida Quitter, MD - Primary ? ?PHYSICIAN ASSISTANT:  ? ?ASSISTANTS: none  ? ?ANESTHESIA:   IV sedation ? ?EBL:  None  ? ?BLOOD ADMINISTERED:none ? ?DRAINS: none  ? ?LOCAL MEDICATIONS USED:  NONE ? ?SPECIMEN:  No Specimen ? ?DISPOSITION OF SPECIMEN:  N/A ? ?COUNTS:  YES ? ?TOURNIQUET:  * No tourniquets in log * ? ?DICTATION: .Note written in EPIC ? ?PLAN OF CARE: Discharge to home after PACU ? ?PATIENT DISPOSITION:  PACU - hemodynamically stable. ?  ?Delay start of Pharmacological VTE agent (>24hrs) due to surgical blood loss or risk of bleeding: no ? ?

## 2022-02-01 NOTE — Op Note (Signed)
Preop diagnosis: Obstructive sleep apnea Postop diagnosis: same Procedure: Drug-induced sleep endoscopy Surgeon: Nichole Keltner Anesth: IV sedation Compl: None Findings: There is 50% anterior-posterior and 50% lateral wall collapse at the velum making him a candidate for hypoglossal nerve stimulator placement.  There was also some anterior-posterior collapse at the tongue base. Description:  After discussing risks, benefits, and alternatives, the patient was brought to the operative suite and placed on the operative table in the supine position.  Anesthesia was induced and the patient was given light sedation to simulate natural sleep. When the proper level was reached, an Afrin-soaked pledget was placed in the right nasal passage for a couple of minutes and then removed.  The fiberoptic laryngoscope was then passed to view the pharynx and larynx.  Findings are noted above and the exam was recorded.  After completion, the scope was removed and the patient was returned to anesthesia for wakeup and was moved to the recovery room in stable condition.  

## 2022-02-01 NOTE — Anesthesia Postprocedure Evaluation (Signed)
Anesthesia Post Note ? ?Patient: Lucas Young ? ?Procedure(s) Performed: DRUG INDUCED SPLEEP ENDOSCOPY (Nose) ? ?  ? ?Patient location during evaluation: PACU ?Anesthesia Type: MAC ?Level of consciousness: awake and alert ?Pain management: pain level controlled ?Vital Signs Assessment: post-procedure vital signs reviewed and stable ?Respiratory status: spontaneous breathing, nonlabored ventilation and respiratory function stable ?Cardiovascular status: stable and blood pressure returned to baseline ?Anesthetic complications: no ? ? ?No notable events documented. ? ?Last Vitals:  ?Vitals:  ? 02/01/22 1345 02/01/22 1356  ?BP: 136/88   ?Pulse: 62 64  ?Resp:  20  ?Temp:    ?SpO2: 93% 94%  ?  ?Last Pain:  ?Vitals:  ? 02/01/22 1357  ?TempSrc:   ?PainSc: 0-No pain  ? ? ?  ?  ?  ?  ?  ?  ? ?Audry Pili ? ? ? ? ?

## 2022-02-01 NOTE — Anesthesia Preprocedure Evaluation (Addendum)
Anesthesia Evaluation  ?Patient identified by MRN, date of birth, ID band ?Patient awake ? ? ? ?Reviewed: ?Allergy & Precautions, NPO status , Patient's Chart, lab work & pertinent test results ? ?History of Anesthesia Complications ?Negative for: history of anesthetic complications ? ?Airway ?Mallampati: II ? ?TM Distance: >3 FB ?Neck ROM: Full ? ? ? Dental ? ?(+) Dental Advisory Given, Teeth Intact ?  ?Pulmonary ?sleep apnea and Continuous Positive Airway Pressure Ventilation ,  ?  ?Pulmonary exam normal ? ? ? ? ? ? ? Cardiovascular ?hypertension, Normal cardiovascular exam ? ? ?  ?Neuro/Psych ?PSYCHIATRIC DISORDERS Anxiety Depression negative neurological ROS ?   ? GI/Hepatic ?negative GI ROS, Neg liver ROS,   ?Endo/Other  ?diabetes, Type 2 ? Renal/GU ?negative Renal ROS  ? ?  ?Musculoskeletal ? ?(+) Arthritis ,  ? Abdominal ?  ?Peds ? Hematology ?negative hematology ROS ?(+)   ?Anesthesia Other Findings ? ? Reproductive/Obstetrics ? ?  ? ? ? ? ? ? ? ? ? ? ? ? ? ?  ?  ? ? ? ? ? ? ? ?Anesthesia Physical ?Anesthesia Plan ? ?ASA: 2 ? ?Anesthesia Plan: MAC  ? ?Post-op Pain Management: Minimal or no pain anticipated  ? ?Induction:  ? ?PONV Risk Score and Plan: 1 and Treatment may vary due to age or medical condition and Propofol infusion ? ?Airway Management Planned: Natural Airway and Nasal Cannula ? ?Additional Equipment: None ? ?Intra-op Plan:  ? ?Post-operative Plan:  ? ?Informed Consent: I have reviewed the patients History and Physical, chart, labs and discussed the procedure including the risks, benefits and alternatives for the proposed anesthesia with the patient or authorized representative who has indicated his/her understanding and acceptance.  ? ? ? ? ? ?Plan Discussed with: CRNA and Anesthesiologist ? ?Anesthesia Plan Comments:   ? ? ? ? ? ?Anesthesia Quick Evaluation ? ?

## 2022-02-01 NOTE — Anesthesia Procedure Notes (Signed)
Procedure Name: Simpson ?Date/Time: 02/01/2022 1:33 PM ?Performed by: Yaniel Limbaugh, Ernesta Amble, CRNA ?Pre-anesthesia Checklist: Patient identified, Emergency Drugs available, Suction available, Patient being monitored and Timeout performed ?Patient Re-evaluated:Patient Re-evaluated prior to induction ?Oxygen Delivery Method: Simple face mask ? ? ? ? ?

## 2022-02-01 NOTE — Discharge Instructions (Signed)

## 2022-02-01 NOTE — Transfer of Care (Signed)
Immediate Anesthesia Transfer of Care Note ? ?Patient: Lucas Young ? ?Procedure(s) Performed: DRUG INDUCED SPLEEP ENDOSCOPY (Nose) ? ?Patient Location: PACU ? ?Anesthesia Type:MAC ? ?Level of Consciousness: awake, alert , oriented and patient cooperative ? ?Airway & Oxygen Therapy: Patient Spontanous Breathing ? ?Post-op Assessment: Report given to RN and Post -op Vital signs reviewed and stable ? ?Post vital signs: Reviewed and stable ? ?Last Vitals:  ?Vitals Value Taken Time  ?BP    ?Temp    ?Pulse    ?Resp    ?SpO2    ? ? ?Last Pain:  ?Vitals:  ? 02/01/22 1225  ?TempSrc: Oral  ?PainSc: 0-No pain  ?   ? ?Patients Stated Pain Goal: 8 (02/01/22 1225) ? ?Complications: No notable events documented. ?

## 2022-02-02 ENCOUNTER — Encounter (HOSPITAL_BASED_OUTPATIENT_CLINIC_OR_DEPARTMENT_OTHER): Payer: Self-pay | Admitting: Otolaryngology

## 2022-02-04 NOTE — Progress Notes (Unsigned)
[name] Phone: [phone] Subjective:    I'm seeing this patient by the request  of:  Vivi Barrack, MD  CC:   [id number]  Lucas Young is a 66 y.o. male coming in with complaint of back and neck pain. OMT 12/23/2021. Patient states   Medications patient has been prescribed: None  Taking:         Reviewed prior external information including notes and imaging from previsou exam, outside providers and external EMR if available.   As well as notes that were available from care everywhere and other healthcare systems.  Past medical history, social, surgical and family history all reviewed in electronic medical record.  No pertanent information unless stated regarding to the chief complaint.   Past Medical History:  Diagnosis Date   Allergy    Anemia    Anxiety    Arthritis    Back pain    Depression    Diabetes mellitus without complication ([place])    Food allergy    Hyperlipidemia    Hypertension    Lactose intolerance    Morton's neuroma    Obesity    Sleep apnea    uses CPAP    Allergies  Allergen Reactions   Grass Pollen(K-O-R-T-Swt Vern) Other (See Comments)   Molds & Smuts Other (See Comments)   Pollen Extract      Review of Systems:  No headache, visual changes, nausea, vomiting, diarrhea, constipation, dizziness, abdominal pain, skin rash, fevers, chills, night sweats, weight loss, swollen lymph nodes, body aches, joint swelling, chest pain, shortness of breath, mood changes. POSITIVE muscle aches  Objective  There were no vitals taken for this visit.   General: No apparent distress alert and oriented x3 mood and affect normal, dressed appropriately.  HEENT: Pupils equal, extraocular movements intact  Respiratory: Patient's speak in full sentences and does not appear short of breath  Cardiovascular: No lower extremity edema, non tender, no erythema  Neuro:  Cranial nerves II through XII are intact, neurovascularly intact in all extremities with 2+ DTRs and 2+ pulses.  Gait normal with good balance and coordination.  MSK:  Non tender with full range of motion and good stability and symmetric strength and tone of shoulders, elbows, wrist, hip, knee and ankles bilaterally.  Back - Normal skin, Spine with normal alignment and no deformity.  No tenderness to vertebral process palpation.  Paraspinous muscles are not tender and without spasm.   Range of motion is full at neck and lumbar sacral regions  Osteopathic findings  C2 flexed rotated and side bent right C6 flexed rotated and side bent left T3 extended rotated and side bent right inhaled rib T9 extended rotated and side bent left L2 flexed rotated and side bent right Sacrum right on right       Assessment and Plan:    Nonallopathic problems  Decision today to treat with OMT was based on Physical Exam  After verbal consent patient was treated with HVLA, ME, FPR techniques in cervical, rib, thoracic, lumbar, and sacral  areas  Patient tolerated the procedure well with improvement in symptoms  Patient given exercises, stretches and lifestyle modifications  See medications in patient instructions if given  Patient will follow up in 4-8 weeks      The above documentation has been reviewed and is accurate and complete [name]       Note: This dictation was prepared with Sales executive  along with smaller phrase technology. Any transcriptional errors that result from this process are unintentional.

## 2022-02-09 ENCOUNTER — Ambulatory Visit: Payer: BC Managed Care – PPO | Admitting: Family Medicine

## 2022-02-09 ENCOUNTER — Other Ambulatory Visit: Payer: Self-pay

## 2022-02-09 VITALS — BP 128/88 | HR 72 | Ht 66.0 in

## 2022-02-09 DIAGNOSIS — M9904 Segmental and somatic dysfunction of sacral region: Secondary | ICD-10-CM

## 2022-02-09 DIAGNOSIS — M9901 Segmental and somatic dysfunction of cervical region: Secondary | ICD-10-CM

## 2022-02-09 DIAGNOSIS — M5030 Other cervical disc degeneration, unspecified cervical region: Secondary | ICD-10-CM

## 2022-02-09 DIAGNOSIS — M9902 Segmental and somatic dysfunction of thoracic region: Secondary | ICD-10-CM

## 2022-02-09 DIAGNOSIS — M9903 Segmental and somatic dysfunction of lumbar region: Secondary | ICD-10-CM | POA: Diagnosis not present

## 2022-02-09 DIAGNOSIS — M9908 Segmental and somatic dysfunction of rib cage: Secondary | ICD-10-CM

## 2022-02-09 NOTE — Assessment & Plan Note (Signed)
Chronic problem but overall doing relatively well.  Discussed icing regimen and home exercises.  Discussed which activities to do and which ones to avoid.  Patient has made drastic changes in his quality of life.  Encouraged him to continue with weight loss and follow-up with me again in 6 to 8 weeks. ?

## 2022-02-09 NOTE — Patient Instructions (Signed)
Great to see you ?Be proud of what you have done ?Tell Danae Chen I'm your favorite ?See me in 6-8 weeks ?

## 2022-02-10 ENCOUNTER — Encounter (INDEPENDENT_AMBULATORY_CARE_PROVIDER_SITE_OTHER): Payer: Self-pay | Admitting: Family Medicine

## 2022-02-10 ENCOUNTER — Ambulatory Visit (INDEPENDENT_AMBULATORY_CARE_PROVIDER_SITE_OTHER): Payer: BC Managed Care – PPO | Admitting: Family Medicine

## 2022-02-10 VITALS — BP 122/81 | HR 72 | Temp 97.8°F | Ht 66.0 in | Wt 193.0 lb

## 2022-02-10 DIAGNOSIS — Z6831 Body mass index (BMI) 31.0-31.9, adult: Secondary | ICD-10-CM

## 2022-02-10 DIAGNOSIS — R7301 Impaired fasting glucose: Secondary | ICD-10-CM

## 2022-02-10 DIAGNOSIS — G4733 Obstructive sleep apnea (adult) (pediatric): Secondary | ICD-10-CM | POA: Diagnosis not present

## 2022-02-10 DIAGNOSIS — E669 Obesity, unspecified: Secondary | ICD-10-CM

## 2022-02-10 DIAGNOSIS — R7989 Other specified abnormal findings of blood chemistry: Secondary | ICD-10-CM | POA: Diagnosis not present

## 2022-02-21 NOTE — Progress Notes (Signed)
Chief Complaint:   OBESITY Lucas Young is here to discuss his progress with his obesity treatment plan along with follow-up of his obesity related diagnoses. See Medical Weight Management Flowsheet for complete bioelectrical impedance results.  Today's visit was #: 43 Starting weight: 226 lbs Starting date: 12/24/2020 Weight change since last visit: 0 Total lbs lost to date: 33 lbs Total weight loss percentage to date: -14.60%  Nutrition Plan: Keeping a food journal and adhering to recommended goals of 1500-1800 calories and 125 grams of protein daily for 95% of the time. Activity: Walking/strength training/cardio/yoga for 60-2 minuets 6-7 times per wee,. Anti-obesity medications: Mounjaro 12.5 mg subcutaneously weekly. Reported side effects: None.  Interim History: [name] - eligible for Inspire. Mounjaro 12.5 - Denies polyphagia.  Assessment/Plan:   1. Impaired fasting glucose, with polyphagia Not optimized. Current treatment: Mounjaro 12.5 mg subcutaneously weekly.    Plan: Increase Mounaro to 15 mg subcutaneously weekly. He will continue to focus on protein-rich, low simple carbohydrate foods. We reviewed the importance of hydration, regular exercise for stress reduction, and restorative sleep.  2. OSA (obstructive sleep apnea), would like Inspire We will continue to monitor symptoms as they relate to his weight loss journey.  3. 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 Rx Cabergoline. Followed by Dr. Loanne Drilling, who will be leaving  by May.  Jarius's next appointment with Endocrinology is in April.  Will continue to monitor as it relates to his weight loss journey  4. Obesity with current BMI of 31.2  Course: [name] is currently in the action stage of change. As such, his goal is to continue with weight loss efforts.   Nutrition goals: He has agreed to keeping a food journal and adhering to recommended goals of 1500-1800 calories and  125 grams of protein.   Exercise goals:  As is.  Behavioral modification strategies: increasing lean protein intake, decreasing simple carbohydrates, increasing vegetables, and increasing water intake.  [name] has agreed to follow-up with our clinic in 4 weeks. He was informed of the importance of frequent follow-up visits to maximize his success with intensive lifestyle modifications for his multiple health conditions.   Objective:   Blood pressure 122/81, pulse 72, temperature 97.8 F (36.6 C), temperature source Oral, height '5\' 6"'$  (1.676 m), weight 193 lb (87.5 kg), SpO2 97 %. Body mass index is 31.15 kg/m.  General: Cooperative, alert, well developed, in no acute distress. HEENT: Conjunctivae and lids unremarkable. Cardiovascular: Regular rhythm.  Lungs: Normal work of breathing. Neurologic: No focal deficits.   Lab Results  Component Value Date   CREATININE 0.88 01/25/2022   BUN 15 01/25/2022   NA 139 01/25/2022   K 4.0 01/25/2022   CL 105 01/25/2022   CO2 26 01/25/2022   Lab Results  Component Value Date   ALT 22 10/28/2021   AST 26 10/28/2021   ALKPHOS 74 10/28/2021   BILITOT 0.4 10/28/2021   Lab Results  Component Value Date   HGBA1C 5.6 10/28/2021   HGBA1C 5.8 06/22/2021   HGBA1C 5.8 (H) 03/24/2021   HGBA1C 6.1 (H) 12/24/2020   Lab Results  Component Value Date   INSULIN 9.7 10/28/2021   INSULIN 14.9 03/24/2021   INSULIN 14.6 12/24/2020   Lab Results  Component Value Date   TSH 3.790 10/28/2021   Lab Results  Component Value Date   CHOL 151 10/28/2021   HDL 32 (L) 10/28/2021   LDLCALC 101 (H) 10/28/2021   TRIG 95 10/28/2021   CHOLHDL 4.7 10/28/2021  Lab Results  Component Value Date   VD25OH 60.6 10/28/2021   VD25OH 75.6 03/24/2021   VD25OH 20.7 (L) 12/24/2020   Lab Results  Component Value Date   WBC 7.4 10/28/2021   HGB 14.8 10/28/2021   HCT 44.4 10/28/2021   MCV 85 10/28/2021   PLT 291 10/28/2021   Lab Results  Component  Value Date   IRON 134 12/24/2020   TIBC 306 12/24/2020   FERRITIN 140 12/24/2020   Attestation Statements:   Reviewed by clinician on day of visit: allergies, medications, problem list, medical history, surgical history, family history, social history, and previous encounter notes.  I, Water quality scientist, CMA, am acting as transcriptionist for Briscoe Deutscher, DO  I have reviewed the above documentation for accuracy and completeness, and I agree with the above. -  Briscoe Deutscher, DO, MS, FAAFP, DABOM - Family and Bariatric Medicine.

## 2022-02-24 ENCOUNTER — Encounter: Payer: Self-pay | Admitting: Endocrinology

## 2022-02-25 ENCOUNTER — Other Ambulatory Visit: Payer: Self-pay | Admitting: Endocrinology

## 2022-02-25 DIAGNOSIS — R7989 Other specified abnormal findings of blood chemistry: Secondary | ICD-10-CM

## 2022-02-28 ENCOUNTER — Other Ambulatory Visit: Payer: BC Managed Care – PPO

## 2022-02-28 DIAGNOSIS — R7989 Other specified abnormal findings of blood chemistry: Secondary | ICD-10-CM

## 2022-03-01 ENCOUNTER — Encounter: Payer: Self-pay | Admitting: Endocrinology

## 2022-03-01 ENCOUNTER — Ambulatory Visit: Payer: BC Managed Care – PPO | Admitting: Endocrinology

## 2022-03-01 VITALS — BP 160/98 | HR 60 | Ht 66.0 in | Wt 192.0 lb

## 2022-03-01 DIAGNOSIS — R7989 Other specified abnormal findings of blood chemistry: Secondary | ICD-10-CM

## 2022-03-01 LAB — PROLACTIN: Prolactin: 5.1 ng/mL (ref 2.0–18.0)

## 2022-03-01 NOTE — Progress Notes (Signed)
? ?Subjective:  ? ? Patient ID: Lucas Young, male    DOB: 02-19-1956, 66 y.o.   MRN: [id number] ? ?HPI ?Pt returns for f/u of hyperprolacinemia and low testosterone (he has 2 biological children; pit MRI was normal; he was rx'ed cabergoline).  pt states he feels well in general. He takes cabergoline as rx'ed.   ?Past Medical History:  ?Diagnosis Date  ? Allergy   ? Anemia   ? Anxiety   ? Arthritis   ? Back pain   ? Depression   ? Diabetes mellitus without complication ([place])   ? Food allergy   ? Hyperlipidemia   ? Hypertension   ? Lactose intolerance   ? Morton's neuroma   ? Obesity   ? Sleep apnea   ? uses CPAP  ? ? ?Past Surgical History:  ?Procedure Laterality Date  ? APPENDECTOMY    ? COLONOSCOPY  2010  ? Dr.Medoff  normal per pt  ? DRUG INDUCED ENDOSCOPY N/A 02/01/2022  ? Procedure: DRUG INDUCED SPLEEP ENDOSCOPY;  Surgeon: Melida Quitter, MD;  Location: [facility];  Service: ENT;  Laterality: N/A;  ? UMBILICAL HERNIA REPAIR    ? ? ?Social History  ? ?Socioeconomic History  ? Marital status: Married  ?  Spouse name: Not on file  ? Number of children: Not on file  ? Years of education: Not on file  ? Highest education level: Not on file  ?Occupational History  ? Occupation: Professor  ?Tobacco Use  ? Smoking status: Never  ? Smokeless tobacco: Never  ?Vaping Use  ? Vaping Use: Never used  ?Substance and Sexual Activity  ? Alcohol use: No  ?  Alcohol/week: 0.0 standard drinks  ? Drug use: No  ? Sexual activity: Not on file  ?Other Topics Concern  ? Not on file  ?Social History Narrative  ? Not on file  ? ?Social Determinants of Health  ? ?Financial Resource Strain: Not on file  ?Food Insecurity: Not on file  ?Transportation Needs: Not on file  ?Physical Activity: Not on file  ?Stress: Not on file  ?Social Connections: Not on file  ?Intimate Partner Violence: Not on file  ? ? ?Current Outpatient Medications on File Prior to Visit  ?Medication Sig Dispense Refill  ? Azelastine HCl 137  MCG/SPRAY SOLN 1-2 puffs in each nostril    ? cabergoline (DOSTINEX) 0.5 MG tablet Take 2 tablets (1 mg total) by mouth 2 (two) times a week. 50 tablet 3  ? desloratadine (CLARINEX) 5 MG tablet Take 5 mg by mouth daily.    ? fluticasone (FLONASE) 50 MCG/ACT nasal spray Place into both nostrils.    ? montelukast (SINGULAIR) 10 MG tablet Take 10 mg by mouth at bedtime.    ? Multiple Vitamin (MULTIVITAMIN) capsule Take 1 capsule by mouth daily.    ? tirzepatide (MOUNJARO) 12.5 MG/0.5ML Pen Inject 12.5 mg into the skin once a week. 6 mL 3  ? tirzepatide (MOUNJARO) 15 MG/0.5ML Pen Inject 15 mg into the skin once a week. 6 mL 3  ? traZODone (DESYREL) 50 MG tablet Take 0.5-1 tablets (25-50 mg total) by mouth at bedtime as needed for sleep. 30 tablet 3  ? ?No current facility-administered medications on file prior to visit.  ? ? ?Allergies  ?Allergen Reactions  ? Grass Pollen(K-O-R-T-Swt Vern) Other (See Comments)  ? Molds & Smuts Other (See Comments)  ? Pollen Extract   ? ? ?Family History  ?Problem Relation Age of Onset  ?  Hypertension Mother   ? Sleep apnea Mother   ? Obesity Mother   ? Colon polyps Father   ? Hypertension Father   ? Hyperlipidemia Father   ? Heart disease Father   ? Sleep apnea Father   ? Obesity Father   ? Hypertension Maternal Grandmother   ? Hypertension Maternal Grandfather   ? Other Neg Hx   ?     low testosterone  ? Colon cancer Neg Hx   ? Esophageal cancer Neg Hx   ? Stomach cancer Neg Hx   ? Rectal cancer Neg Hx   ? ? ?BP (!) 160/98 (BP Location: Left Arm, Patient Position: Sitting, Cuff Size: Normal)   Pulse 60   Ht '5\' 6"'$  (1.676 m)   Wt 192 lb (87.1 kg)   SpO2 96%   BMI 30.99 kg/m?  ? ? ?Review of Systems ? ?   ?Objective:  ? Physical Exam ?VITAL SIGNS:  See vs page.   ?GENERAL: no distress.  ?EXT: no leg edema.   ? ? ?Prolactin=5 ?   ?Assessment & Plan:  ?Hyperprolactinemia: well-controlled.  Please continue the same cabergoline ?Low testosterone: recheck today ? ?

## 2022-03-01 NOTE — Patient Instructions (Addendum)
Please continue the same cabergoline.   ?Testosterone treatment has risks, including increased or decreased fertility (depending on the type of treatment), hair loss, prostate cancer, benign prostate enlargement, blood clots, liver problems, lower hdl ("good cholesterol"), polycythemia (opposite of anemia), sleep apnea, and behavior changes.   ?When you see Dr Jerline Pain, you should discuss the MRI results.  ?Please come back for a follow-up appointment in 1 year.   ?

## 2022-03-03 LAB — TESTOSTERONE,FREE AND TOTAL
Testosterone, Free: 6.6 pg/mL (ref 6.6–18.1)
Testosterone: 426 ng/dL (ref 264–916)

## 2022-03-07 ENCOUNTER — Encounter (INDEPENDENT_AMBULATORY_CARE_PROVIDER_SITE_OTHER): Payer: Self-pay

## 2022-03-07 ENCOUNTER — Ambulatory Visit (INDEPENDENT_AMBULATORY_CARE_PROVIDER_SITE_OTHER): Payer: BC Managed Care – PPO | Admitting: Family Medicine

## 2022-03-16 ENCOUNTER — Encounter: Payer: BC Managed Care – PPO | Admitting: Family Medicine

## 2022-03-22 NOTE — Progress Notes (Signed)
?Charlann Boxer D.O. ?[facility] ?[place] ?Phone: [phone] ?Subjective:   ?I, [name], am serving as a scribe for Dr. Hulan Saas. ? ?This visit occurred during the SARS-CoV-2 public health emergency.  Safety protocols were in place, including screening questions prior to the visit, additional usage of staff PPE, and extensive cleaning of exam room while observing appropriate contact time as indicated for disinfecting solutions.  ? ? ?I'm seeing this patient by the request  of:  Vivi Barrack, MD ? ?CC: Neck and back pain follow-up ? ?[id number]  ?Lucas Young is a 66 y.o. male coming in with complaint of back and neck pain. OMT 02/09/2022. Patient states that he is stiff but otherwise doing well.  ? ?Medications patient has been prescribed: None ? ?Taking: ? ? ?  ? ? ? ? ?Reviewed prior external information including notes and imaging from previsou exam, outside providers and external EMR if available.  ? ?As well as notes that were available from care everywhere and other healthcare systems. ? ?Past medical history, social, surgical and family history all reviewed in electronic medical record.  No pertanent information unless stated regarding to the chief complaint.  ? ?Past Medical History:  ?Diagnosis Date  ? Allergy   ? Anemia   ? Anxiety   ? Arthritis   ? Back pain   ? Depression   ? Diabetes mellitus without complication ([place])   ? Food allergy   ? Hyperlipidemia   ? Hypertension   ? Lactose intolerance   ? Morton's neuroma   ? Obesity   ? Sleep apnea   ? uses CPAP  ?  ?Allergies  ?Allergen Reactions  ? Grass Pollen(K-O-R-T-Swt Vern) Other (See Comments)  ? Molds & Smuts Other (See Comments)  ? Pollen Extract   ? ? ? ?Review of Systems: ? No headache, visual changes, nausea, vomiting, diarrhea, constipation, dizziness, abdominal pain, skin rash, fevers, chills, night sweats, weight loss, swollen lymph nodes, body aches, joint swelling, chest  pain, shortness of breath, mood changes. POSITIVE muscle aches ? ?Objective  ?Blood pressure (!) 140/102, pulse 77, height '5\' 6"'$  (1.676 m), SpO2 99 %. ?  ?General: No apparent distress alert and oriented x3 mood and affect normal, dressed appropriately.  ?HEENT: Pupils equal, extraocular movements intact  ?Respiratory: Patient's speak in full sentences and does not appear short of breath  ?Cardiovascular: No lower extremity edema, non tender, no erythema  ?Neck exam does have some loss of the lumbar spine as well.  Tightness noted test right greater than left. ? ?Osteopathic findings ? ?C2 flexed rotated and side bent right ?C5 flexed rotated and side bent left ?T3 extended rotated and side bent right inhaled rib ?T9 extended rotated and side bent left ?L1 flexed rotated and side bent right ?Sacrum right on right ? ? ? ? ?  ?Assessment and Plan: ? ?SI (sacroiliac) joint dysfunction ?Sacrum dysfunction.  Patient discussed this.  Patient better with patient encouraged him to continue to.  Follow-up with me again in 2 months  ? ?Nonallopathic problems ? ?Decision today to treat with OMT was based on Physical Exam ? ?After verbal consent patient was treated with HVLA, ME, FPR techniques in cervical, rib, thoracic, lumbar, and sacral  areas ? ?Patient tolerated the procedure well with improvement in symptoms ? ?Patient given exercises, stretches and lifestyle modifications ? ?See medications in patient instructions if given ? ?Patient will follow up in 4-8 weeks ? ?  ? ? ?  The above documentation has been reviewed and is accurate and complete [name], DO ? ? ? ?  ? ? Note: This dictation was prepared with Dragon dictation along with smaller phrase technology. Any transcriptional errors that result from this process are unintentional.    ?  ?  ? ?

## 2022-03-23 ENCOUNTER — Ambulatory Visit: Payer: BC Managed Care – PPO | Admitting: Family Medicine

## 2022-03-23 VITALS — BP 140/102 | HR 77 | Ht 66.0 in

## 2022-03-23 DIAGNOSIS — M9908 Segmental and somatic dysfunction of rib cage: Secondary | ICD-10-CM | POA: Diagnosis not present

## 2022-03-23 DIAGNOSIS — M9903 Segmental and somatic dysfunction of lumbar region: Secondary | ICD-10-CM | POA: Diagnosis not present

## 2022-03-23 DIAGNOSIS — M9902 Segmental and somatic dysfunction of thoracic region: Secondary | ICD-10-CM

## 2022-03-23 DIAGNOSIS — M9904 Segmental and somatic dysfunction of sacral region: Secondary | ICD-10-CM | POA: Diagnosis not present

## 2022-03-23 DIAGNOSIS — M5030 Other cervical disc degeneration, unspecified cervical region: Secondary | ICD-10-CM

## 2022-03-23 DIAGNOSIS — M533 Sacrococcygeal disorders, not elsewhere classified: Secondary | ICD-10-CM | POA: Diagnosis not present

## 2022-03-23 DIAGNOSIS — M9901 Segmental and somatic dysfunction of cervical region: Secondary | ICD-10-CM

## 2022-03-23 NOTE — Assessment & Plan Note (Signed)
Sacrum dysfunction.  Patient discussed this.  Patient better with patient encouraged him to continue to.  Follow-up with me again in 2 months ?

## 2022-03-23 NOTE — Patient Instructions (Signed)
See me in 7 weeks ?Good to see you ?

## 2022-04-04 ENCOUNTER — Ambulatory Visit (INDEPENDENT_AMBULATORY_CARE_PROVIDER_SITE_OTHER): Payer: BC Managed Care – PPO | Admitting: Family Medicine

## 2022-04-04 ENCOUNTER — Encounter: Payer: Self-pay | Admitting: Family Medicine

## 2022-04-04 ENCOUNTER — Other Ambulatory Visit (HOSPITAL_COMMUNITY): Payer: Self-pay

## 2022-04-04 VITALS — BP 156/102 | HR 73 | Temp 97.2°F | Ht 66.0 in | Wt 195.4 lb

## 2022-04-04 DIAGNOSIS — E559 Vitamin D deficiency, unspecified: Secondary | ICD-10-CM | POA: Diagnosis not present

## 2022-04-04 DIAGNOSIS — E669 Obesity, unspecified: Secondary | ICD-10-CM

## 2022-04-04 DIAGNOSIS — G4733 Obstructive sleep apnea (adult) (pediatric): Secondary | ICD-10-CM

## 2022-04-04 DIAGNOSIS — R739 Hyperglycemia, unspecified: Secondary | ICD-10-CM | POA: Diagnosis not present

## 2022-04-04 DIAGNOSIS — F419 Anxiety disorder, unspecified: Secondary | ICD-10-CM

## 2022-04-04 DIAGNOSIS — R7989 Other specified abnormal findings of blood chemistry: Secondary | ICD-10-CM | POA: Diagnosis not present

## 2022-04-04 DIAGNOSIS — Z0001 Encounter for general adult medical examination with abnormal findings: Secondary | ICD-10-CM

## 2022-04-04 DIAGNOSIS — D352 Benign neoplasm of pituitary gland: Secondary | ICD-10-CM

## 2022-04-04 DIAGNOSIS — Z13220 Encounter for screening for lipoid disorders: Secondary | ICD-10-CM

## 2022-04-04 DIAGNOSIS — Z9989 Dependence on other enabling machines and devices: Secondary | ICD-10-CM

## 2022-04-04 DIAGNOSIS — Z23 Encounter for immunization: Secondary | ICD-10-CM

## 2022-04-04 LAB — COMPREHENSIVE METABOLIC PANEL
ALT: 22 U/L (ref 0–53)
AST: 24 U/L (ref 0–37)
Albumin: 4.3 g/dL (ref 3.5–5.2)
Alkaline Phosphatase: 54 U/L (ref 39–117)
BUN: 18 mg/dL (ref 6–23)
CO2: 28 mEq/L (ref 19–32)
Calcium: 9.3 mg/dL (ref 8.4–10.5)
Chloride: 103 mEq/L (ref 96–112)
Creatinine, Ser: 0.91 mg/dL (ref 0.40–1.50)
GFR: 89.01 mL/min (ref >60.00)
Glucose, Bld: 88 mg/dL (ref 70–99)
Potassium: 4.3 mEq/L (ref 3.5–5.1)
Sodium: 138 mEq/L (ref 135–145)
Total Bilirubin: 0.8 mg/dL (ref 0.2–1.2)
Total Protein: 6.7 g/dL (ref 6.0–8.3)

## 2022-04-04 LAB — CBC
HCT: 43.4 % (ref 39.0–52.0)
Hemoglobin: 14.5 g/dL (ref 13.0–17.0)
MCHC: 33.3 g/dL (ref 30.0–36.0)
MCV: 85.2 fl (ref 78.0–100.0)
Platelets: 230 10*3/uL (ref 150.0–400.0)
RBC: 5.1 Mil/uL (ref 4.22–5.81)
RDW: 14.2 % (ref 11.5–15.5)
WBC: 6.5 10*3/uL (ref 4.0–10.5)

## 2022-04-04 LAB — HEMOGLOBIN A1C: Hgb A1c MFr Bld: 5.6 % (ref 4.6–6.5)

## 2022-04-04 LAB — LIPID PANEL
Cholesterol: 152 mg/dL (ref 0–200)
HDL: 36 mg/dL — ABNORMAL LOW (ref >39.00)
LDL Cholesterol: 98 mg/dL (ref 0–99)
NonHDL: 115.87
Total CHOL/HDL Ratio: 4
Triglycerides: 87 mg/dL (ref 0.0–149.0)
VLDL: 17.4 mg/dL (ref 0.0–40.0)

## 2022-04-04 LAB — TESTOSTERONE: Testosterone: 363.43 ng/dL (ref 300.00–890.00)

## 2022-04-04 LAB — VITAMIN D 25 HYDROXY (VIT D DEFICIENCY, FRACTURES): VITD: 42.58 ng/mL (ref 30.00–100.00)

## 2022-04-04 LAB — PSA: PSA: 0.39 ng/mL (ref 0.10–4.00)

## 2022-04-04 LAB — TSH: TSH: 2.36 u[IU]/mL (ref 0.35–5.50)

## 2022-04-04 MED ORDER — MOUNJARO 12.5 MG/0.5ML ~~LOC~~ SOAJ
SUBCUTANEOUS | 9 refills | Status: DC
  Filled 2022-04-04: qty 2, 28d supply, fill #0

## 2022-04-04 NOTE — Assessment & Plan Note (Signed)
He is on cabergoline per endocrinology.  Check testosterone and prolactin today. ?

## 2022-04-04 NOTE — Assessment & Plan Note (Signed)
BMI 31.  He has lost quite a bit of weight recently.  Congratulated patient on weight loss.  We discussed diet and exercise.  He will continue management per weight loss clinic. ?

## 2022-04-04 NOTE — Assessment & Plan Note (Signed)
Check vit D 

## 2022-04-04 NOTE — Patient Instructions (Signed)
?It was very nice to see you today! ? ?We we will give your shingles shot today. ? ?We will check forward. ? ?Please keep an eye on your blood pressure and let me know if it is persistently 140/90 or higher. ? ?We will see back in year for your annual physical.  Please come back sooner if needed. ? ?Take care, ?Dr Jerline Pain ? ?PLEASE NOTE: ? ?If you had any lab tests please let us know if you have not heard back within a few days. You may see your results on mychart before we have a chance to review them but we will give you a call once they are reviewed by [place]. If we ordered any referrals today, please let us know if you have not heard from their office within the next week.  ? ?Please try these tips to maintain a healthy lifestyle: ? ?Eat at least 3 REAL meals and 1-2 snacks per day.  Aim for no more than 5 hours between eating.  If you eat breakfast, please do so within one hour of getting up.  ? ?Each meal should contain half fruits/vegetables, one quarter protein, and one quarter carbs (no bigger than a computer mouse) ? ?Cut down on sweet beverages. This includes juice, soda, and sweet tea.  ? ?Drink at least 1 glass of water with each meal and aim for at least 8 glasses per day ? ?Exercise at least 150 minutes every week.   ? ?Preventive Care 65-66 Years Old, Male ?Preventive care refers to lifestyle choices and visits with your health care provider that can promote health and wellness. Preventive care visits are also called wellness exams. ?What can I expect for my preventive care visit? ?Counseling ?During your preventive care visit, your health care provider may ask about your: ?Medical history, including: ?Past medical problems. ?Family medical history. ?Current health, including: ?Emotional well-being. ?Home life and relationship well-being. ?Sexual activity. ?Lifestyle, including: ?Alcohol, nicotine or tobacco, and drug use. ?Access to firearms. ?Diet, exercise, and sleep habits. ?Safety issues such as  seatbelt and bike helmet use. ?Sunscreen use. ?Work and work Statistician. ?Physical exam ?Your health care provider will check your: ?Height and weight. These may be used to calculate your BMI (body mass index). BMI is a measurement that tells if you are at a healthy weight. ?Waist circumference. This measures the distance around your waistline. This measurement also tells if you are at a healthy weight and may help predict your risk of certain diseases, such as type 2 diabetes and high blood pressure. ?Heart rate and blood pressure. ?Body temperature. ?Skin for abnormal spots. ?What immunizations do I need? ? ?Vaccines are usually given at various ages, according to a schedule. Your health care provider will recommend vaccines for you based on your age, medical history, and lifestyle or other factors, such as travel or where you work. ?What tests do I need? ?Screening ?Your health care provider may recommend screening tests for certain conditions. This may include: ?Lipid and cholesterol levels. ?Diabetes screening. This is done by checking your blood sugar (glucose) after you have not eaten for a while (fasting). ?Hepatitis B test. ?Hepatitis C test. ?HIV (human immunodeficiency virus) test. ?STI (sexually transmitted infection) testing, if you are at risk. ?Lung cancer screening. ?Prostate cancer screening. ?Colorectal cancer screening. ?Talk with your health care provider about your test results, treatment options, and if necessary, the need for more tests. ?Follow these instructions at home: ?Eating and drinking ? ?Eat a diet that includes fresh  fruits and vegetables, whole grains, lean protein, and low-fat dairy products. ?Take vitamin and mineral supplements as recommended by your health care provider. ?Do not drink alcohol if your health care provider tells you not to drink. ?If you drink alcohol: ?Limit how much you have to 0-2 drinks a day. ?Know how much alcohol is in your drink. In the [place] one drink  equals one 12 oz bottle of beer (355 mL), one 5 oz glass of wine (148 mL), or one 1? oz glass of hard liquor (44 mL). ?Lifestyle ?Brush your teeth every morning and night with fluoride toothpaste. Floss one time each day. ?Exercise for at least 30 minutes 5 or more days each week. ?Do not use any products that contain nicotine or tobacco. These products include cigarettes, chewing tobacco, and vaping devices, such as e-cigarettes. If you need help quitting, ask your health care provider. ?Do not use drugs. ?If you are sexually active, practice safe sex. Use a condom or other form of protection to prevent STIs. ?Take aspirin only as told by your health care provider. Make sure that you understand how much to take and what form to take. Work with your health care provider to find out whether it is safe and beneficial for you to take aspirin daily. ?Find healthy ways to manage stress, such as: ?Meditation, yoga, or listening to music. ?Journaling. ?Talking to a trusted person. ?Spending time with friends and family. ?Minimize exposure to UV radiation to reduce your risk of skin cancer. ?Safety ?Always wear your seat belt while driving or riding in a vehicle. ?Do not drive: ?If you have been drinking alcohol. Do not ride with someone who has been drinking. ?When you are tired or distracted. ?While texting. ?If you have been using any mind-altering substances or drugs. ?Wear a helmet and other protective equipment during sports activities. ?If you have firearms in your house, make sure you follow all gun safety procedures. ?What's next? ?Go to your health care provider once a year for an annual wellness visit. ?Ask your health care provider how often you should have your eyes and teeth checked. ?Stay up to date on all vaccines. ?This information is not intended to replace advice given to you by your health care provider. Make sure you discuss any questions you have with your health care provider. ?Document Revised:  05/12/2021 Document Reviewed: 05/12/2021 ?Elsevier Patient Education ? [place]. ? ?

## 2022-04-04 NOTE — Assessment & Plan Note (Signed)
Follows with endocrinology.  We will check prolactin level today.  Had MRI done last year which did not show any obvious lesions.  He will continue cabergoline 1 mg twice weekly. ?

## 2022-04-04 NOTE — Assessment & Plan Note (Signed)
Check A1c.  He is on Mounjaro for weight loss clinic. ?

## 2022-04-04 NOTE — Assessment & Plan Note (Signed)
Continue management per ENT.  He is looking into getting inspire device. ?

## 2022-04-04 NOTE — Progress Notes (Signed)
? ?Chief Complaint:  ?Lucas Young is a 66 y.o. male who presents today for his annual comprehensive physical exam.   ? ?Assessment/Plan:  ?New/Acute Problems: ?Elevated Blood Pressure Reading ?Has whitecoat hypertension.  Typically well controlled at home.  We discussed home monitoring goal 140/90 or higher.  We will let me know if persistently elevated at home. ? ?Chronic Problems Addressed Today: ?Hyperglycemia ?Check A1c.  He is on Mounjaro for weight loss clinic. ? 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  We will check prolactin level today.  Had MRI done last year which did not show any obvious lesions.  He will continue cabergoline 1 mg twice weekly. ? ?Obesity ?BMI 31.  He has lost quite a bit of weight recently.  Congratulated patient on weight loss.  We discussed diet and exercise.  He will continue management per weight loss clinic. ? ?OSA on CPAP ?Continue management per ENT.  He is looking into getting inspire device. ? ?Low testosterone ?He is on cabergoline per endocrinology.  Check testosterone and prolactin today. ? ?Vitamin D deficiency ?Check vit D.  ? ?Preventative Healthcare: ?Check labs. Shingles vaccine given today.  ? ?Patient Counseling(The following topics were reviewed and/or handout was given): ? -Nutrition: Stressed importance of moderation in sodium/caffeine intake, saturated fat and cholesterol, caloric balance, sufficient intake of fresh fruits, vegetables, and fiber. ? -Stressed the importance of regular exercise.  ? -Substance Abuse: Discussed cessation/primary prevention of tobacco, alcohol, or other drug use; driving or other dangerous activities under the influence; availability of treatment for abuse.  ? -Injury prevention: Discussed safety belts, safety helmets, smoke detector, smoking near bedding or upholstery.  ? -Sexuality: Discussed sexually transmitted diseases, partner selection, use of condoms, avoidance of unintended pregnancy and  contraceptive alternatives.  ? -Dental health: Discussed importance of regular tooth brushing, flossing, and dental visits. ? -Health maintenance and immunizations reviewed. Please refer to Health maintenance section. ? ?Return to care in 1 year for next preventative visit.  ? ?  ?Subjective:  ?HPI: ? ?He has no acute complaints today.  ? ?Lifestyle ?Diet: Trying to get plenty of fruits and vegetables. High protein.  ?Exercise: Goes to the gym 3-4 days per week. Walks daily. Does yoga.  ? ? ?  04/04/2022  ?  9:05 AM  ?Depression screen PHQ 2/9  ?Decreased Interest 0  ?Down, Depressed, Hopeless 0  ?PHQ - 2 Score 0  ? ? ?There are no preventive care reminders to display for this patient. ?  ? ?ROS: Per HPI, otherwise a complete review of systems was negative.  ? ?PMH: ? ?The following were reviewed and entered/updated in epic: ?Past Medical History:  ?Diagnosis Date  ? Allergy   ? Anemia   ? Anxiety   ? Arthritis   ? Back pain   ? Depression   ? Diabetes mellitus without complication ([place])   ? Food allergy   ? Hyperlipidemia   ? Hypertension   ? Lactose intolerance   ? Morton's neuroma   ? Obesity   ? Sleep apnea   ? uses CPAP  ? ?Patient Active Problem List  ? Diagnosis Date Noted  ? Degenerative disc disease, cervical 11/02/2021  ? Right lateral epicondylitis 09/07/2021  ? Screening for malignant neoplasm of prostate 06/01/2021  ? Hyperglycemia 06/01/2021  ? 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 OSA on CPAP 03/15/2021  ? Allergic rhinitis 03/15/2021  ? Anxiety 03/15/2021  ? Obesity 03/15/2021  ? Vitamin D deficiency 01/29/2021  ? Low testosterone 01/29/2021  ?  Bilateral shoulder pain 12/23/2020  ? Nonallopathic lesion of rib cage 07/02/2018  ? Nonallopathic lesion of cervical region 07/02/2018  ? SI (sacroiliac) joint dysfunction 12/21/2015  ? Paronychia of third finger of left hand 08/03/2015  ? Lumbar back pain 12/22/2014  ? Morton's neuroma of left foot 12/22/2014  ? Nonallopathic lesion of lumbosacral region  12/22/2014  ? Nonallopathic lesion of sacral region 12/22/2014  ? Nonallopathic lesion of thoracic region 12/22/2014  ? ?Past Surgical History:  ?Procedure Laterality Date  ? APPENDECTOMY    ? COLONOSCOPY  2010  ? Dr.Medoff  normal per pt  ? DRUG INDUCED ENDOSCOPY N/A 02/01/2022  ? Procedure: DRUG INDUCED SPLEEP ENDOSCOPY;  Surgeon: Melida Quitter, MD;  Location: [place];  Service: ENT;  Laterality: N/A;  ? UMBILICAL HERNIA REPAIR    ? ? ?Family History  ?Problem Relation Age of Onset  ? Hypertension Mother   ? Sleep apnea Mother   ? Obesity Mother   ? Colon polyps Father   ? Hypertension Father   ? Hyperlipidemia Father   ? Heart disease Father   ? Sleep apnea Father   ? Obesity Father   ? Hypertension Maternal Grandmother   ? Hypertension Maternal Grandfather   ? Other Neg Hx   ?     low testosterone  ? Colon cancer Neg Hx   ? Esophageal cancer Neg Hx   ? Stomach cancer Neg Hx   ? Rectal cancer Neg Hx   ? ? ?Medications- reviewed and updated ?Current Outpatient Medications  ?Medication Sig Dispense Refill  ? Azelastine HCl 137 MCG/SPRAY SOLN 1-2 puffs in each nostril    ? cabergoline (DOSTINEX) 0.5 MG tablet Take 2 tablets (1 mg total) by mouth 2 (two) times a week. 50 tablet 3  ? desloratadine (CLARINEX) 5 MG tablet Take 5 mg by mouth daily.    ? fluticasone (FLONASE) 50 MCG/ACT nasal spray Place into both nostrils.    ? montelukast (SINGULAIR) 10 MG tablet Take 10 mg by mouth at bedtime.    ? Multiple Vitamin (MULTIVITAMIN) capsule Take 1 capsule by mouth daily.    ? tirzepatide (MOUNJARO) 15 MG/0.5ML Pen Inject 15 mg into the skin once a week. 6 mL 3  ? traZODone (DESYREL) 50 MG tablet Take 0.5-1 tablets (25-50 mg total) by mouth at bedtime as needed for sleep. 30 tablet 3  ? ?No current facility-administered medications for this visit.  ? ? ?Allergies-reviewed and updated ?Allergies  ?Allergen Reactions  ? Grass Pollen(K-O-R-T-Swt Vern) Other (See Comments)  ? Molds & Smuts Other (See Comments)   ? Pollen Extract   ? ? ?Social History  ? ?Socioeconomic History  ? Marital status: Married  ?  Spouse name: Not on file  ? Number of children: Not on file  ? Years of education: Not on file  ? Highest education level: Not on file  ?Occupational History  ? Occupation: Professor  ?Tobacco Use  ? Smoking status: Never  ? Smokeless tobacco: Never  ?Vaping Use  ? Vaping Use: Never used  ?Substance and Sexual Activity  ? Alcohol use: No  ?  Alcohol/week: 0.0 standard drinks  ? Drug use: No  ? Sexual activity: Not on file  ?Other Topics Concern  ? Not on file  ?Social History Narrative  ? Not on file  ? ?Social Determinants of Health  ? ?Financial Resource Strain: Not on file  ?Food Insecurity: Not on file  ?Transportation Needs: Not on file  ?Physical Activity:  Not on file  ?Stress: Not on file  ?Social Connections: Not on file  ? ?   ?  ?Objective:  ?Physical Exam: ?BP (!) 156/102 (BP Location: Left Arm)   Pulse 73   Temp (!) 97.2 ?F (36.2 ?C) (Temporal)   Ht '5\' 6"'$  (1.676 m)   Wt 195 lb 6.4 oz (88.6 kg)   SpO2 96%   BMI 31.54 kg/m?   ?Body mass index is 31.54 kg/m?. ?Wt Readings from Last 3 Encounters:  ?04/04/22 195 lb 6.4 oz (88.6 kg)  ?03/01/22 192 lb (87.1 kg)  ?02/10/22 193 lb (87.5 kg)  ? ?Gen: NAD, resting comfortably ?HEENT: TMs normal bilaterally. OP clear. No thyromegaly noted.  ?CV: RRR with no murmurs appreciated ?Pulm: NWOB, CTAB with no crackles, wheezes, or rhonchi ?GI: Normal bowel sounds present. Soft, Nontender, Nondistended. ?MSK: no edema, cyanosis, or clubbing noted ?Skin: warm, dry ?Neuro: CN2-12 grossly intact. Strength 5/5 in upper and lower extremities. Reflexes symmetric and intact bilaterally.  ?Psych: Normal affect and thought content ?   ? ?Algis Greenhouse. Jerline Pain, MD ?04/04/2022 9:51 AM  ?

## 2022-04-05 LAB — PROLACTIN: Prolactin: 4.7 ng/mL (ref 2.0–18.0)

## 2022-04-06 NOTE — Progress Notes (Signed)
Please inform patient of the following: ? ?His "good" cholesterol is a little low but stable compared to previous values.  Everything else is normal.  Do not need to make any changes to his treatment plan at this point.  He should continue the good work and we can recheck in 6 to 12 months.

## 2022-04-18 ENCOUNTER — Other Ambulatory Visit: Payer: Self-pay | Admitting: Otolaryngology

## 2022-04-20 ENCOUNTER — Ambulatory Visit: Payer: BC Managed Care – PPO | Admitting: Endocrinology

## 2022-04-21 ENCOUNTER — Other Ambulatory Visit: Payer: Self-pay

## 2022-04-21 ENCOUNTER — Encounter (HOSPITAL_COMMUNITY): Payer: Self-pay | Admitting: Otolaryngology
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
## 2022-04-22 NOTE — Progress Notes (Signed)
Dr. Redmond Baseman' office called to clarify patient's NPO instructions for surgery. Patient is allow to have clear liquids up until 3 hours before surgery time. Left patient a voicemail explaining that he could have clear liquids up until 1115 the morning of surgery.

## 2022-04-26 ENCOUNTER — Encounter (HOSPITAL_COMMUNITY)
Admission: RE | Disposition: A | Discharge status: Home / Self Care | Payer: Self-pay | Source: Home / Self Care | Attending: Otolaryngology

## 2022-04-26 ENCOUNTER — Ambulatory Visit (HOSPITAL_COMMUNITY): Payer: BC Managed Care – PPO

## 2022-04-26 ENCOUNTER — Ambulatory Visit (HOSPITAL_COMMUNITY): Payer: BC Managed Care – PPO | Admitting: Anesthesiology

## 2022-04-26 ENCOUNTER — Other Ambulatory Visit: Payer: Self-pay

## 2022-04-26 ENCOUNTER — Ambulatory Visit (HOSPITAL_COMMUNITY)
Admission: RE | Admit: 2022-04-26 | Discharge: 2022-04-26 | Disposition: A | Discharge status: Home / Self Care | Payer: BC Managed Care – PPO | Attending: Otolaryngology | Admitting: Otolaryngology

## 2022-04-26 ENCOUNTER — Encounter (HOSPITAL_COMMUNITY): Payer: Self-pay | Admitting: Otolaryngology

## 2022-04-26 DIAGNOSIS — F419 Anxiety disorder, unspecified: Secondary | ICD-10-CM | POA: Diagnosis not present

## 2022-04-26 DIAGNOSIS — M1990 Unspecified osteoarthritis, unspecified site: Secondary | ICD-10-CM | POA: Diagnosis not present

## 2022-04-26 DIAGNOSIS — Z6830 Body mass index (BMI) 30.0-30.9, adult: Secondary | ICD-10-CM | POA: Diagnosis not present

## 2022-04-26 DIAGNOSIS — F32A Depression, unspecified: Secondary | ICD-10-CM | POA: Diagnosis not present

## 2022-04-26 DIAGNOSIS — E669 Obesity, unspecified: Secondary | ICD-10-CM | POA: Insufficient documentation

## 2022-04-26 DIAGNOSIS — I10 Essential (primary) hypertension: Secondary | ICD-10-CM | POA: Insufficient documentation

## 2022-04-26 DIAGNOSIS — G4733 Obstructive sleep apnea (adult) (pediatric): Secondary | ICD-10-CM | POA: Diagnosis present

## 2022-04-26 HISTORY — PX: IMPLANTATION OF HYPOGLOSSAL NERVE STIMULATOR: SHX6827

## 2022-04-26 HISTORY — DX: Prediabetes: R73.03

## 2022-04-26 HISTORY — DX: Metabolic syndrome: E88.810

## 2022-04-26 HISTORY — DX: Metabolic syndrome: E88.81

## 2022-04-26 HISTORY — DX: Pneumonia, unspecified organism: J18.9

## 2022-04-26 SURGERY — INSERTION, HYPOGLOSSAL NERVE STIMULATOR
Anesthesia: General | Site: Neck | Laterality: Right

## 2022-04-26 MED ORDER — ONDANSETRON HCL 4 MG/2ML IJ SOLN
INTRAMUSCULAR | Status: AC
  Filled 2022-04-26: qty 2

## 2022-04-26 MED ORDER — ACETAMINOPHEN 160 MG/5ML PO SOLN: 1000.0000 mg | Freq: Once | ORAL | Status: DC | PRN

## 2022-04-26 MED ORDER — FENTANYL CITRATE (PF) 100 MCG/2ML IJ SOLN
25.0000 ug | INTRAMUSCULAR | Status: DC | PRN
  Administered 2022-04-26 (×2): 25 ug via INTRAVENOUS

## 2022-04-26 MED ORDER — LACTATED RINGERS IV SOLN: INTRAVENOUS | Status: DC

## 2022-04-26 MED ORDER — 0.9 % SODIUM CHLORIDE (POUR BTL) OPTIME
TOPICAL | Status: DC | PRN
  Administered 2022-04-26: 1000 mL

## 2022-04-26 MED ORDER — ORAL CARE MOUTH RINSE: 15.0000 mL | Freq: Once | OROMUCOSAL | Status: AC

## 2022-04-26 MED ORDER — LIDOCAINE 2% (20 MG/ML) 5 ML SYRINGE
INTRAMUSCULAR | Status: DC | PRN
  Administered 2022-04-26: 60 mg via INTRAVENOUS

## 2022-04-26 MED ORDER — CHLORHEXIDINE GLUCONATE 0.12 % MT SOLN: 15.0000 mL | Freq: Once | OROMUCOSAL | Status: AC

## 2022-04-26 MED ORDER — PHENYLEPHRINE 80 MCG/ML (10ML) SYRINGE FOR IV PUSH (FOR BLOOD PRESSURE SUPPORT)
PREFILLED_SYRINGE | INTRAVENOUS | Status: DC | PRN
  Administered 2022-04-26 (×2): 80 ug via INTRAVENOUS

## 2022-04-26 MED ORDER — MIDAZOLAM HCL 2 MG/2ML IJ SOLN
INTRAMUSCULAR | Status: AC
  Filled 2022-04-26: qty 2

## 2022-04-26 MED ORDER — LIDOCAINE-EPINEPHRINE 1 %-1:100000 IJ SOLN
INTRAMUSCULAR | Status: DC | PRN
  Administered 2022-04-26: 5 mL

## 2022-04-26 MED ORDER — PROPOFOL 500 MG/50ML IV EMUL
INTRAVENOUS | Status: DC | PRN
  Administered 2022-04-26: 75 ug/kg/min via INTRAVENOUS

## 2022-04-26 MED ORDER — FENTANYL CITRATE (PF) 100 MCG/2ML IJ SOLN
INTRAMUSCULAR | Status: AC
  Filled 2022-04-26: qty 2

## 2022-04-26 MED ORDER — DEXAMETHASONE SODIUM PHOSPHATE 10 MG/ML IJ SOLN
INTRAMUSCULAR | Status: AC
  Filled 2022-04-26: qty 1

## 2022-04-26 MED ORDER — LIDOCAINE-EPINEPHRINE 1 %-1:100000 IJ SOLN
INTRAMUSCULAR | Status: AC
  Filled 2022-04-26: qty 1

## 2022-04-26 MED ORDER — ONDANSETRON HCL 4 MG/2ML IJ SOLN
INTRAMUSCULAR | Status: DC | PRN
  Administered 2022-04-26: 4 mg via INTRAVENOUS

## 2022-04-26 MED ORDER — PROPOFOL 10 MG/ML IV BOLUS
INTRAVENOUS | Status: AC
  Filled 2022-04-26: qty 20

## 2022-04-26 MED ORDER — ACETAMINOPHEN 10 MG/ML IV SOLN
1000.0000 mg | Freq: Once | INTRAVENOUS | Status: DC | PRN
  Administered 2022-04-26: 1000 mg via INTRAVENOUS

## 2022-04-26 MED ORDER — HYDROCODONE-ACETAMINOPHEN 5-325 MG PO TABS
1.0000 | ORAL_TABLET | Freq: Four times a day (QID) | ORAL | 0 refills | Status: DC | PRN
Start: 2022-04-26 — End: 2022-11-07

## 2022-04-26 MED ORDER — OXYCODONE HCL 5 MG PO TABS: 5.0000 mg | ORAL_TABLET | Freq: Once | ORAL | Status: DC | PRN

## 2022-04-26 MED ORDER — ACETAMINOPHEN 10 MG/ML IV SOLN
INTRAVENOUS | Status: AC
  Filled 2022-04-26: qty 100

## 2022-04-26 MED ORDER — FENTANYL CITRATE (PF) 250 MCG/5ML IJ SOLN
INTRAMUSCULAR | Status: AC
  Filled 2022-04-26: qty 5

## 2022-04-26 MED ORDER — CEFAZOLIN SODIUM-DEXTROSE 2-4 GM/100ML-% IV SOLN
2.0000 g | INTRAVENOUS | Status: AC
  Administered 2022-04-26: 2 g via INTRAVENOUS

## 2022-04-26 MED ORDER — PHENYLEPHRINE 80 MCG/ML (10ML) SYRINGE FOR IV PUSH (FOR BLOOD PRESSURE SUPPORT)
PREFILLED_SYRINGE | INTRAVENOUS | Status: AC
  Filled 2022-04-26: qty 10

## 2022-04-26 MED ORDER — OXYCODONE HCL 5 MG/5ML PO SOLN: 5.0000 mg | Freq: Once | ORAL | Status: DC | PRN

## 2022-04-26 MED ORDER — CHLORHEXIDINE GLUCONATE 0.12 % MT SOLN
OROMUCOSAL | Status: AC
  Administered 2022-04-26: 15 mL via OROMUCOSAL
  Filled 2022-04-26: qty 15

## 2022-04-26 MED ORDER — MIDAZOLAM HCL 2 MG/2ML IJ SOLN
INTRAMUSCULAR | Status: DC | PRN
  Administered 2022-04-26: 2 mg via INTRAVENOUS

## 2022-04-26 MED ORDER — LIDOCAINE 2% (20 MG/ML) 5 ML SYRINGE
INTRAMUSCULAR | Status: AC
Start: 2022-04-26 — End: ?
  Filled 2022-04-26: qty 5

## 2022-04-26 MED ORDER — PROPOFOL 10 MG/ML IV BOLUS
INTRAVENOUS | Status: DC | PRN
  Administered 2022-04-26: 160 mg via INTRAVENOUS

## 2022-04-26 MED ORDER — SUCCINYLCHOLINE CHLORIDE 200 MG/10ML IV SOSY
PREFILLED_SYRINGE | INTRAVENOUS | Status: AC
  Filled 2022-04-26: qty 10

## 2022-04-26 MED ORDER — DEXAMETHASONE SODIUM PHOSPHATE 10 MG/ML IJ SOLN
INTRAMUSCULAR | Status: DC | PRN
  Administered 2022-04-26: 5 mg via INTRAVENOUS

## 2022-04-26 MED ORDER — SUCCINYLCHOLINE CHLORIDE 200 MG/10ML IV SOSY
PREFILLED_SYRINGE | INTRAVENOUS | Status: DC | PRN
  Administered 2022-04-26: 160 mg via INTRAVENOUS

## 2022-04-26 MED ORDER — FENTANYL CITRATE (PF) 250 MCG/5ML IJ SOLN
INTRAMUSCULAR | Status: DC | PRN
  Administered 2022-04-26: 100 ug via INTRAVENOUS

## 2022-04-26 MED ORDER — ACETAMINOPHEN 500 MG PO TABS: 1000.0000 mg | ORAL_TABLET | Freq: Once | ORAL | Status: DC | PRN

## 2022-04-26 MED ORDER — PHENYLEPHRINE HCL-NACL 20-0.9 MG/250ML-% IV SOLN
INTRAVENOUS | Status: DC | PRN
  Administered 2022-04-26: 25 ug/min via INTRAVENOUS

## 2022-04-26 MED ORDER — CEFAZOLIN SODIUM-DEXTROSE 2-4 GM/100ML-% IV SOLN
INTRAVENOUS | Status: AC
  Filled 2022-04-26: qty 100

## 2022-04-26 SURGICAL SUPPLY — 69 items
ACC NRSTM 4 TRQ WRNCH STRL (MISCELLANEOUS)
ADH SKN CLS APL DERMABOND .7 (GAUZE/BANDAGES/DRESSINGS) ×1
BAG COUNTER SPONGE SURGICOUNT (BAG) ×2 IMPLANT
BAG SPNG CNTER NS LX DISP (BAG) ×1
BLADE CLIPPER SURG (BLADE) IMPLANT
BLADE SURG 15 STRL LF DISP TIS (BLADE) ×3 IMPLANT
BLADE SURG 15 STRL SS (BLADE) ×6
CANISTER SUCT 3000ML PPV (MISCELLANEOUS) ×2 IMPLANT
CORD BIPOLAR FORCEPS 12FT (ELECTRODE) ×2 IMPLANT
COVER PROBE W GEL 5X96 (DRAPES) ×2 IMPLANT
COVER SURGICAL LIGHT HANDLE (MISCELLANEOUS) ×2 IMPLANT
DERMABOND ADVANCED (GAUZE/BANDAGES/DRESSINGS) ×1
DERMABOND ADVANCED .7 DNX12 (GAUZE/BANDAGES/DRESSINGS) ×2 IMPLANT
DRAPE C-ARM 35X43 STRL (DRAPES) ×2 IMPLANT
DRAPE HEAD BAR (DRAPES) ×2 IMPLANT
DRAPE INCISE IOBAN 66X45 STRL (DRAPES) ×2 IMPLANT
DRAPE MICROSCOPE LEICA 54X105 (DRAPES) ×2 IMPLANT
DRAPE UTILITY XL STRL (DRAPES) ×2 IMPLANT
DRSG TEGADERM 4X4.75 (GAUZE/BANDAGES/DRESSINGS) ×6 IMPLANT
ELECT COATED BLADE 2.86 ST (ELECTRODE) ×2 IMPLANT
ELECT EMG 18 NIMS (NEUROSURGERY SUPPLIES) ×2
ELECT REM PT RETURN 9FT ADLT (ELECTROSURGICAL) ×2
ELECTRODE EMG 18 NIMS (NEUROSURGERY SUPPLIES) ×1 IMPLANT
ELECTRODE REM PT RTRN 9FT ADLT (ELECTROSURGICAL) ×1 IMPLANT
FORCEPS BIPOLAR SPETZLER 8 1.0 (NEUROSURGERY SUPPLIES) ×2 IMPLANT
GAUZE 4X4 16PLY ~~LOC~~+RFID DBL (SPONGE) ×2 IMPLANT
GAUZE SPONGE 4X4 12PLY STRL (GAUZE/BANDAGES/DRESSINGS) ×2 IMPLANT
GAUZE SPONGE 4X4 12PLY STRL LF (GAUZE/BANDAGES/DRESSINGS) ×1 IMPLANT
GENERATOR PULSE INSPIRE (Generator) ×2 IMPLANT
GENERATOR PULSE INSPIRE IV (Generator) ×1 IMPLANT
GLOVE BIO SURGEON STRL SZ 6.5 (GLOVE) IMPLANT
GLOVE BIO SURGEON STRL SZ7.5 (GLOVE) ×2 IMPLANT
GOWN STRL REUS W/ TWL LRG LVL3 (GOWN DISPOSABLE) ×3 IMPLANT
GOWN STRL REUS W/TWL LRG LVL3 (GOWN DISPOSABLE) ×6
KIT BASIN OR (CUSTOM PROCEDURE TRAY) ×2 IMPLANT
KIT NEURO ACCESSORY W/WRENCH (MISCELLANEOUS) IMPLANT
KIT TURNOVER KIT B (KITS) ×2 IMPLANT
LEAD SENSING RESP INSPIRE (Lead) ×2 IMPLANT
LEAD SENSING RESP INSPIRE IV (Lead) ×1 IMPLANT
LEAD SLEEP STIM INSPIRE IV/V (Lead) ×1 IMPLANT
LEAD SLEEP STIMULATION INSPIRE (Lead) ×2 IMPLANT
LOOP VESSEL MAXI BLUE (MISCELLANEOUS) ×2 IMPLANT
LOOP VESSEL MINI RED (MISCELLANEOUS) ×2 IMPLANT
MARKER SKIN DUAL TIP RULER LAB (MISCELLANEOUS) ×4 IMPLANT
NDL HYPO 25GX1X1/2 BEV (NEEDLE) ×1 IMPLANT
NEEDLE HYPO 25GX1X1/2 BEV (NEEDLE) ×2 IMPLANT
NS IRRIG 1000ML POUR BTL (IV SOLUTION) ×2 IMPLANT
PAD ARMBOARD 7.5X6 YLW CONV (MISCELLANEOUS) ×2 IMPLANT
PASSER CATH 38CM DISP (INSTRUMENTS) ×2 IMPLANT
PENCIL SMOKE EVACUATOR (MISCELLANEOUS) ×2 IMPLANT
POSITIONER HEAD DONUT 9IN (MISCELLANEOUS) ×2 IMPLANT
PROBE NERVE STIMULATOR (NEUROSURGERY SUPPLIES) ×2 IMPLANT
REMOTE CONTROL SLEEP INSPIRE (MISCELLANEOUS) ×2 IMPLANT
SET WALTER ACTIVATION W/DRAPE (SET/KITS/TRAYS/PACK) ×2 IMPLANT
SPONGE INTESTINAL PEANUT (DISPOSABLE) ×2 IMPLANT
STAPLER VISISTAT 35W (STAPLE) ×2 IMPLANT
SUT SILK 2 0 SH (SUTURE) ×2 IMPLANT
SUT SILK 3 0 REEL (SUTURE) ×2 IMPLANT
SUT SILK 3 0 SH 30 (SUTURE) ×4 IMPLANT
SUT SILK 3-0 (SUTURE) ×2
SUT SILK 3-0 RB1 30XBRD (SUTURE) ×1
SUT VIC AB 3-0 SH 27 (SUTURE) ×4
SUT VIC AB 3-0 SH 27X BRD (SUTURE) ×2 IMPLANT
SUT VIC AB 4-0 PS2 27 (SUTURE) ×4 IMPLANT
SUTURE SILK 3-0 RB1 30XBRD (SUTURE) ×1 IMPLANT
SYR 10ML LL (SYRINGE) ×2 IMPLANT
TAPE CLOTH SURG 4X10 WHT LF (GAUZE/BANDAGES/DRESSINGS) ×2 IMPLANT
TOWEL GREEN STERILE (TOWEL DISPOSABLE) ×2 IMPLANT
TRAY ENT MC OR (CUSTOM PROCEDURE TRAY) ×2 IMPLANT

## 2022-04-26 NOTE — Brief Op Note (Signed)
04/26/2022  4:21 PM  PATIENT:  Lucas Young  66 y.o. male  PRE-OPERATIVE DIAGNOSIS:  Obstructive Sleep Apnea BMI 31.0-31.9,adult  POST-OPERATIVE DIAGNOSIS:  Obstructive Sleep Apnea BMI 31.0-31.9,adult  PROCEDURE:  Procedure(s): IMPLANTATION OF HYPOGLOSSAL NERVE STIMULATOR (Right)  SURGEON:  Surgeon(s) and Role:    Melida Quitter, MD - Primary  PHYSICIAN ASSISTANT:   ASSISTANTS: RNFA   ANESTHESIA:   general  EBL:  30 mL   BLOOD ADMINISTERED:none  DRAINS: none   LOCAL MEDICATIONS USED:  LIDOCAINE   SPECIMEN:  No Specimen  DISPOSITION OF SPECIMEN:  N/A  COUNTS:  YES  TOURNIQUET:  * No tourniquets in log *  DICTATION: .Note written in EPIC  PLAN OF CARE: Discharge to home after PACU  PATIENT DISPOSITION:  PACU - hemodynamically stable.   Delay start of Pharmacological VTE agent (>24hrs) due to surgical blood loss or risk of bleeding: no

## 2022-04-26 NOTE — Op Note (Signed)

## 2022-04-26 NOTE — Progress Notes (Signed)
Wasted fentanyl 50 mcg/ 1 ml with Memory Dance RN

## 2022-04-26 NOTE — H&P (Signed)
Lucas Young is an 66 y.o. male.   Chief Complaint: sleep apnea HPI: 66 year old male with obstructive sleep apnea who has not been tolerating CPAP.  Past Medical History:  Diagnosis Date   Allergy    Anemia    as a kid per patient   Anxiety    Arthritis    Back pain    Depression    Food allergy    Hyperlipidemia    Hypertension    "white coat syndrome" per patient   Lactose intolerance    Metabolic syndrome    per patient- seeing weight loss MD for this   Morton's neuroma    Obesity    Pneumonia    "walking pneumonia 16-17 yr ago"   Pre-diabetes    history of pre-DM- "out of zone" d/t weight loss   Sleep apnea    uses CPAP    Past Surgical History:  Procedure Laterality Date   APPENDECTOMY     COLONOSCOPY  2010   Dr.Medoff  normal per pt   DRUG INDUCED ENDOSCOPY N/A 02/01/2022   Procedure: DRUG INDUCED SPLEEP ENDOSCOPY;  Surgeon: Melida Quitter, MD;  Location: [place];  Service: ENT;  Laterality: N/A;   UMBILICAL HERNIA REPAIR      Family History  Problem Relation Age of Onset   Hypertension Mother    Sleep apnea Mother    Obesity Mother    Colon polyps Father    Hypertension Father    Hyperlipidemia Father    Heart disease Father    Sleep apnea Father    Obesity Father    Hypertension Maternal Grandmother    Hypertension Maternal Grandfather    Other Neg Hx        low testosterone   Colon cancer Neg Hx    Esophageal cancer Neg Hx    Stomach cancer Neg Hx    Rectal cancer Neg Hx    Social History:  reports that he has never smoked. He has never used smokeless tobacco. He reports that he does not drink alcohol and does not use drugs.  Allergies:  Allergies  Allergen Reactions   Grass Pollen(K-O-R-T-Swt Vern) Other (See Comments)   Molds & Smuts Other (See Comments)   Pollen Extract    Thimerosal Itching    Eye redness     Medications Prior to Admission  Medication Sig Dispense Refill   aspirin EC 325 MG tablet Take  650 mg by mouth daily as needed (headaches).     cabergoline (DOSTINEX) 0.5 MG tablet Take 2 tablets (1 mg total) by mouth 2 (two) times a week. 50 tablet 3   calcium carbonate (TUMS - DOSED IN MG ELEMENTAL CALCIUM) 500 MG chewable tablet Chew 2,000 mg by mouth daily as needed for indigestion or heartburn.     desloratadine (CLARINEX) 5 MG tablet Take 5 mg by mouth daily as needed (allergies).     EPINEPHrine 0.3 mg/0.3 mL IJ SOAJ injection Inject 0.3 mg into the muscle as needed for anaphylaxis.     fluticasone (FLONASE) 50 MCG/ACT nasal spray Place 1 spray into both nostrils daily as needed for allergies.     ibuprofen (ADVIL) 200 MG tablet Take 400 mg by mouth every 8 (eight) hours as needed for moderate pain.     Ketotifen Fumarate (ALAWAY OP) Place 1 drop into both eyes daily as needed (allergies).     montelukast (SINGULAIR) 10 MG tablet Take 10 mg by mouth daily as needed (allergies).  Polyethyl Glycol-Propyl Glycol (SYSTANE OP) Place 1 drop into both eyes daily as needed (dry eyes).     tirzepatide (MOUNJARO) 12.5 MG/0.5ML Pen Inject 1 pen (0.5 MLs) into the skin once a week 2 mL 9   Ascorbic Acid (VITAMIN C PO) Take 1 tablet by mouth daily.     Multiple Vitamin (MULTIVITAMIN) capsule Take 1 capsule by mouth daily.     tirzepatide (MOUNJARO) 15 MG/0.5ML Pen Inject 15 mg into the skin once a week. (Patient not taking: Reported on 04/21/2022) 6 mL 3   VITAMIN D PO Take 1 capsule by mouth daily.     VITAMIN K PO Take 1 capsule by mouth daily.     WEGOVY 1.7 MG/0.75ML SOAJ Inject 1.7 mg into the skin once a week.      No results found for this or any previous visit (from the past 48 hour(s)). No results found.  Review of Systems  All other systems reviewed and are negative.  Blood pressure (!) 162/99, pulse 61, temperature 97.9 F (36.6 C), temperature source Oral, resp. rate 18, height '5\' 6"'$  (1.676 m), weight 86.6 kg, SpO2 95 %. Physical Exam Constitutional:      Appearance:  Normal appearance. He is normal weight.  HENT:     Head: Normocephalic and atraumatic.     Right Ear: External ear normal.     Left Ear: External ear normal.     Nose: Nose normal.     Mouth/Throat:     Mouth: Mucous membranes are moist.     Pharynx: Oropharynx is clear.  Eyes:     Extraocular Movements: Extraocular movements intact.     Conjunctiva/sclera: Conjunctivae normal.     Pupils: Pupils are equal, round, and reactive to light.  Cardiovascular:     Rate and Rhythm: Normal rate.  Pulmonary:     Effort: Pulmonary effort is normal.  Musculoskeletal:     Cervical back: Normal range of motion.  Skin:    General: Skin is warm and dry.  Neurological:     General: No focal deficit present.     Mental Status: He is alert and oriented to person, place, and time.  Psychiatric:        Mood and Affect: Mood normal.        Behavior: Behavior normal.        Thought Content: Thought content normal.        Judgment: Judgment normal.     Assessment/Plan Obstructive sleep apnea and BMI 30.83.  To OR for hypoglossal nerve stimulator placement.  Melida Quitter, MD 04/26/2022, 2:23 PM

## 2022-04-26 NOTE — Anesthesia Procedure Notes (Signed)
Procedure Name: Intubation Date/Time: 04/26/2022 2:44 PM Performed by: Anastasio Auerbach, CRNA Pre-anesthesia Checklist: Patient identified, Emergency Drugs available, Suction available and Patient being monitored Patient Re-evaluated:Patient Re-evaluated prior to induction Oxygen Delivery Method: Circle system utilized Preoxygenation: Pre-oxygenation with 100% oxygen Induction Type: IV induction and Cricoid Pressure applied Ventilation: Mask ventilation without difficulty Laryngoscope Size: Mac and 3 Grade View: Grade I Tube type: Oral Number of attempts: 1 Airway Equipment and Method: Stylet and Oral airway Placement Confirmation: ETT inserted through vocal cords under direct vision, positive ETCO2 and breath sounds checked- equal and bilateral Secured at: 22 cm Tube secured with: Tape Dental Injury: Teeth and Oropharynx as per pre-operative assessment

## 2022-04-26 NOTE — Transfer of Care (Signed)
Immediate Anesthesia Transfer of Care Note  Patient: Lucas Young  Procedure(s) Performed: IMPLANTATION OF HYPOGLOSSAL NERVE STIMULATOR (Right: Neck)  Patient Location: PACU  Anesthesia Type:General  Level of Consciousness: drowsy  Airway & Oxygen Therapy: Patient Spontanous Breathing and Patient connected to nasal cannula oxygen  Post-op Assessment: Report given to RN and Post -op Vital signs reviewed and stable  Post vital signs: Reviewed and stable  Last Vitals:  Vitals Value Taken Time  BP 125/79 04/26/22 1637  Temp    Pulse 64 04/26/22 1638  Resp 16 04/26/22 1638  SpO2 91 % 04/26/22 1638  Vitals shown include unvalidated device data.  Last Pain:  Vitals:   04/26/22 1202  TempSrc:   PainSc: 0-No pain         Complications: No notable events documented.

## 2022-04-26 NOTE — Anesthesia Preprocedure Evaluation (Signed)
Anesthesia Evaluation  Patient identified by MRN, date of birth, ID band Patient awake    Reviewed: Allergy & Precautions, NPO status , Patient's Chart, lab work & pertinent test results  History of Anesthesia Complications Negative for: history of anesthetic complications  Airway Mallampati: II  TM Distance: >3 FB Neck ROM: Full    Dental  (+) Teeth Intact, Dental Advisory Given   Pulmonary neg shortness of breath, sleep apnea , neg COPD, neg recent URI,    breath sounds clear to auscultation       Cardiovascular hypertension, (-) angina(-) Past MI  Rhythm:Regular     Neuro/Psych PSYCHIATRIC DISORDERS Anxiety Depression  Neuromuscular disease    GI/Hepatic negative GI ROS, Neg liver ROS,   Endo/Other  negative endocrine ROS  Renal/GU negative Renal ROS     Musculoskeletal  (+) Arthritis ,   Abdominal   Peds  Hematology negative hematology ROS (+) Lab Results      Component                Value               Date                      WBC                      6.5                 04/04/2022                HGB                      14.5                04/04/2022                HCT                      43.4                04/04/2022                MCV                      85.2                04/04/2022                PLT                      230.0               04/04/2022              Anesthesia Other Findings   Reproductive/Obstetrics                            Anesthesia Physical Anesthesia Plan  ASA: 2  Anesthesia Plan: General   Post-op Pain Management:    Induction: Intravenous and Rapid sequence  PONV Risk Score and Plan: 2 and Ondansetron and Dexamethasone  Airway Management Planned: Oral ETT  Additional Equipment: None  Intra-op Plan:   Post-operative Plan: Extubation in OR  Informed Consent: I have reviewed the patients History and Physical, chart, labs and discussed  the procedure including the risks, benefits and alternatives for the  proposed anesthesia with the patient or authorized representative who has indicated his/her understanding and acceptance.     Dental advisory given  Plan Discussed with: CRNA  Anesthesia Plan Comments:         Anesthesia Quick Evaluation

## 2022-04-27 ENCOUNTER — Encounter (HOSPITAL_COMMUNITY): Payer: Self-pay | Admitting: Otolaryngology

## 2022-04-27 NOTE — Progress Notes (Signed)
PCP - Dr Dimas Chyle  Cardiologist - n/a Endocrinology - Dr Renato Shin  Chest x-ray - 04/26/22 (1V) EKG - 01/25/22 Stress Test - > 12 yrs ago, Normal per patient ECHO - n/a Cardiac Cath - n/a  ICD Pacemaker/Loop - n/a  Sleep Study -  Yes CPAP - uses CPAP  Aspirin Instructions: Follow your surgeon's instructions on when to stop aspirin prior to surgery,  If no instructions were given by your surgeon then you will need to call the office for those instructions.  Anesthesia review: Yes  STOP now taking any Aspirin (unless otherwise instructed by your surgeon), Aleve, Naproxen, Ibuprofen, Motrin, Advil, Goody's, BC's, all herbal medications, fish oil, and all vitamins.   Coronavirus Screening Do you have any of the following symptoms:  Cough yes/no: No Fever (>100.48F)  yes/no: No Runny nose yes/no: No Sore throat yes/no: No Difficulty breathing/shortness of breath  yes/no: No  Have you traveled in the last 14 days and where? yes/no: No  Patient verbalized understanding of instructions that were given via phone.

## 2022-04-27 NOTE — Anesthesia Postprocedure Evaluation (Signed)
Anesthesia Post Note  Patient: Lucas Young  Procedure(s) Performed: IMPLANTATION OF HYPOGLOSSAL NERVE STIMULATOR (Right: Neck)     Patient location during evaluation: PACU Anesthesia Type: General Level of consciousness: awake and alert Pain management: pain level controlled Vital Signs Assessment: post-procedure vital signs reviewed and stable Respiratory status: spontaneous breathing, nonlabored ventilation, respiratory function stable and patient connected to nasal cannula oxygen Cardiovascular status: blood pressure returned to baseline and stable Postop Assessment: no apparent nausea or vomiting Anesthetic complications: no   No notable events documented.  Last Vitals:  Vitals:   04/26/22 1725 04/26/22 1740  BP: 137/84 (!) 155/98  Pulse: 60 64  Resp: 13 15  Temp:  (!) 36.2 C  SpO2: 92% 93%    Last Pain:  Vitals:   04/26/22 1740  TempSrc:   PainSc: 0-No pain                 Lopez Luvinia Lucy

## 2022-04-28 ENCOUNTER — Ambulatory Visit (HOSPITAL_COMMUNITY): Payer: BC Managed Care – PPO | Admitting: Physician Assistant

## 2022-04-28 ENCOUNTER — Ambulatory Visit (HOSPITAL_COMMUNITY)
Admission: RE | Admit: 2022-04-28 | Discharge: 2022-04-28 | Disposition: A | Discharge status: Home / Self Care | Payer: BC Managed Care – PPO | Attending: Otolaryngology | Admitting: Otolaryngology

## 2022-04-28 ENCOUNTER — Other Ambulatory Visit: Payer: Self-pay

## 2022-04-28 ENCOUNTER — Encounter (HOSPITAL_COMMUNITY)
Admission: RE | Disposition: A | Discharge status: Home / Self Care | Payer: Self-pay | Source: Home / Self Care | Attending: Otolaryngology

## 2022-04-28 ENCOUNTER — Encounter (HOSPITAL_COMMUNITY): Payer: Self-pay | Admitting: Otolaryngology

## 2022-04-28 DIAGNOSIS — M96841 Postprocedural hematoma of a musculoskeletal structure following other procedure: Secondary | ICD-10-CM | POA: Diagnosis present

## 2022-04-28 DIAGNOSIS — Z8249 Family history of ischemic heart disease and other diseases of the circulatory system: Secondary | ICD-10-CM | POA: Insufficient documentation

## 2022-04-28 DIAGNOSIS — I119 Hypertensive heart disease without heart failure: Secondary | ICD-10-CM | POA: Diagnosis not present

## 2022-04-28 DIAGNOSIS — G4730 Sleep apnea, unspecified: Secondary | ICD-10-CM | POA: Insufficient documentation

## 2022-04-28 DIAGNOSIS — M1990 Unspecified osteoarthritis, unspecified site: Secondary | ICD-10-CM | POA: Insufficient documentation

## 2022-04-28 HISTORY — PX: HEMATOMA EVACUATION: SHX5118

## 2022-04-28 SURGERY — EVACUATION HEMATOMA
Anesthesia: General | Site: Neck | Laterality: Right

## 2022-04-28 MED ORDER — OXYCODONE HCL 5 MG/5ML PO SOLN: 5.0000 mg | Freq: Once | ORAL | Status: DC | PRN

## 2022-04-28 MED ORDER — SUCCINYLCHOLINE CHLORIDE 200 MG/10ML IV SOSY
PREFILLED_SYRINGE | INTRAVENOUS | Status: DC | PRN
  Administered 2022-04-28: 200 mg via INTRAVENOUS

## 2022-04-28 MED ORDER — ROCURONIUM BROMIDE 10 MG/ML (PF) SYRINGE
PREFILLED_SYRINGE | INTRAVENOUS | Status: DC | PRN
  Administered 2022-04-28: 10 mg via INTRAVENOUS
  Administered 2022-04-28: 20 mg via INTRAVENOUS

## 2022-04-28 MED ORDER — LACTATED RINGERS IV SOLN
INTRAVENOUS | Status: DC
Start: 2022-04-28 — End: 2022-04-28

## 2022-04-28 MED ORDER — SUCCINYLCHOLINE CHLORIDE 200 MG/10ML IV SOSY
PREFILLED_SYRINGE | INTRAVENOUS | Status: AC
  Filled 2022-04-28: qty 10

## 2022-04-28 MED ORDER — PROPOFOL 10 MG/ML IV BOLUS
INTRAVENOUS | Status: DC | PRN
  Administered 2022-04-28: 30 mg via INTRAVENOUS
  Administered 2022-04-28: 150 mg via INTRAVENOUS

## 2022-04-28 MED ORDER — CHLORHEXIDINE GLUCONATE 0.12 % MT SOLN: 15.0000 mL | Freq: Once | OROMUCOSAL | Status: AC

## 2022-04-28 MED ORDER — CHLORHEXIDINE GLUCONATE 0.12 % MT SOLN
OROMUCOSAL | Status: AC
  Administered 2022-04-28: 15 mL via OROMUCOSAL
  Filled 2022-04-28: qty 15

## 2022-04-28 MED ORDER — MIDAZOLAM HCL 2 MG/2ML IJ SOLN
INTRAMUSCULAR | Status: AC
  Filled 2022-04-28: qty 2

## 2022-04-28 MED ORDER — ONDANSETRON HCL 4 MG/2ML IJ SOLN
INTRAMUSCULAR | Status: DC | PRN
  Administered 2022-04-28: 4 mg via INTRAVENOUS

## 2022-04-28 MED ORDER — ORAL CARE MOUTH RINSE: 15.0000 mL | Freq: Once | OROMUCOSAL | Status: AC

## 2022-04-28 MED ORDER — ONDANSETRON HCL 4 MG/2ML IJ SOLN
INTRAMUSCULAR | Status: AC
  Filled 2022-04-28: qty 2

## 2022-04-28 MED ORDER — FENTANYL CITRATE (PF) 250 MCG/5ML IJ SOLN
INTRAMUSCULAR | Status: DC | PRN
  Administered 2022-04-28: 50 ug via INTRAVENOUS

## 2022-04-28 MED ORDER — DEXAMETHASONE SODIUM PHOSPHATE 10 MG/ML IJ SOLN
INTRAMUSCULAR | Status: DC | PRN
  Administered 2022-04-28: 10 mg via INTRAVENOUS

## 2022-04-28 MED ORDER — ONDANSETRON HCL 4 MG/2ML IJ SOLN: 4.0000 mg | Freq: Once | INTRAMUSCULAR | Status: DC | PRN

## 2022-04-28 MED ORDER — PHENYLEPHRINE 80 MCG/ML (10ML) SYRINGE FOR IV PUSH (FOR BLOOD PRESSURE SUPPORT)
PREFILLED_SYRINGE | INTRAVENOUS | Status: DC | PRN
  Administered 2022-04-28 (×3): 80 ug via INTRAVENOUS

## 2022-04-28 MED ORDER — FENTANYL CITRATE (PF) 100 MCG/2ML IJ SOLN
INTRAMUSCULAR | Status: AC
  Filled 2022-04-28: qty 2

## 2022-04-28 MED ORDER — LIDOCAINE 2% (20 MG/ML) 5 ML SYRINGE
INTRAMUSCULAR | Status: DC | PRN
  Administered 2022-04-28: 60 mg via INTRAVENOUS

## 2022-04-28 MED ORDER — KETOROLAC TROMETHAMINE 30 MG/ML IJ SOLN
INTRAMUSCULAR | Status: AC
  Filled 2022-04-28: qty 1

## 2022-04-28 MED ORDER — FENTANYL CITRATE (PF) 100 MCG/2ML IJ SOLN: 25.0000 ug | INTRAMUSCULAR | Status: DC | PRN

## 2022-04-28 MED ORDER — FENTANYL CITRATE (PF) 100 MCG/2ML IJ SOLN
25.0000 ug | INTRAMUSCULAR | Status: DC | PRN
  Administered 2022-04-28: 50 ug via INTRAVENOUS

## 2022-04-28 MED ORDER — FENTANYL CITRATE (PF) 250 MCG/5ML IJ SOLN
INTRAMUSCULAR | Status: AC
  Filled 2022-04-28: qty 5

## 2022-04-28 MED ORDER — SUGAMMADEX SODIUM 200 MG/2ML IV SOLN
INTRAVENOUS | Status: DC | PRN
  Administered 2022-04-28: 200 mg via INTRAVENOUS

## 2022-04-28 MED ORDER — PHENYLEPHRINE 80 MCG/ML (10ML) SYRINGE FOR IV PUSH (FOR BLOOD PRESSURE SUPPORT)
PREFILLED_SYRINGE | INTRAVENOUS | Status: AC
  Filled 2022-04-28: qty 10

## 2022-04-28 MED ORDER — ROCURONIUM BROMIDE 10 MG/ML (PF) SYRINGE
PREFILLED_SYRINGE | INTRAVENOUS | Status: AC
  Filled 2022-04-28: qty 10

## 2022-04-28 MED ORDER — LIDOCAINE-EPINEPHRINE 1 %-1:100000 IJ SOLN
INTRAMUSCULAR | Status: AC
  Filled 2022-04-28: qty 1

## 2022-04-28 MED ORDER — LIDOCAINE 2% (20 MG/ML) 5 ML SYRINGE
INTRAMUSCULAR | Status: AC
  Filled 2022-04-28: qty 5

## 2022-04-28 MED ORDER — DEXAMETHASONE SODIUM PHOSPHATE 10 MG/ML IJ SOLN
INTRAMUSCULAR | Status: AC
  Filled 2022-04-28: qty 1

## 2022-04-28 MED ORDER — BACITRACIN ZINC 500 UNIT/GM EX OINT
TOPICAL_OINTMENT | CUTANEOUS | Status: AC
  Filled 2022-04-28: qty 28.35

## 2022-04-28 MED ORDER — MIDAZOLAM HCL 2 MG/2ML IJ SOLN
INTRAMUSCULAR | Status: DC | PRN
  Administered 2022-04-28: 2 mg via INTRAVENOUS

## 2022-04-28 MED ORDER — OXYCODONE HCL 5 MG PO TABS: 5.0000 mg | ORAL_TABLET | Freq: Once | ORAL | Status: DC | PRN

## 2022-04-28 MED ORDER — PROPOFOL 10 MG/ML IV BOLUS
INTRAVENOUS | Status: AC
  Filled 2022-04-28: qty 20

## 2022-04-28 MED ORDER — KETOROLAC TROMETHAMINE 30 MG/ML IJ SOLN
30.0000 mg | Freq: Once | INTRAMUSCULAR | Status: AC | PRN
  Administered 2022-04-28: 30 mg via INTRAVENOUS

## 2022-04-28 MED ORDER — 0.9 % SODIUM CHLORIDE (POUR BTL) OPTIME
TOPICAL | Status: DC | PRN
  Administered 2022-04-28: 1000 mL

## 2022-04-28 SURGICAL SUPPLY — 58 items
ADH SKN CLS APL DERMABOND .7 (GAUZE/BANDAGES/DRESSINGS) ×1
ATTRACTOMAT 16X20 MAGNETIC DRP (DRAPES) IMPLANT
BAG COUNTER SPONGE SURGICOUNT (BAG) ×2 IMPLANT
BAG SPNG CNTER NS LX DISP (BAG) ×1
BLADE SURG 15 STRL LF DISP TIS (BLADE) IMPLANT
BLADE SURG 15 STRL SS (BLADE)
BNDG CONFORM 2 STRL LF (GAUZE/BANDAGES/DRESSINGS) IMPLANT
BNDG GAUZE ELAST 4 BULKY (GAUZE/BANDAGES/DRESSINGS) IMPLANT
CANISTER SUCT 3000ML PPV (MISCELLANEOUS) IMPLANT
CATH ROBINSON RED A/P 16FR (CATHETERS) IMPLANT
CLEANER TIP ELECTROSURG 2X2 (MISCELLANEOUS) ×2 IMPLANT
CNTNR URN SCR LID CUP LEK RST (MISCELLANEOUS) ×1 IMPLANT
CONT SPEC 4OZ STRL OR WHT (MISCELLANEOUS) ×2
CORD BIPOLAR FORCEPS 12FT (ELECTRODE) ×1 IMPLANT
COVER SURGICAL LIGHT HANDLE (MISCELLANEOUS) ×2 IMPLANT
DERMABOND ADVANCED (GAUZE/BANDAGES/DRESSINGS) ×1
DERMABOND ADVANCED .7 DNX12 (GAUZE/BANDAGES/DRESSINGS) IMPLANT
DRAIN PENROSE 1/4X12 LTX STRL (WOUND CARE) IMPLANT
DRAPE HALF SHEET 40X57 (DRAPES) IMPLANT
DRSG EMULSION OIL 3X3 NADH (GAUZE/BANDAGES/DRESSINGS) IMPLANT
ELECT COATED BLADE 2.86 ST (ELECTRODE) ×2 IMPLANT
ELECT NDL TIP 2.8 STRL (NEEDLE) IMPLANT
ELECT NEEDLE TIP 2.8 STRL (NEEDLE) IMPLANT
ELECT REM PT RETURN 9FT ADLT (ELECTROSURGICAL) ×2
ELECTRODE REM PT RTRN 9FT ADLT (ELECTROSURGICAL) ×1 IMPLANT
FORCEPS BIPOLAR SPETZLER 8 1.0 (NEUROSURGERY SUPPLIES) ×1 IMPLANT
GAUZE 4X4 16PLY ~~LOC~~+RFID DBL (SPONGE) IMPLANT
GAUZE SPONGE 4X4 12PLY STRL (GAUZE/BANDAGES/DRESSINGS) IMPLANT
GLOVE ECLIPSE 7.5 STRL STRAW (GLOVE) ×2 IMPLANT
GOWN STRL REUS W/ TWL LRG LVL3 (GOWN DISPOSABLE) ×2 IMPLANT
GOWN STRL REUS W/TWL LRG LVL3 (GOWN DISPOSABLE) ×4
KIT BASIN OR (CUSTOM PROCEDURE TRAY) ×2 IMPLANT
KIT TURNOVER KIT B (KITS) ×2 IMPLANT
NDL 25GX 5/8IN NON SAFETY (NEEDLE) IMPLANT
NDL HYPO 25GX1X1/2 BEV (NEEDLE) IMPLANT
NEEDLE 25GX 5/8IN NON SAFETY (NEEDLE) IMPLANT
NEEDLE HYPO 25GX1X1/2 BEV (NEEDLE) IMPLANT
NS IRRIG 1000ML POUR BTL (IV SOLUTION) ×2 IMPLANT
PAD ARMBOARD 7.5X6 YLW CONV (MISCELLANEOUS) ×4 IMPLANT
PENCIL SMOKE EVACUATOR (MISCELLANEOUS) ×2 IMPLANT
POSITIONER HEAD DONUT 9IN (MISCELLANEOUS) IMPLANT
SUT CHROMIC 4 0 P 3 18 (SUTURE) ×2 IMPLANT
SUT ETHILON 4 0 PS 2 18 (SUTURE) ×2 IMPLANT
SUT ETHILON 5 0 P 3 18 (SUTURE) ×2
SUT NYLON ETHILON 5-0 P-3 1X18 (SUTURE) ×1 IMPLANT
SUT SILK 4 0 (SUTURE) ×2
SUT SILK 4-0 18XBRD TIE 12 (SUTURE) ×1 IMPLANT
SUT VIC AB 3-0 SH 27 (SUTURE) ×2
SUT VIC AB 3-0 SH 27XBRD (SUTURE) IMPLANT
SUT VICRYL 4-0 PS2 18IN ABS (SUTURE) ×1 IMPLANT
SWAB COLLECTION DEVICE MRSA (MISCELLANEOUS) IMPLANT
SWAB CULTURE ESWAB REG 1ML (MISCELLANEOUS) IMPLANT
SYR BULB IRRIG 60ML STRL (SYRINGE) IMPLANT
SYR TB 1ML LUER SLIP (SYRINGE) IMPLANT
TOWEL GREEN STERILE FF (TOWEL DISPOSABLE) ×2 IMPLANT
TRAY ENT MC OR (CUSTOM PROCEDURE TRAY) ×2 IMPLANT
WATER STERILE IRR 1000ML POUR (IV SOLUTION) ×2 IMPLANT
YANKAUER SUCT BULB TIP NO VENT (SUCTIONS) IMPLANT

## 2022-04-28 NOTE — Transfer of Care (Signed)
Immediate Anesthesia Transfer of Care Note  Patient: Lucas Young  Procedure(s) Performed: EVACUATION HEMATOMA OF NECK (Right: Neck)  Patient Location: PACU  Anesthesia Type:General  Level of Consciousness: drowsy  Airway & Oxygen Therapy: Patient Spontanous Breathing and Patient connected to face mask oxygen  Post-op Assessment: Report given to RN and Post -op Vital signs reviewed and stable  Post vital signs: Reviewed and stable  Last Vitals:  Vitals Value Taken Time  BP 137/91 04/28/22 1316  Temp 36.8 C 04/28/22 1315  Pulse 65 04/28/22 1321  Resp 27 04/28/22 1321  SpO2 99 % 04/28/22 1321  Vitals shown include unvalidated device data.  Last Pain:  Vitals:   04/28/22 1315  TempSrc:   PainSc: Asleep      Patients Stated Pain Goal: 2 (02/33/43 5686)  Complications: No notable events documented.

## 2022-04-28 NOTE — Anesthesia Procedure Notes (Signed)
Procedure Name: Intubation Date/Time: 04/28/2022 12:32 PM Performed by: Carolan Clines, CRNA Pre-anesthesia Checklist: Patient identified, Emergency Drugs available, Suction available and Patient being monitored Patient Re-evaluated:Patient Re-evaluated prior to induction Oxygen Delivery Method: Circle System Utilized Preoxygenation: Pre-oxygenation with 100% oxygen Induction Type: IV induction and Rapid sequence Laryngoscope Size: Glidescope and 4 Grade View: Grade I Tube type: Oral Tube size: 7.0 mm Number of attempts: 1 Airway Equipment and Method: Rigid stylet and Video-laryngoscopy Placement Confirmation: ETT inserted through vocal cords under direct vision, positive ETCO2 and breath sounds checked- equal and bilateral Secured at: 24 cm Tube secured with: Tape Dental Injury: Teeth and Oropharynx as per pre-operative assessment  Difficulty Due To: Difficulty was anticipated Comments: Elective glidescope intubation d/t neck hematoma/swelling.

## 2022-04-28 NOTE — Anesthesia Preprocedure Evaluation (Addendum)
Anesthesia Evaluation  Patient identified by MRN, date of birth, ID band Patient awake    Reviewed: Allergy & Precautions, NPO status , Patient's Chart, lab work & pertinent test results  Airway Mallampati: III  TM Distance: >3 FB Neck ROM: Full    Dental no notable dental hx. (+) Teeth Intact, Dental Advisory Given   Pulmonary sleep apnea (does not use CPAP) ,    Pulmonary exam normal breath sounds clear to auscultation       Cardiovascular hypertension, Normal cardiovascular exam Rhythm:Regular Rate:Normal     Neuro/Psych PSYCHIATRIC DISORDERS Anxiety Depression negative neurological ROS     GI/Hepatic negative GI ROS, Neg liver ROS,   Endo/Other  negative endocrine ROS  Renal/GU negative Renal ROS  negative genitourinary   Musculoskeletal  (+) Arthritis ,   Abdominal   Peds negative pediatric ROS (+)  Hematology negative hematology ROS (+)   Anesthesia Other Findings Hypoglossal nerve stimulator placed 2 days ago now with anterior neck hematoma. No airway compromise.   Reproductive/Obstetrics negative OB ROS                           Anesthesia Physical Anesthesia Plan  ASA: 3  Anesthesia Plan: General   Post-op Pain Management: Minimal or no pain anticipated   Induction: Intravenous and Rapid sequence  PONV Risk Score and Plan: 2 and Ondansetron, Dexamethasone and Treatment may vary due to age or medical condition  Airway Management Planned: Oral ETT and Video Laryngoscope Planned  Additional Equipment:   Intra-op Plan:   Post-operative Plan: Extubation in OR  Informed Consent: I have reviewed the patients History and Physical, chart, labs and discussed the procedure including the risks, benefits and alternatives for the proposed anesthesia with the patient or authorized representative who has indicated his/her understanding and acceptance.     Dental advisory  given  Plan Discussed with: CRNA and Surgeon  Anesthesia Plan Comments:       Anesthesia Quick Evaluation

## 2022-04-28 NOTE — H&P (Signed)
Lucas Young is an 66 y.o. male.   Chief Complaint: Neck hematoma after hypoglossal nerve stimulator placement HPI: 66 year old male underwent hypoglossal nerve stimulator placement two days ago.  On POD 1, he developed marked and worsening fullness at the neck site consistent with hematoma.  With difficulty swallowing and discomfort, he presents for evacuation of the hematoma.  Past Medical History:  Diagnosis Date   Allergy    Anemia    as a kid per patient   Anxiety    Arthritis    Back pain    Depression    Food allergy    Hyperlipidemia    Hypertension    "white coat syndrome" per patient   Lactose intolerance    Metabolic syndrome    per patient- seeing weight loss MD for this   Morton's neuroma    Obesity    Pneumonia    "walking pneumonia 16-17 yr ago"   Pre-diabetes    history of pre-DM- "out of zone" d/t weight loss   Sleep apnea    uses CPAP    Past Surgical History:  Procedure Laterality Date   APPENDECTOMY     COLONOSCOPY  2010   Dr.Medoff  normal per pt   DRUG INDUCED ENDOSCOPY N/A 02/01/2022   Procedure: DRUG INDUCED SPLEEP ENDOSCOPY;  Surgeon: Melida Quitter, MD;  Location: [facility];  Service: ENT;  Laterality: N/A;   IMPLANTATION OF HYPOGLOSSAL NERVE STIMULATOR Right 04/26/2022   Procedure: IMPLANTATION OF HYPOGLOSSAL NERVE STIMULATOR;  Surgeon: Melida Quitter, MD;  Location: [facility];  Service: ENT;  Laterality: Right;   UMBILICAL HERNIA REPAIR      Family History  Problem Relation Age of Onset   Hypertension Mother    Sleep apnea Mother    Obesity Mother    Colon polyps Father    Hypertension Father    Hyperlipidemia Father    Heart disease Father    Sleep apnea Father    Obesity Father    Hypertension Maternal Grandmother    Hypertension Maternal Grandfather    Other Neg Hx        low testosterone   Colon cancer Neg Hx    Esophageal cancer Neg Hx    Stomach cancer Neg Hx    Rectal cancer Neg Hx    Social History:   reports that he has never smoked. He has never used smokeless tobacco. He reports that he does not drink alcohol and does not use drugs.  Allergies:  Allergies  Allergen Reactions   Grass Pollen(K-O-R-T-Swt Vern) Other (See Comments)   Molds & Smuts Other (See Comments)   Pollen Extract    Thimerosal Itching    Eye redness     Medications Prior to Admission  Medication Sig Dispense Refill   Ascorbic Acid (VITAMIN C PO) Take 1 tablet by mouth daily.     aspirin EC 325 MG tablet Take 650 mg by mouth daily as needed (headaches).     cabergoline (DOSTINEX) 0.5 MG tablet Take 2 tablets (1 mg total) by mouth 2 (two) times a week. 50 tablet 3   calcium carbonate (TUMS - DOSED IN MG ELEMENTAL CALCIUM) 500 MG chewable tablet Chew 2,000 mg by mouth daily as needed for indigestion or heartburn.     desloratadine (CLARINEX) 5 MG tablet Take 5 mg by mouth daily as needed (allergies).     fluticasone (FLONASE) 50 MCG/ACT nasal spray Place 1 spray into both nostrils daily as needed for allergies.  HYDROcodone-acetaminophen (NORCO/VICODIN) 5-325 MG tablet Take 1-2 tablets by mouth every 6 (six) hours as needed for moderate pain. 12 tablet 0   ibuprofen (ADVIL) 200 MG tablet Take 400 mg by mouth every 8 (eight) hours as needed for moderate pain.     Ketotifen Fumarate (ALAWAY OP) Place 1 drop into both eyes daily as needed (allergies).     montelukast (SINGULAIR) 10 MG tablet Take 10 mg by mouth daily as needed (allergies).     Multiple Vitamin (MULTIVITAMIN) capsule Take 1 capsule by mouth daily.     Polyethyl Glycol-Propyl Glycol (SYSTANE OP) Place 1 drop into both eyes daily as needed (dry eyes).     tirzepatide (MOUNJARO) 12.5 MG/0.5ML Pen Inject 1 pen (0.5 MLs) into the skin once a week 2 mL 9   tirzepatide (MOUNJARO) 15 MG/0.5ML Pen Inject 15 mg into the skin once a week. 6 mL 3   VITAMIN D PO Take 1 capsule by mouth daily.     VITAMIN K PO Take 1 capsule by mouth daily.     EPINEPHrine 0.3  mg/0.3 mL IJ SOAJ injection Inject 0.3 mg into the muscle as needed for anaphylaxis.     WEGOVY 1.7 MG/0.75ML SOAJ Inject 1.7 mg into the skin once a week.      No results found for this or any previous visit (from the past 48 hour(s)). DG Neck Soft Tissue  Result Date: 04/26/2022 CLINICAL DATA:  Hypoglossal nerve stimulator placement EXAM: NECK SOFT TISSUES - 1+ VIEW COMPARISON:  12/23/2020 FINDINGS: Cross-table lateral view of the soft tissues of the neck are obtained. Nerve stimulator lead is identified within the anterior soft tissues, terminating in the submandibular region. Airways patent. Stable spondylosis of the cervical spine, with bony fusion across C3-4 as before. IMPRESSION: 1. There stimulator lead within the anterior soft tissues, terminating in the submandibular region. Electronically Signed   By: Randa Ngo M.D.   On: 04/26/2022 17:38   DG Chest 1 View  Result Date: 04/26/2022 CLINICAL DATA:  Placement of hypoglossal nerve stimulator EXAM: CHEST  1 VIEW COMPARISON:  03/25/2006 FINDINGS: Transverse diameter of heart is increased. There is patchy infiltrate in the medial left lower lung fields. There are no signs of pulmonary edema. There is no pleural effusion or pneumothorax. There is an electronic battery in the right infraclavicular region with lead extending to the right side of neck. IMPRESSION: Cardiomegaly. There is patchy infiltrate in the medial left lower lung fields, possibly suggesting atelectasis/pneumonia. Short-term follow-up chest radiographs should be considered to rule out any underlying neoplastic process in the left lower lobe. Electronically Signed   By: Elmer Picker M.D.   On: 04/26/2022 17:36    Review of Systems  All other systems reviewed and are negative.  Blood pressure (!) 159/91, pulse 67, temperature 98.7 F (37.1 C), temperature source Oral, resp. rate 17, height '5\' 6"'$  (1.676 m), weight 86.6 kg, SpO2 96 %. Physical Exam Constitutional:       Appearance: Normal appearance. He is normal weight.  HENT:     Head: Normocephalic and atraumatic.     Right Ear: External ear normal.     Left Ear: External ear normal.     Nose: Nose normal.     Mouth/Throat:     Mouth: Mucous membranes are moist.     Pharynx: Oropharynx is clear.  Eyes:     Extraocular Movements: Extraocular movements intact.     Pupils: Pupils are equal, round, and reactive to light.  Neck:     Comments: Right incision clean and intact, marked fullness deep to incision, bruising of skin. Cardiovascular:     Rate and Rhythm: Normal rate.  Pulmonary:     Effort: Pulmonary effort is normal.  Skin:    General: Skin is warm and dry.  Neurological:     General: No focal deficit present.     Mental Status: He is alert and oriented to person, place, and time.  Psychiatric:        Mood and Affect: Mood normal.        Behavior: Behavior normal.        Thought Content: Thought content normal.        Judgment: Judgment normal.     Assessment/Plan Neck hematoma  To OR for evacuation of the neck hematoma.  Melida Quitter, MD 04/28/2022, 12:11 PM

## 2022-04-28 NOTE — Anesthesia Postprocedure Evaluation (Signed)
Anesthesia Post Note  Patient: Lucas Young  Procedure(s) Performed: EVACUATION HEMATOMA OF NECK (Right: Neck)     Patient location during evaluation: PACU Anesthesia Type: General Level of consciousness: awake and alert Pain management: pain level controlled Vital Signs Assessment: post-procedure vital signs reviewed and stable Respiratory status: spontaneous breathing, nonlabored ventilation, respiratory function stable and patient connected to nasal cannula oxygen Cardiovascular status: blood pressure returned to baseline and stable Postop Assessment: no apparent nausea or vomiting Anesthetic complications: no   No notable events documented.  Last Vitals:  Vitals:   04/28/22 1345 04/28/22 1400  BP: 118/82 (!) 145/93  Pulse: 65 63  Resp: 12 15  Temp: 37.2 C 37.1 C  SpO2: 92% 94%    Last Pain:  Vitals:   04/28/22 1400  TempSrc:   PainSc: 2                  Lucas Young

## 2022-04-28 NOTE — Brief Op Note (Signed)
04/28/2022  1:02 PM  PATIENT:  Rosa Gambale  66 y.o. male  PRE-OPERATIVE DIAGNOSIS:  Hematoma of neck, initial encounter  POST-OPERATIVE DIAGNOSIS:  Hematoma of neck, initial encounter  PROCEDURE:  Procedure(s): EVACUATION HEMATOMA OF NECK (Right)  SURGEON:  Surgeon(s) and Role:    Melida Quitter, MD - Primary  PHYSICIAN ASSISTANT:   ASSISTANTS: none   ANESTHESIA:   general  EBL:  Minimal   BLOOD ADMINISTERED:none  DRAINS: none   LOCAL MEDICATIONS USED:  NONE  SPECIMEN:  No Specimen  DISPOSITION OF SPECIMEN:  N/A  COUNTS:  YES  TOURNIQUET:  * No tourniquets in log *  DICTATION: .Note written in EPIC  PLAN OF CARE: Discharge to home after PACU  PATIENT DISPOSITION:  PACU - hemodynamically stable.   Delay start of Pharmacological VTE agent (>24hrs) due to surgical blood loss or risk of bleeding: no

## 2022-04-28 NOTE — Op Note (Signed)
Preop diagnosis: Right neck hematoma Postop diagnosis: same Procedure: Evacuation of right neck hematoma Surgeon: Redmond Baseman Assist: None Anesth: General Compl: None Findings: Clot filling neck surgical site.  Stimulating lead remains in place and intact.  Minor bleeding sites cauterized. Description:  After discussing risks, benefits, and alternatives, the patient was brought to the operative suite and placed on the operative table in the supine position.  Anesthesia was induced and the patient was intubated by the anesthesia team without difficulty.  The neck was prepped and draped in sterile fashion.  The right neck incision was opened using scissors.  Clot was encountered and cleared out.  The platysma suture line was then opened and more clot was encountered.  Clot was fully removed.  The stimulating lead was kept in view through this process and did not appear to become displaced.  After clearing clot, the space was copiously irrigated with saline.  A couple of minor bleeding sites were cauterized with Bipolar electrocautery near the hyoid bone.  The patient was given a Valsalva and no additional bleeding was witnessed.  The platysma layer was closed with 3-0 Vicryl, the subcutaneous layer with 4-0 Vicryl, and the skin with Dermabond.  He was then returned to anesthesia for wake-up and was extubated and moved to the recovery room in stable condition.

## 2022-04-28 NOTE — Progress Notes (Signed)
Wasted 84mh Fentanyl+ 132mwith kaUlice DashN

## 2022-04-29 ENCOUNTER — Encounter (HOSPITAL_COMMUNITY): Payer: Self-pay | Admitting: Otolaryngology

## 2022-04-30 ENCOUNTER — Other Ambulatory Visit (HOSPITAL_COMMUNITY): Payer: Self-pay

## 2022-05-10 ENCOUNTER — Encounter: Payer: Self-pay | Admitting: Family Medicine

## 2022-05-11 ENCOUNTER — Ambulatory Visit: Payer: BC Managed Care – PPO | Admitting: Family Medicine

## 2022-06-09 NOTE — Progress Notes (Signed)
[facility] [place] [facility] Phone: [phone] Subjective:   [name], am serving as a scribe for Dr. Hulan Saas.   I'm seeing this patient by the request  of:  Vivi Barrack, MD  CC: Neck and back pain follow-up  [id number]  Amear Strojny is a 66 y.o. male coming in with complaint of back and neck pain. OMT 03/23/2022. Patient states that he had a neck spasm last week. Intermittent pain in lumbar spine.  Patient does have some tightness noted but nothing severe.  Medications patient has been prescribed: None  Taking:         Reviewed prior external information including notes and imaging from previsou exam, outside providers and external EMR if available.   As well as notes that were available from care everywhere and other healthcare systems.  Past medical history, social, surgical and family history all reviewed in electronic medical record.  No pertanent information unless stated regarding to the chief complaint.   Past Medical History:  Diagnosis Date   Allergy    Anemia    as a kid per patient   Anxiety    Arthritis    Back pain    Depression    Food allergy    Hyperlipidemia    Hypertension    "white coat syndrome" per patient   Lactose intolerance    Metabolic syndrome    per patient- seeing weight loss MD for this   Morton's neuroma    Obesity    Pneumonia    "walking pneumonia 16-17 yr ago"   Pre-diabetes    history of pre-DM- "out of zone" d/t weight loss   Sleep apnea    uses CPAP    Allergies  Allergen Reactions   Grass Pollen(K-O-R-T-Swt Vern) Other (See Comments)   Molds & Smuts Other (See Comments)   Pollen Extract    Thimerosal Itching    Eye redness      Review of Systems:  No headache, visual changes, nausea, vomiting, diarrhea, constipation, dizziness, abdominal pain, skin rash, fevers, chills, night sweats, weight loss, swollen lymph nodes, body  aches, joint swelling, chest pain, shortness of breath, mood changes. POSITIVE muscle aches  Objective  Blood pressure 132/90, pulse 65, height '5\' 6"'$  (1.676 m), weight 190 lb (86.2 kg), SpO2 96 %.   General: No apparent distress alert and oriented x3 mood and affect normal, dressed appropriately.  HEENT: Pupils equal, extraocular movements intact  Respiratory: Patient's speak in full sentences and does not appear short of breath  Cardiovascular: No lower extremity edema, non tender, no erythema  Gait MSK:  Back low back exam does have some mild loss of lordosis.  Osteopathic findings  C2 flexed rotated and side bent right C6 flexed rotated and side bent left T3 extended rotated and side bent right inhaled rib T9 extended rotated and side bent left L1 flexed rotated and side bent right Sacrum right on right       Assessment and Plan:  Lumbar back pain Tightness noted.  Has not been quite as active as usual.  Discussed posture and ergonomics, does respond well to osteopathic manipulation.  Follow-up again in 6 to 8 weeks.    Nonallopathic problems  Decision today to treat with OMT was based on Physical Exam  After verbal consent patient was treated with HVLA, ME, FPR techniques in cervical, rib, thoracic, lumbar, and sacral  areas  Patient tolerated the procedure well with  improvement in symptoms  Patient given exercises, stretches and lifestyle modifications  See medications in patient instructions if given  Patient will follow up in 4-8 weeks    The above documentation has been reviewed and is accurate and complete [name], DO          Note: This dictation was prepared with Dragon dictation along with smaller phrase technology. Any transcriptional errors that result from this process are unintentional.

## 2022-06-10 ENCOUNTER — Encounter: Payer: Self-pay | Admitting: Family Medicine

## 2022-06-10 ENCOUNTER — Ambulatory Visit (INDEPENDENT_AMBULATORY_CARE_PROVIDER_SITE_OTHER): Payer: BC Managed Care – PPO | Admitting: Family Medicine

## 2022-06-10 VITALS — BP 132/90 | HR 65 | Ht 66.0 in | Wt 190.0 lb

## 2022-06-10 DIAGNOSIS — M9903 Segmental and somatic dysfunction of lumbar region: Secondary | ICD-10-CM | POA: Diagnosis not present

## 2022-06-10 DIAGNOSIS — M9908 Segmental and somatic dysfunction of rib cage: Secondary | ICD-10-CM

## 2022-06-10 DIAGNOSIS — M9904 Segmental and somatic dysfunction of sacral region: Secondary | ICD-10-CM | POA: Diagnosis not present

## 2022-06-10 DIAGNOSIS — M9901 Segmental and somatic dysfunction of cervical region: Secondary | ICD-10-CM

## 2022-06-10 DIAGNOSIS — M9902 Segmental and somatic dysfunction of thoracic region: Secondary | ICD-10-CM

## 2022-06-10 DIAGNOSIS — M545 Low back pain, unspecified: Secondary | ICD-10-CM

## 2022-06-10 NOTE — Patient Instructions (Signed)
Good to see you! Enjoy fancy gap Get back to routine See you again in 6-8 weeks

## 2022-06-10 NOTE — Assessment & Plan Note (Signed)
Tightness noted.  Has not been quite as active as usual.  Discussed posture and ergonomics, does respond well to osteopathic manipulation.  Follow-up again in 6 to 8 weeks.

## 2022-07-06 ENCOUNTER — Encounter (INDEPENDENT_AMBULATORY_CARE_PROVIDER_SITE_OTHER): Payer: Self-pay

## 2022-07-19 ENCOUNTER — Ambulatory Visit (INDEPENDENT_AMBULATORY_CARE_PROVIDER_SITE_OTHER): Payer: BC Managed Care – PPO

## 2022-07-19 DIAGNOSIS — Z23 Encounter for immunization: Secondary | ICD-10-CM

## 2022-07-19 NOTE — Progress Notes (Unsigned)
[facility] [place] Phone: [phone] Subjective:   [name], am serving as a scribe for Dr. Hulan Saas.  I'm seeing this patient by the request  of:  Vivi Barrack, MD  CC: Back and neck pain follow-up  [id number]  Trestan Vahle is a 66 y.o. male coming in with complaint of back and neck pain. OMT 06/10/2022. Patient states that he had some intermittent mid back pain and some stiffness in neck.   Medications patient has been prescribed: None  Taking:          Past Medical History:  Diagnosis Date   Allergy    Anemia    as a kid per patient   Anxiety    Arthritis    Back pain    Depression    Food allergy    Hyperlipidemia    Hypertension    "white coat syndrome" per patient   Lactose intolerance    Metabolic syndrome    per patient- seeing weight loss MD for this   Morton's neuroma    Obesity    Pneumonia    "walking pneumonia 16-17 yr ago"   Pre-diabetes    history of pre-DM- "out of zone" d/t weight loss   Sleep apnea    uses CPAP    Allergies  Allergen Reactions   Grass Pollen(K-O-R-T-Swt Vern) Other (See Comments)   Molds & Smuts Other (See Comments)   Pollen Extract    Thimerosal Itching    Eye redness      Review of Systems:  No headache, visual changes, nausea, vomiting, diarrhea, constipation, dizziness, abdominal pain, skin rash, fevers, chills, night sweats, weight loss, swollen lymph nodes, body aches, joint swelling, chest pain, shortness of breath, mood changes. POSITIVE muscle aches  Objective  Blood pressure 132/84, pulse 82, height '5\' 6"'$  (1.676 m), SpO2 97 %.   General: No apparent distress alert and oriented x3 mood and affect normal, dressed appropriately.  HEENT: Pupils equal, extraocular movements intact  Respiratory: Patient's speak in full sentences and does not appear short of breath  Cardiovascular: No lower extremity edema, non  tender, no erythema  Gait normal gait MSK:  Back patient back exam very mild loss of lordosis but improvement in core strength.  Patient has done well with some weight loss.  Still has tightness noted with FABER test bilaterally.  Worsening pain with extension.  Osteopathic findings  C2 flexed rotated and side bent right C7 flexed rotated and side bent left T3 extended rotated and side bent right inhaled rib L1 flexed rotated and side bent right Sacrum right on right       Assessment and Plan:  Lumbar back pain Low back exam shows improvement in the the core strength discussed with patient about icing regimen and home exercises otherwise.  Patient is going to continue to work on the core strengthening.  Follow-up with me again in 6 to 8 weeks    Nonallopathic problems  Decision today to treat with OMT was based on Physical Exam  After verbal consent patient was treated with HVLA, ME, FPR techniques in cervical, rib, thoracic, lumbar, and sacral  areas  Patient tolerated the procedure well with improvement in symptoms  Patient given exercises, stretches and lifestyle modifications  See medications in patient instructions if given  Patient will follow up in 4-8 weeks    The above documentation has been reviewed and is accurate and complete Olevia Bowens  Tamala Julian, DO          Note: This dictation was prepared with Dragon dictation along with smaller phrase technology. Any transcriptional errors that result from this process are unintentional.

## 2022-07-21 ENCOUNTER — Ambulatory Visit: Payer: BC Managed Care – PPO | Admitting: Family Medicine

## 2022-07-21 VITALS — BP 132/84 | HR 82 | Ht 66.0 in

## 2022-07-21 DIAGNOSIS — M545 Low back pain, unspecified: Secondary | ICD-10-CM

## 2022-07-21 DIAGNOSIS — M9903 Segmental and somatic dysfunction of lumbar region: Secondary | ICD-10-CM

## 2022-07-21 DIAGNOSIS — M9901 Segmental and somatic dysfunction of cervical region: Secondary | ICD-10-CM

## 2022-07-21 DIAGNOSIS — M9902 Segmental and somatic dysfunction of thoracic region: Secondary | ICD-10-CM

## 2022-07-21 DIAGNOSIS — M9904 Segmental and somatic dysfunction of sacral region: Secondary | ICD-10-CM | POA: Diagnosis not present

## 2022-07-21 DIAGNOSIS — M9908 Segmental and somatic dysfunction of rib cage: Secondary | ICD-10-CM | POA: Diagnosis not present

## 2022-07-21 NOTE — Assessment & Plan Note (Signed)
Low back exam shows improvement in the the core strength discussed with patient about icing regimen and home exercises otherwise.  Patient is going to continue to work on the core strengthening.  Follow-up with me again in 6 to 8 weeks

## 2022-07-21 NOTE — Patient Instructions (Signed)
Keep mouse close See me in 6-8 weeks

## 2022-08-22 ENCOUNTER — Encounter: Payer: Self-pay | Admitting: *Deleted

## 2022-08-31 NOTE — Progress Notes (Unsigned)
[place] Phone: [phone] Subjective:   Lucas Lucas [name], am serving as a scribe for Dr. Hulan Saas.  I'm seeing this patient by the request  of:  Vivi Barrack, MD  CC: Back and neck pain follow-up  [id number]  Lucas Lucas [name] is a 66 y.o. male coming in with complaint of back and neck pain. OMT 07/21/2022. Patient states that his neck is still. Lumbar spine pain is better.  Mild discomfort on a regular basis.  Patient is continuing to try to lose weight.  Is sleeping much better since he has had the inspire  Medications patient has been prescribed: None  Taking:         Reviewed prior external information including notes and imaging from previsou exam, outside providers and external EMR if available.   As well as notes that were available from care everywhere and other healthcare systems.  Past medical history, social, surgical and family history all reviewed in electronic medical record.  Lucas [name] pertanent information unless stated regarding to the chief complaint.   Past Medical History:  Diagnosis Date   Allergy    Anemia    as a kid per patient   Anxiety    Arthritis    Back pain    Depression    Food allergy    Hyperlipidemia    Hypertension    "white coat syndrome" per patient   Lactose intolerance    Metabolic syndrome    per patient- seeing weight loss MD for this   Morton's neuroma    Obesity    Pneumonia    "walking pneumonia 16-17 yr ago"   Pre-diabetes    history of pre-DM- "out of zone" d/t weight loss   Sleep apnea    uses CPAP    Allergies  Allergen Reactions   Grass Pollen(K-O-R-T-Swt Vern) Other (See Comments)   Molds & Smuts Other (See Comments)   Pollen Extract    Thimerosal (Thiomersal) Itching    Eye redness      Review of Systems:  Lucas [name] headache, visual changes, nausea, vomiting, diarrhea, constipation, dizziness, abdominal pain, skin rash,  fevers, chills, night sweats, weight loss, swollen lymph nodes, body aches, joint swelling, chest pain, shortness of breath, mood changes. POSITIVE muscle aches  Objective  Blood pressure 132/88, pulse (!) 55, height '5\' 6"'$  (1.676 m), weight 189 lb (85.7 kg), SpO2 98 %.   General: Lucas [name] apparent distress alert and oriented x3 mood and affect normal, dressed appropriately.  HEENT: Pupils equal, extraocular movements intact  Respiratory: Patient's speak in full sentences and does not appear short of breath  Cardiovascular: Lucas [name] lower extremity edema, non tender, Lucas [name] erythema  Gait MSK:  Back still mild loss of lordosis.  Improvement in core strength noted.  Still has some hip abductor weakness.  Tightness and FABER test bilaterally.  Mild increase in tightness in the thoracolumbar juncture.  Osteopathic findings  C2 flexed rotated and side bent right C7 flexed rotated and side bent left T3 extended rotated and side bent right inhaled rib T8 extended rotated and side bent left L2 flexed rotated and side bent right Sacrum right on right       Assessment and Plan:  Degenerative disc disease, cervical Neck exam does have some mild loss of lordosis.  Has done well though with conservative therapy at this point.  There is multiple other things that could be more helpful including different medications but I  do think patient will continue to do well at the moment.  Discussed icing regimen and home exercises.  Follow-up again in 6 to 8 weeks.    Nonallopathic problems  Decision today to treat with OMT was based on Physical Exam  After verbal consent patient was treated with HVLA, ME, FPR techniques in cervical, rib, thoracic, lumbar, and sacral  areas  Patient tolerated the procedure well with improvement in symptoms  Patient given exercises, stretches and lifestyle modifications  See medications in patient instructions if given  Patient will follow up in 4-8 weeks     The above  documentation has been reviewed and is accurate and complete Lucas Pulley, DO         Note: This dictation was prepared with Dragon dictation along with smaller phrase technology. Any transcriptional errors that result from this process are unintentional.

## 2022-09-01 ENCOUNTER — Ambulatory Visit (INDEPENDENT_AMBULATORY_CARE_PROVIDER_SITE_OTHER): Payer: BC Managed Care – PPO | Admitting: Family Medicine

## 2022-09-01 ENCOUNTER — Encounter: Payer: Self-pay | Admitting: Family Medicine

## 2022-09-01 VITALS — BP 132/88 | HR 55 | Ht 66.0 in | Wt 189.0 lb

## 2022-09-01 DIAGNOSIS — M9903 Segmental and somatic dysfunction of lumbar region: Secondary | ICD-10-CM

## 2022-09-01 DIAGNOSIS — M9908 Segmental and somatic dysfunction of rib cage: Secondary | ICD-10-CM

## 2022-09-01 DIAGNOSIS — M9901 Segmental and somatic dysfunction of cervical region: Secondary | ICD-10-CM | POA: Diagnosis not present

## 2022-09-01 DIAGNOSIS — M9902 Segmental and somatic dysfunction of thoracic region: Secondary | ICD-10-CM

## 2022-09-01 DIAGNOSIS — M5030 Other cervical disc degeneration, unspecified cervical region: Secondary | ICD-10-CM | POA: Diagnosis not present

## 2022-09-01 DIAGNOSIS — M9904 Segmental and somatic dysfunction of sacral region: Secondary | ICD-10-CM

## 2022-09-01 NOTE — Assessment & Plan Note (Signed)
Neck exam does have some mild loss of lordosis.  Has done well though with conservative therapy at this point.  There is multiple other things that could be more helpful including different medications but I do think patient will continue to do well at the moment.  Discussed icing regimen and home exercises.  Follow-up again in 6 to 8 weeks.

## 2022-09-01 NOTE — Patient Instructions (Signed)
Good to see you Overall doing great Don't listen to Dr. Juleen China who calls you sideways Send a pic if you can See me in 6-7 weeks

## 2022-09-08 IMAGING — DX DG NECK SOFT TISSUE
1 series · 1 of 1 positions shown · non-contrast
Comparison: 12/23/2020

CLINICAL DATA: Hypoglossal nerve stimulator placement

EXAM:
NECK SOFT TISSUES - 1+ VIEW

[neck lat]
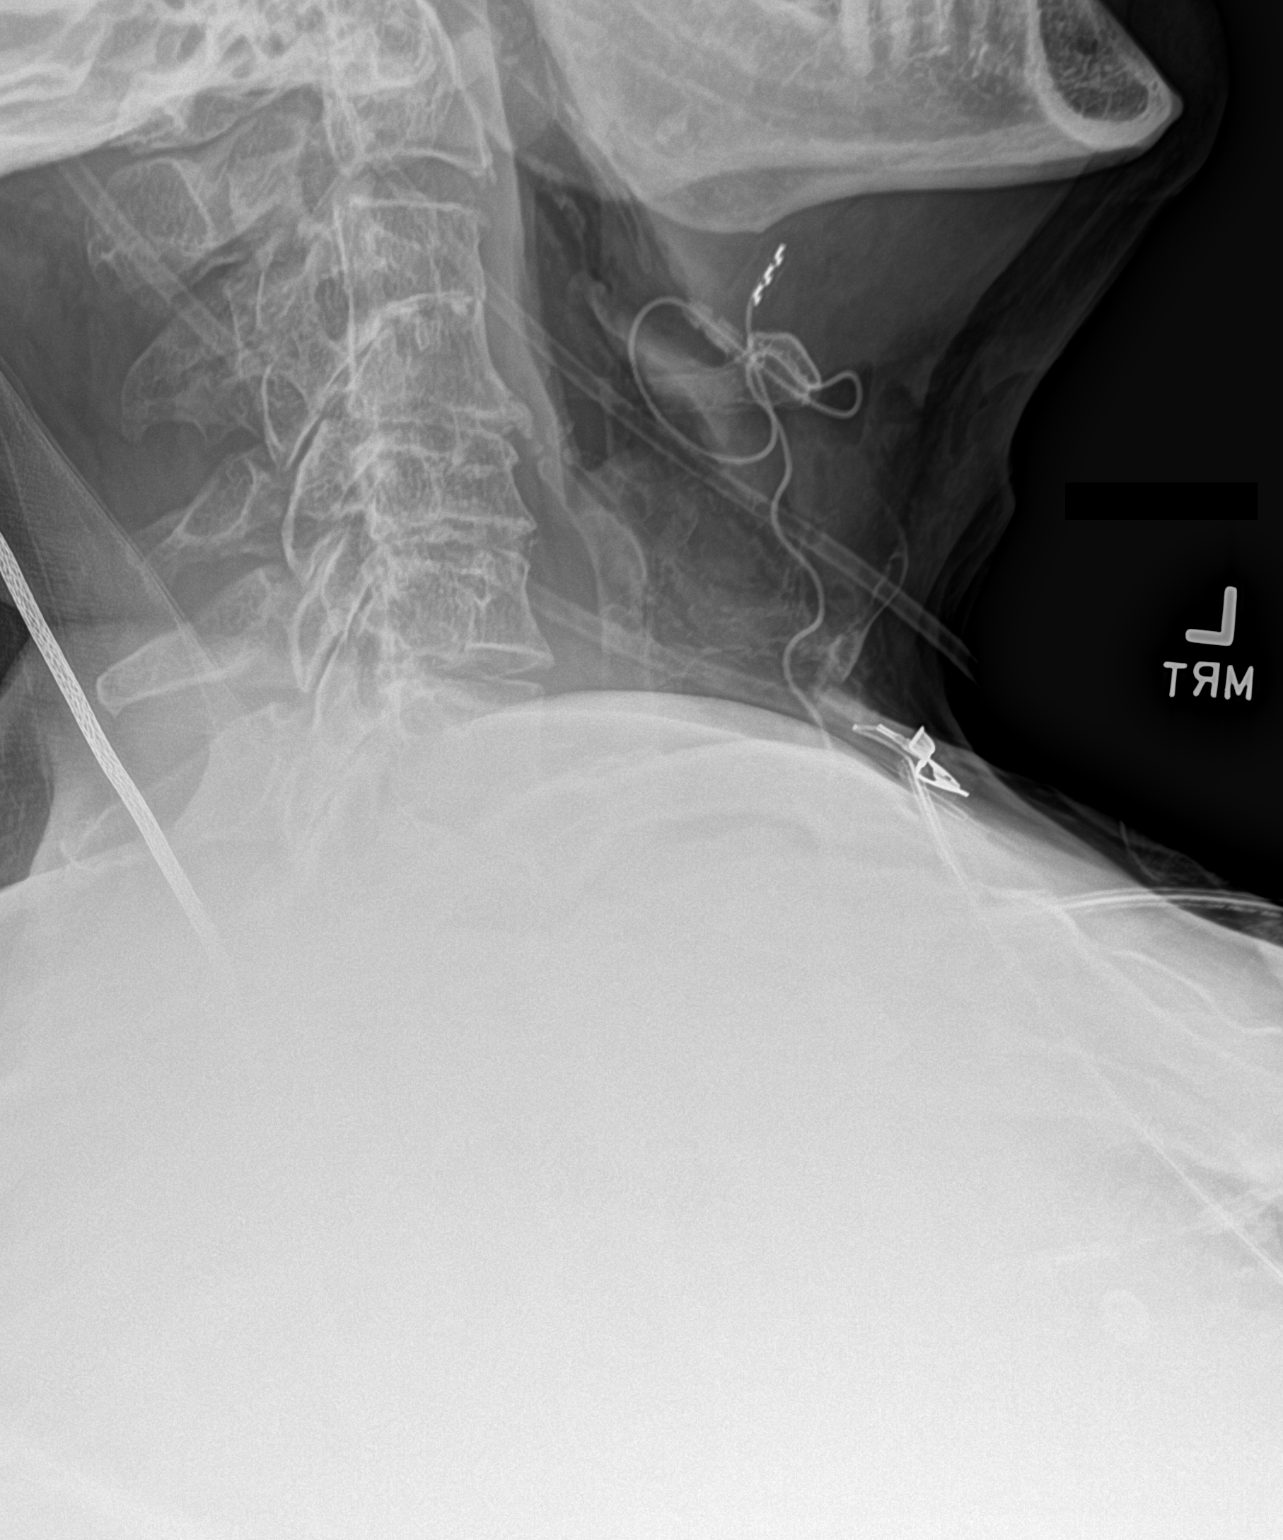

[1 of 1 positions shown; findings below may reference images not displayed]

FINDINGS: Cross-table lateral view of the soft tissues of the neck are
obtained. Nerve stimulator lead is identified within the anterior
soft tissues, terminating in the submandibular region. Airways
patent. Stable spondylosis of the cervical spine, with bony fusion
across C3-4 as before.
IMPRESSION: 1. There stimulator lead within the anterior soft tissues,
terminating in the submandibular region.

## 2022-10-14 NOTE — Progress Notes (Unsigned)
[place] Phone: [phone] Subjective:   [name], am serving as a scribe for Dr. Hulan Saas.  I'm seeing this patient by the request  of:  Vivi Barrack, MD  CC: Neck pain and back pain follow-up  [id number]  Jasaun Carn is a 66 y.o. male coming in with complaint of back and neck pain. OMT 09/01/2022. Patient states that his neck is stiff but is doing well otherwise.  Patient continues to try to be active.  Noticing some more neck pain when he does certain types of activity or the aftermath.  Especially with such things as raking leaves.  Medications patient has been prescribed: None  Taking:         Reviewed prior external information including notes and imaging from previsou exam, outside providers and external EMR if available.   As well as notes that were available from care everywhere and other healthcare systems.  Past medical history, social, surgical and family history all reviewed in electronic medical record.  No pertanent information unless stated regarding to the chief complaint.   Past Medical History:  Diagnosis Date   Allergy    Anemia    as a kid per patient   Anxiety    Arthritis    Back pain    Depression    Food allergy    Hyperlipidemia    Hypertension    "white coat syndrome" per patient   Lactose intolerance    Metabolic syndrome    per patient- seeing weight loss MD for this   Morton's neuroma    Obesity    Pneumonia    "walking pneumonia 16-17 yr ago"   Pre-diabetes    history of pre-DM- "out of zone" d/t weight loss   Sleep apnea    uses CPAP    Allergies  Allergen Reactions   Grass Pollen(K-O-R-T-Swt Vern) Other (See Comments)   Molds & Smuts Other (See Comments)   Pollen Extract    Thimerosal (Thiomersal) Itching    Eye redness      Review of Systems:  No headache, visual changes, nausea, vomiting, diarrhea, constipation,  dizziness, abdominal pain, skin rash, fevers, chills, night sweats, weight loss, swollen lymph nodes, body aches, joint swelling, chest pain, shortness of breath, mood changes. POSITIVE muscle aches  Objective  Blood pressure (!) 138/100, pulse 60, height '5\' 6"'$  (1.676 m), weight 188 lb (85.3 kg), SpO2 96 %.   General: No apparent distress alert and oriented x3 mood and affect normal, dressed appropriately.  HEENT: Pupils equal, extraocular movements intact  Respiratory: Patient's speak in full sentences and does not appear short of breath  Cardiovascular: No lower extremity edema, non tender, no erythema  Neck exam does have some loss of lordosis.  Some tenderness to palpation in the paraspinal musculature patient does have some very mild crepitus noted as well.  Osteopathic findings  C2 flexed rotated and side bent right C4 flexed rotated and side bent left T3 extended rotated and side bent right inhaled rib L1 flexed rotated and side bent right Sacrum right on right       Assessment and Plan:  Degenerative disc disease, cervical Continue tightness of the neck noted.  Discussed icing regimen and home exercises, discussed which activities to do and which ones to avoid.  Will continue to work on posture.  Patient is continuing to attempt to lose weight and is slowly progressing.  We discussed decreasing  some of the range of motion.  Follow-up again 6 to 8 weeks    Nonallopathic problems  Decision today to treat with OMT was based on Physical Exam  After verbal consent patient was treated with HVLA, ME, FPR techniques in cervical, rib, thoracic, lumbar, and sacral  areas  Patient tolerated the procedure well with improvement in symptoms  Patient given exercises, stretches and lifestyle modifications  See medications in patient instructions if given  Patient will follow up in 4-8 weeks     The above documentation has been reviewed and is accurate and complete [name], DO         Note: This dictation was prepared with Dragon dictation along with smaller phrase technology. Any transcriptional errors that result from this process are unintentional.

## 2022-10-18 ENCOUNTER — Ambulatory Visit (INDEPENDENT_AMBULATORY_CARE_PROVIDER_SITE_OTHER): Payer: BC Managed Care – PPO | Admitting: Family Medicine

## 2022-10-18 VITALS — BP 138/100 | HR 60 | Ht 66.0 in | Wt 188.0 lb

## 2022-10-18 DIAGNOSIS — M9901 Segmental and somatic dysfunction of cervical region: Secondary | ICD-10-CM

## 2022-10-18 DIAGNOSIS — M9903 Segmental and somatic dysfunction of lumbar region: Secondary | ICD-10-CM

## 2022-10-18 DIAGNOSIS — M5030 Other cervical disc degeneration, unspecified cervical region: Secondary | ICD-10-CM | POA: Diagnosis not present

## 2022-10-18 DIAGNOSIS — M9908 Segmental and somatic dysfunction of rib cage: Secondary | ICD-10-CM

## 2022-10-18 DIAGNOSIS — M9904 Segmental and somatic dysfunction of sacral region: Secondary | ICD-10-CM

## 2022-10-18 DIAGNOSIS — M9902 Segmental and somatic dysfunction of thoracic region: Secondary | ICD-10-CM

## 2022-10-18 NOTE — Assessment & Plan Note (Signed)
Continue tightness of the neck noted.  Discussed icing regimen and home exercises, discussed which activities to do and which ones to avoid.  Will continue to work on posture.  Patient is continuing to attempt to lose weight and is slowly progressing.  We discussed decreasing some of the range of motion.  Follow-up again 6 to 8 weeks

## 2022-10-18 NOTE — Patient Instructions (Addendum)
Great to see you as always  7 week follow up

## 2022-11-07 ENCOUNTER — Ambulatory Visit (INDEPENDENT_AMBULATORY_CARE_PROVIDER_SITE_OTHER): Payer: BC Managed Care – PPO | Admitting: Internal Medicine

## 2022-11-07 ENCOUNTER — Encounter: Payer: Self-pay | Admitting: Internal Medicine

## 2022-11-07 VITALS — BP 130/80 | HR 70 | Ht 66.0 in | Wt 193.0 lb

## 2022-11-07 DIAGNOSIS — D352 Benign neoplasm of pituitary gland: Secondary | ICD-10-CM

## 2022-11-07 DIAGNOSIS — R7989 Other specified abnormal findings of blood chemistry: Secondary | ICD-10-CM

## 2022-11-07 NOTE — Progress Notes (Signed)
Name: [name]  MRN/ DOB: [id number], August 15, 1956    Age/ Sex: 66 y.o., male     PCP: Vivi Barrack, MD   Reason for Endocrinology Evaluation: Hyperprolactinemia/hypogonadism     Initial Endocrinology Clinic Visit: 04/19/2021    PATIENT IDENTIFIER: [name] is a 66 y.o., male with a past medical history of prolactinoma. He has followed with [facility] since 04/19/2021 for consultative assistance with management of his hyperprolactinemia.   HISTORICAL SUMMARY: The patient was first diagnosed with hyperprolactinemia in May 2022 with a prolactin level of 117.5 NG/mL.  He was started on cabergoline at the time   Of note the patient has also been noted with low testosterone at the time with a nadir of 101 NG/dL in 03/2021 but this has normalized    Patient has 2 biological children  Pituitary MRI did not show any pituitary adenoma 04/2021  SUBJECTIVE:    Today (11/07/2022):  Lucas Young is here for a follow-up on hyperprolactinemia and hypogonadism.   Denies headaches  Denies vision changes  Denies cannabis or opiate use  Has GI symptoms that he attributes to [facility]  Denies erectile  dysfunction  Denies galactorrhea  Denies nausea   Cabergoline 0.5 mg 2 tabs twice weekly       HISTORY:  Past Medical History:  Past Medical History:  Diagnosis Date   Allergy    Anemia    as a kid per patient   Anxiety    Arthritis    Back pain    Depression    Food allergy    Hyperlipidemia    Hypertension    "white coat syndrome" per patient   Lactose intolerance    Metabolic syndrome    per patient- seeing weight loss MD for this   Morton's neuroma    Obesity    Pneumonia    "walking pneumonia 16-17 yr ago"   Pre-diabetes    history of pre-DM- "out of zone" d/t weight loss   Sleep apnea    uses CPAP   Past Surgical History:  Past Surgical History:  Procedure Laterality Date   APPENDECTOMY     COLONOSCOPY  2010    Dr.Medoff  normal per pt   DRUG INDUCED ENDOSCOPY N/A 02/01/2022   Procedure: DRUG INDUCED SPLEEP ENDOSCOPY;  Surgeon: Melida Quitter, MD;  Location: [facility];  Service: ENT;  Laterality: N/A;   HEMATOMA EVACUATION Right 04/28/2022   Procedure: EVACUATION HEMATOMA OF NECK;  Surgeon: Melida Quitter, MD;  Location: [facility];  Service: ENT;  Laterality: Right;   IMPLANTATION OF HYPOGLOSSAL NERVE STIMULATOR Right 04/26/2022   Procedure: IMPLANTATION OF HYPOGLOSSAL NERVE STIMULATOR;  Surgeon: Melida Quitter, MD;  Location: [facility];  Service: ENT;  Laterality: Right;   UMBILICAL HERNIA REPAIR     Social History:  reports that he has never smoked. He has never used smokeless tobacco. He reports that he does not drink alcohol and does not use drugs. Family History:  Family History  Problem Relation Age of Onset   Hypertension Mother    Sleep apnea Mother    Obesity Mother    Colon polyps Father    Hypertension Father    Hyperlipidemia Father    Heart disease Father    Sleep apnea Father    Obesity Father    Hypertension Maternal Grandmother    Hypertension Maternal Grandfather    Other Neg Hx        low testosterone  Colon cancer Neg Hx    Esophageal cancer Neg Hx    Stomach cancer Neg Hx    Rectal cancer Neg Hx      HOME MEDICATIONS: Allergies as of 11/07/2022       Reactions   Grass Pollen(k-o-r-t-swt Vern) Other (See Comments)   Molds & Smuts Other (See Comments)   Pollen Extract    Thimerosal (thiomersal) Itching   Eye redness         Medication List        Accurate as of November 07, 2022 11:33 AM. If you have any questions, ask your nurse or doctor.          STOP taking these medications    ALAWAY OP Stopped by: Dorita Sciara, MD   HYDROcodone-acetaminophen 5-325 MG tablet Commonly known as: NORCO/VICODIN Stopped by: Dorita Sciara, MD   Mounjaro 12.5 MG/0.5ML Pen Generic drug: tirzepatide Stopped by: Dorita Sciara, MD    tirzepatide 15 MG/0.5ML Pen Commonly known as: MOUNJARO Stopped by: Dorita Sciara, MD       TAKE these medications    aspirin EC 325 MG tablet Take 650 mg by mouth daily as needed (headaches).   cabergoline 0.5 MG tablet Commonly known as: DOSTINEX Take 2 tablets (1 mg total) by mouth 2 (two) times a week.   calcium carbonate 500 MG chewable tablet Commonly known as: TUMS - dosed in mg elemental calcium Chew 2,000 mg by mouth daily as needed for indigestion or heartburn.   desloratadine 5 MG tablet Commonly known as: CLARINEX Take 5 mg by mouth daily as needed (allergies).   EPINEPHrine 0.3 mg/0.3 mL Soaj injection Commonly known as: EPI-PEN Inject 0.3 mg into the muscle as needed for anaphylaxis.   fluticasone 50 MCG/ACT nasal spray Commonly known as: FLONASE Place 1 spray into both nostrils daily as needed for allergies.   ibuprofen 200 MG tablet Commonly known as: ADVIL Take 400 mg by mouth every 8 (eight) hours as needed for moderate pain.   montelukast 10 MG tablet Commonly known as: SINGULAIR Take 10 mg by mouth daily as needed (allergies).   multivitamin capsule Take 1 capsule by mouth daily.   SYSTANE OP Place 1 drop into both eyes daily as needed (dry eyes).   VITAMIN C PO Take 1 tablet by mouth daily.   VITAMIN D PO Take 1 capsule by mouth daily.   VITAMIN K PO Take 1 capsule by mouth daily.   Wegovy 1.7 MG/0.75ML Soaj Generic drug: Semaglutide-Weight Management Inject 1.7 mg into the skin once a week.   Wegovy 2.4 MG/0.75ML Soaj Generic drug: Semaglutide-Weight Management Inject 2.4 mg into the skin once a week.          OBJECTIVE:   PHYSICAL EXAM: VS: BP 130/80 (BP Location: Left Arm, Patient Position: Sitting, Cuff Size: Small)   Pulse 70   Ht '5\' 6"'$  (1.676 m)   Wt 193 lb (87.5 kg)   SpO2 96%   BMI 31.15 kg/m    EXAM: General: Pt appears well and is in NAD  Lungs: Clear with good BS bilat with no rales, rhonchi, or  wheezes  Heart: Auscultation: RRR.  Abdomen: Normoactive bowel sounds, soft, nontender, without masses or organomegaly palpable  Extremities:  BL LE: No pretibial edema normal ROM and strength.  Mental Status: Judgment, insight: Intact Orientation: Oriented to time, place, and person Mood and affect: No depression, anxiety, or agitation     DATA REVIEWED:  Latest Reference Range &  Units 04/04/22 09:51  Sodium 135 - 145 mEq/L 138  Potassium 3.5 - 5.1 mEq/L 4.3  Chloride 96 - 112 mEq/L 103  CO2 19 - 32 mEq/L 28  Glucose 70 - 99 mg/dL 88  BUN 6 - 23 mg/dL 18  Creatinine 0.40 - 1.50 mg/dL 0.91  Calcium 8.4 - 10.5 mg/dL 9.3  Alkaline Phosphatase 39 - 117 U/L 54  Albumin 3.5 - 5.2 g/dL 4.3  AST 0 - 37 U/L 24  ALT 0 - 53 U/L 22  Total Protein 6.0 - 8.3 g/dL 6.7  Total Bilirubin 0.2 - 1.2 mg/dL 0.8  GFR >60.00 mL/min 89.01    MRI brain 05/16/2021   Dedicated pituitary protocol was performed. 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 The pituitary parenchyma enhances relatively homogeneously on dynamic protocol without convincing discrete pituitary lesion. The infundibulum is midline and unremarkable. No suprasellar extension of pituitary parenchyma. Unremarkable optic chiasm.    ASSESSMENT / PLAN / RECOMMENDATIONS:   Prolactinoma   -He has responded well to cabergoline -MRI in 2022 did not reveal any pituitary adenoma -Patient will return for prolactin check and will consider reducing cabergoline if appropriate   Medications   Cabergoline 0.5 mg, 2 tabs twice weekly   2.  Low testosterone:   -This has normalized with improving prolactin level -Patient asymptomatic -He would like testosterone rechecked as historically despite having a normal total testosterone his free testosterone is low, we discussed that free testosterone assays have not been validated  -He will return for fasting 8 AM labs    Follow-up in 6  months Signed electronically by: Mack Guise, MD  [facility]  [facility] [address] [place], [place] [address] Phone: [phone] FAX: [phone]      CC: Vivi Barrack, [place] [address] Phone: [phone]  Fax: [phone]   Return to Endocrinology clinic as below: Future Appointments  Date Time Provider [place]  12/06/2022  7:45 AM [name], DO [facility] None  04/06/2023  9:00 AM Vivi Barrack, MD [facility]

## 2022-11-10 ENCOUNTER — Encounter: Payer: Self-pay | Admitting: *Deleted

## 2022-11-10 ENCOUNTER — Other Ambulatory Visit (INDEPENDENT_AMBULATORY_CARE_PROVIDER_SITE_OTHER): Payer: BC Managed Care – PPO

## 2022-11-10 DIAGNOSIS — D352 Benign neoplasm of pituitary gland: Secondary | ICD-10-CM | POA: Diagnosis not present

## 2022-11-14 LAB — TESTOSTERONE, TOTAL, LC/MS/MS: Testosterone, Total, LC-MS-MS: 540 ng/dL (ref 250–1100)

## 2022-11-14 LAB — PROLACTIN: Prolactin: 5.3 ng/mL (ref 2.0–18.0)

## 2022-11-29 NOTE — Progress Notes (Signed)
[place] Phone: [phone] Subjective:   [name], am serving as a scribe for Dr. Hulan Saas.  I'm seeing this patient by the request  of:  Vivi Barrack, MD  CC: back and neck pain follow up   [id number]  Lucas Young is a 67 y.o. male coming in with complaint of back and neck pain. OMT 10/18/2022. Patient states that his neck is stiff and has a spot in thoracic spine that is point tender.   Medications patient has been prescribed: None  Taking:         Reviewed prior external information including notes and imaging from previsou exam, outside providers and external EMR if available.   As well as notes that were available from care everywhere and other healthcare systems.  Recently was seen by endocrinology or patient is doing very well with testosterone being in normal range and prolactin levels back to normal.  Past medical history, social, surgical and family history all reviewed in electronic medical record.  No pertanent information unless stated regarding to the chief complaint.   Past Medical History:  Diagnosis Date   Allergy    Anemia    as a kid per patient   Anxiety    Arthritis    Back pain    Depression    Food allergy    Hyperlipidemia    Hypertension    "white coat syndrome" per patient   Lactose intolerance    Metabolic syndrome    per patient- seeing weight loss MD for this   Morton's neuroma    Obesity    Pneumonia    "walking pneumonia 16-17 yr ago"   Pre-diabetes    history of pre-DM- "out of zone" d/t weight loss   Sleep apnea    uses CPAP    Allergies  Allergen Reactions   Grass Pollen(K-O-R-T-Swt Vern) Other (See Comments)   Molds & Smuts Other (See Comments)   Pollen Extract    Thimerosal (Thiomersal) Itching    Eye redness      Review of Systems:  No headache, visual changes, nausea, vomiting, diarrhea, constipation,  dizziness, abdominal pain, skin rash, fevers, chills, night sweats, weight loss, swollen lymph nodes, body aches, joint swelling, chest pain, shortness of breath, mood changes. POSITIVE muscle aches  Objective  Blood pressure (!) 150/102, pulse 67, height '5\' 6"'$  (1.676 m), SpO2 96 %.   General: No apparent distress alert and oriented x3 mood and affect normal, dressed appropriately.  HEENT: Pupils equal, extraocular movements intact  Respiratory: Patient's speak in full sentences and does not appear short of breath  Cardiovascular: No lower extremity edema, non tender, no erythema   Osteopathic findings  C2 flexed rotated and side bent right C4 flexed rotated and side bent left C6 flexed rotated and side bent left T3 extended rotated and side bent left inhaled rib T9 extended rotated and side bent left L2 flexed rotated and side bent right Sacrum right on right     Assessment and Plan:  Degenerative disc disease, cervical Degenerative disc disease. Increase activity  Tightness noted  .  Still not taking medications on a regular basis.  Follow-up with me again in 6 to 8 weeks    Nonallopathic problems  Decision today to treat with OMT was based on Physical Exam  After verbal consent patient was treated with HVLA, ME, FPR techniques in cervical, rib, thoracic, lumbar, and sacral  areas  Patient tolerated the procedure well with improvement in symptoms  Patient given exercises, stretches and lifestyle modifications  See medications in patient instructions if given  Patient will follow up in 4-8 weeks    The above documentation has been reviewed and is accurate and complete [name], DO          Note: This dictation was prepared with Dragon dictation along with smaller phrase technology. Any transcriptional errors that result from this process are unintentional.

## 2022-12-06 ENCOUNTER — Ambulatory Visit: Payer: BC Managed Care – PPO | Admitting: Family Medicine

## 2022-12-06 VITALS — BP 150/102 | HR 67 | Ht 66.0 in

## 2022-12-06 DIAGNOSIS — M9904 Segmental and somatic dysfunction of sacral region: Secondary | ICD-10-CM | POA: Diagnosis not present

## 2022-12-06 DIAGNOSIS — M9902 Segmental and somatic dysfunction of thoracic region: Secondary | ICD-10-CM

## 2022-12-06 DIAGNOSIS — M5030 Other cervical disc degeneration, unspecified cervical region: Secondary | ICD-10-CM | POA: Diagnosis not present

## 2022-12-06 DIAGNOSIS — M9908 Segmental and somatic dysfunction of rib cage: Secondary | ICD-10-CM

## 2022-12-06 DIAGNOSIS — M9903 Segmental and somatic dysfunction of lumbar region: Secondary | ICD-10-CM | POA: Diagnosis not present

## 2022-12-06 DIAGNOSIS — M9901 Segmental and somatic dysfunction of cervical region: Secondary | ICD-10-CM | POA: Diagnosis not present

## 2022-12-06 NOTE — Assessment & Plan Note (Addendum)
Degenerative disc disease. Increase activity  Tightness noted  .  Still not taking medications on a regular basis.  Follow-up with me again in 6 to 8 weeks

## 2022-12-06 NOTE — Patient Instructions (Signed)
Good to see you  Carefull in th tsunami See me in 6-7 weeks

## 2022-12-20 ENCOUNTER — Encounter: Payer: Self-pay | Admitting: Family Medicine

## 2022-12-20 ENCOUNTER — Telehealth: Payer: Self-pay | Admitting: Family Medicine

## 2022-12-20 NOTE — Telephone Encounter (Signed)
Recommend he schedule appointment ASAP.  Algis Greenhouse. Jerline Pain, MD 12/20/2022 8:28 AM

## 2022-12-20 NOTE — Telephone Encounter (Signed)
Vml for pt to call back and be seen today   Patient Name: Lucas Young Gender: Male DOB: 03/31/1956 Age: 67 Y 3 M 21 D Return Phone Number: [phone] (Primary) Address: City/ State/ Zip: [place]  [address] Client [facility] Client Site [facility] Night Provider Dimas Chyle- MD Contact Type Call Who Is Calling Patient / Member / Family / Caregiver Call Type Triage / Clinical Relationship To Patient Self Return Phone Number [phone] (Primary) Chief Complaint Facial Swelling Reason for Call Symptomatic / Request for [place] states they have eye redness, swelling, and discharge. Translation No Nurse Assessment Nurse: Rosana Hoes, RN, Luvenia Starch Date/Time (Eastern Time): 12/19/2022 5:07:55 PM Confirm and document reason for call. If symptomatic, describe symptoms. ---Caller states he has eye redness, swelling, discharge, onset this morning. Afebrile. Does the patient have any new or worsening symptoms? ---Yes Will a triage be completed? ---Yes Related visit to physician within the last 2 weeks? ---No Does the PT have any chronic conditions? (i.e. diabetes, asthma, this includes High risk factors for pregnancy, etc.) ---No Is this a behavioral health or substance abuse call? ---No Guidelines Guideline Title Affirmed Question Affirmed Notes Nurse Date/Time (Eastern Time) Eye - Pus or Discharge Eyelid is red and painful (or tender to touch) Rosana Hoes, RN, Luvenia Starch 12/19/2022 5:10:22 PM Disp. Time Eilene Ghazi Time) Disposition Final User 12/19/2022 5:15:41 PM See PCP within 24 Hours Yes Rosana Hoes RN, Luvenia Starch Final Disposition 12/19/2022 5:15:41 PM See PCP within 24 Hours Yes Rosana Hoes, RN, Luvenia Starch PLEASE NOTE: All timestamps contained within this report are represented as [place] Standard Time. CONFIDENTIALTY NOTICE: This fax transmission is intended only for the addressee. It  contains information that is legally privileged, confidential or otherwise protected from use or disclosure. If you are not the intended recipient, you are strictly prohibited from reviewing, disclosing, copying using or disseminating any of this information or taking any action in reliance on or regarding this information. If you have received this fax in error, please notify us immediately by telephone so that we can arrange for its return to [place]. Phone: [phone], Toll-Free: [phone], Fax: [phone] Page: 2 of 2 Call Id: [id number] [place] Disagree/Comply Comply Caller Understands Yes PreDisposition Did not know what to do Care Advice Given Per Guideline SEE PCP WITHIN 24 HOURS: * IF OFFICE WILL BE OPEN: You need to be examined within the next 24 hours. Call your doctor (or NP/PA) when the office opens and make an appointment. CALL BACK IF: * Blurred vision occurs * Light bothers your eyes * Fever occurs * You become worse CARE ADVICE given per Eye - Pus or Discharge (Adult) guideline. EYELID CLEANSING: * Gently wash eyelids and lashes with warm water and wet cotton balls (or cotton gauze). Remove all the dried and liquid pus. * Do this whenever pus is seen on the eyelids. Continue to do this until your appointment. Comments User: Cherene Altes, RN Date/Time Eilene Ghazi Time): 12/19/2022 5:16:26 PM Caller is requesting an appointment. Referrals REFERRED TO PCP OFFICE

## 2022-12-20 NOTE — Telephone Encounter (Signed)
Please see triage note and advise

## 2022-12-20 NOTE — Telephone Encounter (Signed)
Called pt to advise provider recommendations and lvm with cb number to return call and schedule appt per Dr Jerline Pain request.

## 2022-12-20 NOTE — Telephone Encounter (Signed)
Please schedule OV with PCP

## 2022-12-21 NOTE — Telephone Encounter (Signed)
Noted  

## 2022-12-21 NOTE — Telephone Encounter (Signed)
Notice, no appt schedule at this time

## 2023-01-03 ENCOUNTER — Encounter: Payer: Self-pay | Admitting: Family Medicine

## 2023-01-03 ENCOUNTER — Ambulatory Visit: Payer: Self-pay

## 2023-01-03 ENCOUNTER — Ambulatory Visit (INDEPENDENT_AMBULATORY_CARE_PROVIDER_SITE_OTHER): Payer: BC Managed Care – PPO

## 2023-01-03 ENCOUNTER — Ambulatory Visit: Payer: BC Managed Care – PPO | Admitting: Family Medicine

## 2023-01-03 VITALS — BP 122/84 | HR 78 | Ht 66.0 in

## 2023-01-03 DIAGNOSIS — M9904 Segmental and somatic dysfunction of sacral region: Secondary | ICD-10-CM | POA: Diagnosis not present

## 2023-01-03 DIAGNOSIS — M9908 Segmental and somatic dysfunction of rib cage: Secondary | ICD-10-CM | POA: Diagnosis not present

## 2023-01-03 DIAGNOSIS — M25561 Pain in right knee: Secondary | ICD-10-CM

## 2023-01-03 DIAGNOSIS — M9903 Segmental and somatic dysfunction of lumbar region: Secondary | ICD-10-CM

## 2023-01-03 DIAGNOSIS — M533 Sacrococcygeal disorders, not elsewhere classified: Secondary | ICD-10-CM

## 2023-01-03 DIAGNOSIS — M9901 Segmental and somatic dysfunction of cervical region: Secondary | ICD-10-CM

## 2023-01-03 DIAGNOSIS — M9902 Segmental and somatic dysfunction of thoracic region: Secondary | ICD-10-CM

## 2023-01-03 DIAGNOSIS — M25461 Effusion, right knee: Secondary | ICD-10-CM | POA: Insufficient documentation

## 2023-01-03 MED ORDER — MELOXICAM 15 MG PO TABS: 15.0000 mg | ORAL_TABLET | Freq: Every day | ORAL | 0 refills | Status: DC

## 2023-01-03 NOTE — Assessment & Plan Note (Signed)
Chronic problem with worsening symptoms secondary to compensation for patient's effusion of the right knee.  Discussed with patient about icing regimen and home exercises.  Discussed core strengthening still.  Increase activity slowly.  Follow-up again in 8 to 10 weeks.

## 2023-01-03 NOTE — Progress Notes (Signed)
[place] Phone: [phone] Subjective:    I'm seeing this patient by the request  of:  Vivi Barrack, MD  CC: Back pain, knee pain  [id number]  Male Minish is a 67 y.o. male coming in with complaint of back and neck pain. OMT 12/06/2022. Patient states that last week he tripped over his own foot and fell to his knees. Went to sit down on the couch when he got home and felt knee give away.   Unable to squat without pain. Pain in both anterior and posterior aspects.   Medications patient has been prescribed:   Taking:         Reviewed prior external information including notes and imaging from previsou exam, outside providers and external EMR if available.   As well as notes that were available from care everywhere and other healthcare systems.  Past medical history, social, surgical and family history all reviewed in electronic medical record.  No pertanent information unless stated regarding to the chief complaint.   Past Medical History:  Diagnosis Date   Allergy    Anemia    as a kid per patient   Anxiety    Arthritis    Back pain    Depression    Food allergy    Hyperlipidemia    Hypertension    "white coat syndrome" per patient   Lactose intolerance    Metabolic syndrome    per patient- seeing weight loss MD for this   Morton's neuroma    Obesity    Pneumonia    "walking pneumonia 16-17 yr ago"   Pre-diabetes    history of pre-DM- "out of zone" d/t weight loss   Sleep apnea    uses CPAP    Allergies  Allergen Reactions   Grass Pollen(K-O-R-T-Swt Vern) Other (See Comments)   Molds & Smuts Other (See Comments)   Pollen Extract    Thimerosal (Thiomersal) Itching    Eye redness      Review of Systems:  No headache, visual changes, nausea, vomiting, diarrhea, constipation, dizziness, abdominal pain, skin rash, fevers, chills, night sweats, weight loss, swollen  lymph nodes, body aches, joint swelling, chest pain, shortness of breath, mood changes. POSITIVE muscle aches  Objective  Blood pressure 122/84, pulse 78, height '5\' 6"'$  (1.676 m).   General: No apparent distress alert and oriented x3 mood and affect normal, dressed appropriately.  HEENT: Pupils equal, extraocular movements intact  Respiratory: Patient's speak in full sentences and does not appear short of breath  Cardiovascular: No lower extremity edema, non tender, no erythema  Antalgic gait noted.  Patient does have an effusion noted of the right knee.  Patient lacks the last 15 degrees of flexion.  Mild crepitus noted.  Low back exam does have some loss of lordosis.  Some tenderness to palpation noted.  More around the right sacroiliac joint.  Limited muscular skeletal ultrasound was performed and interpreted by Hulan Saas, M  Limited ultrasound of patient's right knee shows the patient does have hypoechoic changes of the patellofemoral joint.  Is consistent with more effusion.  Patient does have some mild thickening noted of the synovium as well.  No cortical irregularity noted.  Medial and lateral meniscus appear to be unremarkable.  Varicose vein noted in the popliteal area but no true Baker's cyst. Impression: Knee effusion with mild synovitis  Osteopathic findings  C2 flexed rotated and side bent right C6 flexed  rotated and side bent left T3 extended rotated and side bent right inhaled rib T9 extended rotated and side bent left L2 flexed rotated and side bent right Sacrum right on right       Assessment and Plan:  Effusion of right knee Effusion of the right knee noted and likely some mild patellofemoral arthritis.  We discussed with patient that from the injury that this is likely more secondary to the injury itself.  We discussed compression, meloxicam, home exercises.  Follow-up with me again in 3 to 4 weeks.  If worsening symptoms consider injection and formal physical  therapy for any instability advanced imaging would be warranted.  SI (sacroiliac) joint dysfunction Chronic problem with worsening symptoms secondary to compensation for patient's effusion of the right knee.  Discussed with patient about icing regimen and home exercises.  Discussed core strengthening still.  Increase activity slowly.  Follow-up again in 8 to 10 weeks.    Nonallopathic problems  Decision today to treat with OMT was based on Physical Exam  After verbal consent patient was treated with HVLA, ME, FPR techniques in cervical, rib, thoracic, lumbar, and sacral  areas  Patient tolerated the procedure well with improvement in symptoms  Patient given exercises, stretches and lifestyle modifications  See medications in patient instructions if given  Patient will follow up in 4-8 weeks             Note: This dictation was prepared with Dragon dictation along with smaller phrase technology. Any transcriptional errors that result from this process are unintentional.

## 2023-01-03 NOTE — Assessment & Plan Note (Signed)
Effusion of the right knee noted and likely some mild patellofemoral arthritis.  We discussed with patient that from the injury that this is likely more secondary to the injury itself.  We discussed compression, meloxicam, home exercises.  Follow-up with me again in 3 to 4 weeks.  If worsening symptoms consider injection and formal physical therapy for any instability advanced imaging would be warranted.

## 2023-01-03 NOTE — Patient Instructions (Addendum)
Xray today PF exercises Meloxicam '15mg'$  for 10 days then as needed Ice and compression  Keep appointment

## 2023-01-12 NOTE — Progress Notes (Signed)
[place] Phone: [phone] Subjective:   [name], am serving as a scribe for Dr. Hulan Saas.  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 We discussed with patient that from the injury that this is likely more secondary to the injury itself. We discussed compression, meloxicam, home exercises. Follow-up with me again in 3 to 4 weeks. If worsening symptoms consider injection and formal physical therapy for any instability advanced imaging would be warranted.   Update 01/17/2023 Saahir Gidcumb is a 67 y.o. male coming in with complaint of back and neck pain. OMT 01/03/2023.Also f/u for R knee pain. Patient states that he has some tightness in back of his knee but is over 90% better. Feels like he is unable to fully flex the knee.   Has stiffness in neck. Lumbar spine is doing ok.   Medications patient has been prescribed: Meloxicam  Taking:         Reviewed prior external information including notes and imaging from previsou exam, outside providers and external EMR if available.   As well as notes that were available from care everywhere and other healthcare systems.  Past medical history, social, surgical and family history all reviewed in electronic medical record.  No pertanent information unless stated regarding to the chief complaint.   Past Medical History:  Diagnosis Date   Allergy    Anemia    as a kid per patient   Anxiety    Arthritis    Back pain    Depression    Food allergy    Hyperlipidemia    Hypertension    "white coat syndrome" per patient   Lactose intolerance    Metabolic syndrome    per patient- seeing weight loss MD for this   Morton's neuroma    Obesity    Pneumonia    "walking pneumonia 16-17 yr ago"    Pre-diabetes    history of pre-DM- "out of zone" d/t weight loss   Sleep apnea    uses CPAP    Allergies  Allergen Reactions   Grass Pollen(K-O-R-T-Swt Vern) Other (See Comments)   Molds & Smuts Other (See Comments)   Pollen Extract    Thimerosal (Thiomersal) Itching    Eye redness      Review of Systems:  No headache, visual changes, nausea, vomiting, diarrhea, constipation, dizziness, abdominal pain, skin rash, fevers, chills, night sweats, weight loss, swollen lymph nodes, body aches, joint swelling, chest pain, shortness of breath, mood changes. POSITIVE muscle aches  Objective  Blood pressure 122/88, pulse 73, height 5' 6"$  (1.676 m), weight 191 lb (86.6 kg), SpO2 95 %.   General: No apparent distress alert and oriented x3 mood and affect normal, dressed appropriately.  HEENT: Pupils equal, extraocular movements intact  Respiratory: Patient's speak in full sentences and does not appear short of breath  Cardiovascular: No lower extremity edema, non tender, no erythema  Back pain does have significant loss of lordosis but still not having improvement in core strengthening at the moment.  Patient's right knee does have lacking the last 5 degrees of flexion but otherwise fairly good range of motion.  No crepitus noted on exam today.  Osteopathic findings  C2 flexed rotated and side bent right C6 flexed rotated and side  bent left T3 extended rotated and side bent right inhaled rib T9 extended rotated and side bent left L2 flexed rotated and side bent right Sacrum right on right       Assessment and Plan:  SI (sacroiliac) joint dysfunction Continues to have some tightness noted.  Discussed core strengthening.  Patient is working on position and is definitely adding more muscle than fat.  Very excited that patient will continue to make improvement.  Discussed with patient icing regimen and home exercises, follow-up again in 6 to 8 weeks  Effusion of right  knee Significant decrease already at this point.  Deferred the ultrasound with patient making such significant improvement and likely more of a sprain.  We did discuss that there was some mild patellofemoral arthritis.  I do believe that patient should do well.  If worsening pain can consider once again physical therapy, injection or advanced imaging but highly unlikely at this time    Nonallopathic problems  Decision today to treat with OMT was based on Physical Exam  After verbal consent patient was treated with HVLA, ME, FPR techniques in cervical, rib, thoracic, lumbar, and sacral  areas  Patient tolerated the procedure well with improvement in symptoms  Patient given exercises, stretches and lifestyle modifications  See medications in patient instructions if given  Patient will follow up in 4-8 weeks    The above documentation has been reviewed and is accurate and complete [name], DO          Note: This dictation was prepared with Dragon dictation along with smaller phrase technology. Any transcriptional errors that result from this process are unintentional.

## 2023-01-18 ENCOUNTER — Ambulatory Visit: Payer: BC Managed Care – PPO | Admitting: Family Medicine

## 2023-01-18 ENCOUNTER — Encounter: Payer: Self-pay | Admitting: Family Medicine

## 2023-01-18 VITALS — BP 122/88 | HR 73 | Ht 66.0 in | Wt 191.0 lb

## 2023-01-18 DIAGNOSIS — M9903 Segmental and somatic dysfunction of lumbar region: Secondary | ICD-10-CM

## 2023-01-18 DIAGNOSIS — M9901 Segmental and somatic dysfunction of cervical region: Secondary | ICD-10-CM

## 2023-01-18 DIAGNOSIS — M9904 Segmental and somatic dysfunction of sacral region: Secondary | ICD-10-CM

## 2023-01-18 DIAGNOSIS — M9908 Segmental and somatic dysfunction of rib cage: Secondary | ICD-10-CM

## 2023-01-18 DIAGNOSIS — M25461 Effusion, right knee: Secondary | ICD-10-CM

## 2023-01-18 DIAGNOSIS — M533 Sacrococcygeal disorders, not elsewhere classified: Secondary | ICD-10-CM | POA: Diagnosis not present

## 2023-01-18 DIAGNOSIS — M9902 Segmental and somatic dysfunction of thoracic region: Secondary | ICD-10-CM

## 2023-01-18 NOTE — Assessment & Plan Note (Signed)
Continues to have some tightness noted.  Discussed core strengthening.  Patient is working on position and is definitely adding more muscle than fat.  Very excited that patient will continue to make improvement.  Discussed with patient icing regimen and home exercises, follow-up again in 6 to 8 weeks

## 2023-01-18 NOTE — Patient Instructions (Signed)
See me in 6-8 weeks 

## 2023-01-18 NOTE — Assessment & Plan Note (Signed)
Significant decrease already at this point.  Deferred the ultrasound with patient making such significant improvement and likely more of a sprain.  We did discuss that there was some mild patellofemoral arthritis.  I do believe that patient should do well.  If worsening pain can consider once again physical therapy, injection or advanced imaging but highly unlikely at this time

## 2023-01-30 ENCOUNTER — Other Ambulatory Visit: Payer: Self-pay | Admitting: Internal Medicine

## 2023-03-06 NOTE — Progress Notes (Unsigned)
[name] [address] [place] [address] Phone: [phone] Subjective:   [name], am serving as a scribe for Dr. Antoine Primas.  I'm seeing this patient by the request  of:  Ardith Dark, MD  CC: Back and neck pain  [id number]  01/18/2023 Significant decrease already at this point.  Deferred the ultrasound with patient making such significant improvement and likely more of a sprain.  We did discuss that there was some mild patellofemoral arthritis.  I do believe that patient should do well.  If worsening pain can consider once again physical therapy, injection or advanced imaging but highly unlikely at this time     Update 03/09/2023 Lucas Young is a 67 y.o. male coming in with complaint of back and neck pain. OMT 01/18/2023. Patient states that his neck is stiff today. Lower back pain has improved.   Medications patient has been prescribed:   Taking:         Reviewed prior external information including notes and imaging from previsou exam, outside providers and external EMR if available.   As well as notes that were available from care everywhere and other healthcare systems.  Past medical history, social, surgical and family history all reviewed in electronic medical record.  No pertanent information unless stated regarding to the chief complaint.   Past Medical History:  Diagnosis Date   Allergy    Anemia    as a kid per patient   Anxiety    Arthritis    Back pain    Depression    Food allergy    Hyperlipidemia    Hypertension    "white coat syndrome" per patient   Lactose intolerance    Metabolic syndrome    per patient- seeing weight loss MD for this   Morton's neuroma    Obesity    Pneumonia    "walking pneumonia 16-17 yr ago"   Pre-diabetes    history of pre-DM- "out of zone" d/t weight loss   Sleep apnea    uses CPAP    Allergies  Allergen Reactions   Grass Pollen(K-O-R-T-Swt  Vern) Other (See Comments)   Molds & Smuts Other (See Comments)   Pollen Extract    Thimerosal (Thiomersal) Itching    Eye redness      Review of Systems:  No headache, visual changes, nausea, vomiting, diarrhea, constipation, dizziness, abdominal pain, skin rash, fevers, chills, night sweats, weight loss, swollen lymph nodes, body aches, joint swelling, chest pain, shortness of breath, mood changes. POSITIVE muscle aches  Objective  Blood pressure 120/80, pulse 74, height 5\' 6"  (1.676 m), SpO2 96 %.   General: No apparent distress alert and oriented x3 mood and affect normal, dressed appropriately.  HEENT: Pupils equal, extraocular movements intact  Respiratory: Patient's speak in full sentences and does not appear short of breath  Cardiovascular: No lower extremity edema, non tender, no erythema  Low back still has some loss of lordosis noted.  Still working on core strength but still some weakness of the hip abductors.  Tightness with FABER test right greater than left.  Osteopathic findings  C2 flexed rotated and side bent right C7 flexed rotated and side bent left T3 extended rotated and side bent right inhaled rib L3 flexed rotated and side bent right Sacrum right on right       Assessment and Plan:  SI (sacroiliac) joint dysfunction Discussed HEP  Discussed which activity to do and which ones  to avoid.  Overall though patient would be considered stable.  Not taking any medications on a regular basis.  Follow-up again in 6 to 8 weeks    Nonallopathic problems  Decision today to treat with OMT was based on Physical Exam  After verbal consent patient was treated with HVLA, ME, FPR techniques in cervical, rib, thoracic, lumbar, and sacral  areas  Patient tolerated the procedure well with improvement in symptoms  Patient given exercises, stretches and lifestyle modifications  See medications in patient instructions if given  Patient will follow up in 6-8 weeks     The above documentation has been reviewed and is accurate and complete Judi Saa, DO          Note: This dictation was prepared with Dragon dictation along with smaller phrase technology. Any transcriptional errors that result from this process are unintentional.

## 2023-03-09 ENCOUNTER — Ambulatory Visit: Payer: BC Managed Care – PPO | Admitting: Family Medicine

## 2023-03-09 ENCOUNTER — Encounter: Payer: Self-pay | Admitting: Family Medicine

## 2023-03-09 VITALS — BP 120/80 | HR 74 | Ht 66.0 in

## 2023-03-09 DIAGNOSIS — M9904 Segmental and somatic dysfunction of sacral region: Secondary | ICD-10-CM

## 2023-03-09 DIAGNOSIS — M9901 Segmental and somatic dysfunction of cervical region: Secondary | ICD-10-CM | POA: Diagnosis not present

## 2023-03-09 DIAGNOSIS — M9908 Segmental and somatic dysfunction of rib cage: Secondary | ICD-10-CM

## 2023-03-09 DIAGNOSIS — M533 Sacrococcygeal disorders, not elsewhere classified: Secondary | ICD-10-CM

## 2023-03-09 DIAGNOSIS — M9903 Segmental and somatic dysfunction of lumbar region: Secondary | ICD-10-CM

## 2023-03-09 DIAGNOSIS — M9902 Segmental and somatic dysfunction of thoracic region: Secondary | ICD-10-CM

## 2023-03-09 NOTE — Patient Instructions (Addendum)
Good to see you   Follow up in 7-8 weeks 

## 2023-03-09 NOTE — Assessment & Plan Note (Addendum)
Discussed HEP  Discussed which activity to do and which ones to avoid.  Overall though patient would be considered stable.  Not taking any medications on a regular basis.  Follow-up again in 6 to 8 weeks

## 2023-04-06 ENCOUNTER — Ambulatory Visit (INDEPENDENT_AMBULATORY_CARE_PROVIDER_SITE_OTHER): Payer: BC Managed Care – PPO | Admitting: Family Medicine

## 2023-04-06 ENCOUNTER — Encounter: Payer: Self-pay | Admitting: Family Medicine

## 2023-04-06 VITALS — BP 145/89 | HR 77 | Temp 97.5°F | Ht 66.0 in

## 2023-04-06 DIAGNOSIS — D352 Benign neoplasm of pituitary gland: Secondary | ICD-10-CM | POA: Diagnosis not present

## 2023-04-06 DIAGNOSIS — I10 Essential (primary) hypertension: Secondary | ICD-10-CM

## 2023-04-06 DIAGNOSIS — Z0001 Encounter for general adult medical examination with abnormal findings: Secondary | ICD-10-CM

## 2023-04-06 DIAGNOSIS — E669 Obesity, unspecified: Secondary | ICD-10-CM

## 2023-04-06 DIAGNOSIS — G4733 Obstructive sleep apnea (adult) (pediatric): Secondary | ICD-10-CM | POA: Diagnosis not present

## 2023-04-06 DIAGNOSIS — E785 Hyperlipidemia, unspecified: Secondary | ICD-10-CM | POA: Diagnosis not present

## 2023-04-06 DIAGNOSIS — F419 Anxiety disorder, unspecified: Secondary | ICD-10-CM

## 2023-04-06 DIAGNOSIS — Z0000 Encounter for general adult medical examination without abnormal findings: Secondary | ICD-10-CM | POA: Diagnosis not present

## 2023-04-06 DIAGNOSIS — Z125 Encounter for screening for malignant neoplasm of prostate: Secondary | ICD-10-CM | POA: Diagnosis not present

## 2023-04-06 DIAGNOSIS — R739 Hyperglycemia, unspecified: Secondary | ICD-10-CM | POA: Diagnosis not present

## 2023-04-06 DIAGNOSIS — R7989 Other specified abnormal findings of blood chemistry: Secondary | ICD-10-CM

## 2023-04-06 DIAGNOSIS — E559 Vitamin D deficiency, unspecified: Secondary | ICD-10-CM

## 2023-04-06 LAB — COMPREHENSIVE METABOLIC PANEL
ALT: 29 U/L (ref 0–53)
AST: 30 U/L (ref 0–37)
Albumin: 4.4 g/dL (ref 3.5–5.2)
Alkaline Phosphatase: 49 U/L (ref 39–117)
BUN: 17 mg/dL (ref 6–23)
CO2: 31 mEq/L (ref 19–32)
Calcium: 9.3 mg/dL (ref 8.4–10.5)
Chloride: 101 mEq/L (ref 96–112)
Creatinine, Ser: 0.9 mg/dL (ref 0.40–1.50)
GFR: 89.57 mL/min (ref >60.00)
Glucose, Bld: 85 mg/dL (ref 70–99)
Potassium: 3.5 mEq/L (ref 3.5–5.1)
Sodium: 141 mEq/L (ref 135–145)
Total Bilirubin: 0.6 mg/dL (ref 0.2–1.2)
Total Protein: 6.8 g/dL (ref 6.0–8.3)

## 2023-04-06 LAB — TSH: TSH: 2.68 u[IU]/mL (ref 0.35–5.50)

## 2023-04-06 LAB — CBC
HCT: 44.1 % (ref 39.0–52.0)
Hemoglobin: 14.9 g/dL (ref 13.0–17.0)
MCHC: 33.9 g/dL (ref 30.0–36.0)
MCV: 85.5 fl (ref 78.0–100.0)
Platelets: 263 10*3/uL (ref 150.0–400.0)
RBC: 5.16 Mil/uL (ref 4.22–5.81)
RDW: 13.6 % (ref 11.5–15.5)
WBC: 7.6 10*3/uL (ref 4.0–10.5)

## 2023-04-06 LAB — LIPID PANEL
Cholesterol: 156 mg/dL (ref 0–200)
HDL: 36.9 mg/dL — ABNORMAL LOW (ref >39.00)
LDL Cholesterol: 98 mg/dL (ref 0–99)
NonHDL: 119.11
Total CHOL/HDL Ratio: 4
Triglycerides: 108 mg/dL (ref 0.0–149.0)
VLDL: 21.6 mg/dL (ref 0.0–40.0)

## 2023-04-06 LAB — PSA: PSA: 0.5 ng/mL (ref 0.10–4.00)

## 2023-04-06 LAB — TESTOSTERONE: Testosterone: 379 ng/dL (ref 300.00–890.00)

## 2023-04-06 LAB — HEMOGLOBIN A1C: Hgb A1c MFr Bld: 5.8 % (ref 4.6–6.5)

## 2023-04-06 NOTE — Assessment & Plan Note (Signed)
On cabergoline per endocrinology.  Check testosterone and prolactin today.

## 2023-04-06 NOTE — Assessment & Plan Note (Addendum)
BMI 30 today with comorbidities.  Currently on Wegovy.  Follow-up with weight loss clinic for this.  He may have be stopping Wegovy soon due to lack of insurance coverage.

## 2023-04-06 NOTE — Progress Notes (Signed)
Chief Complaint:  Lucas Young is a 67 y.o. male who presents today for his annual comprehensive physical exam.    Assessment/Plan:  Chronic Problems Addressed Today: Low testosterone On cabergoline per endocrinology.  Check testosterone and prolactin today.  OSA (obstructive sleep apnea) No longer on CPAP as he could not tolerate this.  Working with ENT on inspire device.  Anxiety He has been under more stress recently however does feel like this is manageable.  Has had more stress at work.  He is seeing a therapist which works well.  Also exercises routinely which helps.  He does not think he needs to go back on any medications at this point.  Was previously on Effexor couple years ago.  Obesity BMI 30 today with comorbidities.  Currently on Wegovy.  Follow-up with weight loss clinic for this.  He may have be stopping Wegovy soon due to lack of insurance coverage.  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  On cabergoline.  Will check labs including prolactin and testosterone.  Screening for malignant neoplasm of prostate Check PSA.  Hyperglycemia Check A1c.  Essential hypertension Blood pressure elevated today though home readings are all at goal.  Continue telmisartan 40 mg daily.  Will continue to monitor at home and let us know if persistently elevated.  Preventative Healthcare: Check labs.  He will come back for Prevnar 20.  Up-to-date on other vaccines and screenings.  Patient Counseling(The following topics were reviewed and/or handout was given):  -Nutrition: Stressed importance of moderation in sodium/caffeine intake, saturated fat and cholesterol, caloric balance, sufficient intake of fresh fruits, vegetables, and fiber.  -Stressed the importance of regular exercise.   -Substance Abuse: Discussed cessation/primary prevention of tobacco, alcohol, or other drug use; driving or other dangerous activities under the influence; availability of  treatment for abuse.   -Injury prevention: Discussed safety belts, safety helmets, smoke detector, smoking near bedding or upholstery.   -Sexuality: Discussed sexually transmitted diseases, partner selection, use of condoms, avoidance of unintended pregnancy and contraceptive alternatives.   -Dental health: Discussed importance of regular tooth brushing, flossing, and dental visits.  -Health maintenance and immunizations reviewed. Please refer to Health maintenance section.  Return to care in 1 year for next preventative visit.     Subjective:  HPI:  He has no acute complaints today.   See Assessment / plan for status of chronic conditions. He is here today for follow up. He has been following with the weight clinic. They have him on [facility]. His insurance will stop paying for this and they are currently looking for alternatives to this.  Is also been having some issues with elevated blood pressure and has weight loss physician started him on telmisartan.  He has been tolerating this well.  Lifestyle Diet: Balanced.  Exercise: Works out routinely.      04/06/2023    8:55 AM  Depression screen PHQ 2/9  Decreased Interest 0  Down, Depressed, Hopeless 0  PHQ - 2 Score 0    Health Maintenance Due  Topic Date Due   COVID-19 Vaccine (4 - 2023-24 season) 07/29/2022   Pneumonia Vaccine 35+ Years old (1 of 1 - PCV) Never done     ROS: Per HPI, otherwise a complete review of systems was negative.   PMH:  The following were reviewed and entered/updated in epic: Past Medical History:  Diagnosis Date   Allergy    Anemia    as a kid per patient   Anxiety    Arthritis  Back pain    Depression    Food allergy    Hyperlipidemia    Hypertension    "white coat syndrome" per patient   Lactose intolerance    Metabolic syndrome    per patient- seeing weight loss MD for this   Morton's neuroma    Obesity    Pneumonia    "walking pneumonia 16-17 yr ago"   Pre-diabetes    history  of pre-DM- "out of zone" d/t weight loss   Sleep apnea    uses CPAP   Patient Active Problem List   Diagnosis Date Noted   Essential hypertension 04/06/2023   Effusion of right knee 01/03/2023   Degenerative disc disease, cervical 11/02/2021   Right lateral epicondylitis 09/07/2021   Screening for malignant neoplasm of prostate 06/01/2021   Hyperglycemia 06/01/2021   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   OSA (obstructive sleep apnea) 03/15/2021   Allergic rhinitis 03/15/2021   Anxiety 03/15/2021   Obesity 03/15/2021   Vitamin D deficiency 01/29/2021   Low testosterone 01/29/2021   Bilateral shoulder pain 12/23/2020   Nonallopathic lesion of rib cage 07/02/2018   Nonallopathic lesion of cervical region 07/02/2018   SI (sacroiliac) joint dysfunction 12/21/2015   Paronychia of third finger of left hand 08/03/2015   Lumbar back pain 12/22/2014   Morton's neuroma of left foot 12/22/2014   Nonallopathic lesion of lumbosacral region 12/22/2014   Nonallopathic lesion of sacral region 12/22/2014   Nonallopathic lesion of thoracic region 12/22/2014   Past Surgical History:  Procedure Laterality Date   APPENDECTOMY     COLONOSCOPY  2010   Dr.Medoff  normal per pt   DRUG INDUCED ENDOSCOPY N/A 02/01/2022   Procedure: DRUG INDUCED SPLEEP ENDOSCOPY;  Surgeon: Christia Reading, MD;  Location: [facility];  Service: ENT;  Laterality: N/A;   HEMATOMA EVACUATION Right 04/28/2022   Procedure: EVACUATION HEMATOMA OF NECK;  Surgeon: Christia Reading, MD;  Location: [facility] OR;  Service: ENT;  Laterality: Right;   IMPLANTATION OF HYPOGLOSSAL NERVE STIMULATOR Right 04/26/2022   Procedure: IMPLANTATION OF HYPOGLOSSAL NERVE STIMULATOR;  Surgeon: Christia Reading, MD;  Location: [facility] OR;  Service: ENT;  Laterality: Right;   UMBILICAL HERNIA REPAIR      Family History  Problem Relation Age of Onset   Hypertension Mother    Sleep apnea Mother    Obesity Mother    Colon polyps Father    Hypertension  Father    Hyperlipidemia Father    Heart disease Father    Sleep apnea Father    Obesity Father    Hypertension Maternal Grandmother    Hypertension Maternal Grandfather    Other Neg Hx        low testosterone   Colon cancer Neg Hx    Esophageal cancer Neg Hx    Stomach cancer Neg Hx    Rectal cancer Neg Hx     Medications- reviewed and updated Current Outpatient Medications  Medication Sig Dispense Refill   Ascorbic Acid (VITAMIN C PO) Take 1 tablet by mouth daily.     cabergoline (DOSTINEX) 0.5 MG tablet TAKE TWO TABLETS BY MOUTH TWICE A WEEK 16 tablet 3   desloratadine (CLARINEX) 5 MG tablet Take 5 mg by mouth daily as needed (allergies).     EPINEPHrine 0.3 mg/0.3 mL IJ SOAJ injection Inject 0.3 mg into the muscle as needed for anaphylaxis.     fluticasone (FLONASE) 50 MCG/ACT nasal spray Place 1 spray into both nostrils daily as needed for allergies.  montelukast (SINGULAIR) 10 MG tablet Take 10 mg by mouth daily as needed (allergies).     Multiple Vitamin (MULTIVITAMIN) capsule Take 1 capsule by mouth daily.     Polyethyl Glycol-Propyl Glycol (SYSTANE OP) Place 1 drop into both eyes daily as needed (dry eyes).     telmisartan (MICARDIS) 40 MG tablet      VITAMIN D PO Take 1 capsule by mouth daily.     VITAMIN K PO Take 1 capsule by mouth daily.     WEGOVY 2.4 MG/0.75ML SOAJ Inject 2.4 mg into the skin once a week.     No current facility-administered medications for this visit.    Allergies-reviewed and updated Allergies  Allergen Reactions   Grass Pollen(K-O-R-T-Swt Vern) Other (See Comments)   Molds & Smuts Other (See Comments)   Pollen Extract    Thimerosal (Thiomersal) Itching    Eye redness     Social History   Socioeconomic History   Marital status: Married    Spouse name: Not on file   Number of children: Not on file   Years of education: Not on file   Highest education level: Not on file  Occupational History   Occupation: Professor  Tobacco Use    Smoking status: Never   Smokeless tobacco: Never  Vaping Use   Vaping Use: Never used  Substance and Sexual Activity   Alcohol use: No    Alcohol/week: 0.0 standard drinks of alcohol   Drug use: No   Sexual activity: Not on file  Other Topics Concern   Not on file  Social History Narrative   Not on file   Social Determinants of Health   Financial Resource Strain: Not on file  Food Insecurity: Not on file  Transportation Needs: Not on file  Physical Activity: Not on file  Stress: Not on file  Social Connections: Not on file        Objective:  Physical Exam: BP (!) 145/89   Pulse 77   Temp (!) 97.5 F (36.4 C) (Temporal)   Ht 5\' 6"  (1.676 m)   SpO2 94%   BMI 30.83 kg/m   Body mass index is 30.83 kg/m. Wt Readings from Last 3 Encounters:  01/18/23 191 lb (86.6 kg)  11/07/22 193 lb (87.5 kg)  10/18/22 188 lb (85.3 kg)   Gen: NAD, resting comfortably HEENT: TMs normal bilaterally. OP clear. No thyromegaly noted.  CV: RRR with no murmurs appreciated Pulm: NWOB, CTAB with no crackles, wheezes, or rhonchi GI: Normal bowel sounds present. Soft, Nontender, Nondistended. MSK: no edema, cyanosis, or clubbing noted Skin: warm, dry Neuro: CN2-12 grossly intact. Strength 5/5 in upper and lower extremities. Reflexes symmetric and intact bilaterally.  Psych: Normal affect and thought content     Dameon Soltis M. Jimmey Ralph, MD 04/06/2023 10:06 AM

## 2023-04-06 NOTE — Assessment & Plan Note (Signed)
Follows with endocrinology.  On cabergoline.  Will check labs including prolactin and testosterone.

## 2023-04-06 NOTE — Assessment & Plan Note (Signed)
Check PSA. ?

## 2023-04-06 NOTE — Assessment & Plan Note (Signed)
He has been under more stress recently however does feel like this is manageable.  Has had more stress at work.  He is seeing a therapist which works well.  Also exercises routinely which helps.  He does not think he needs to go back on any medications at this point.  Was previously on Effexor couple years ago.

## 2023-04-06 NOTE — Assessment & Plan Note (Signed)
No longer on CPAP as he could not tolerate this.  Working with ENT on inspire device.

## 2023-04-06 NOTE — Assessment & Plan Note (Signed)
Check A1c. 

## 2023-04-06 NOTE — Assessment & Plan Note (Signed)
Blood pressure elevated today though home readings are all at goal.  Continue telmisartan 40 mg daily.  Will continue to monitor at home and let us know if persistently elevated.

## 2023-04-06 NOTE — Patient Instructions (Addendum)
It was very nice to see you today!  We will check blood work today.   Please come back soon for the pneumonia vaccine.  Keep an eye on your blood pressure and let us know if it is persistently elevated.  Return in about 1 year (around 04/05/2024) for Annual Physical.   Take care, Dr Jimmey Ralph  PLEASE NOTE:  If you had any lab tests, please let us know if you have not heard back within a few days. You may see your results on mychart before we have a chance to review them but we will give you a call once they are reviewed by [place].   If we ordered any referrals today, please let us know if you have not heard from their office within the next week.   If you had any urgent prescriptions sent in today, please check with the pharmacy within an hour of our visit to make sure the prescription was transmitted appropriately.   Please try these tips to maintain a healthy lifestyle:  Eat at least 3 REAL meals and 1-2 snacks per day.  Aim for no more than 5 hours between eating.  If you eat breakfast, please do so within one hour of getting up.   Each meal should contain half fruits/vegetables, one quarter protein, and one quarter carbs (no bigger than a computer mouse)  Cut down on sweet beverages. This includes juice, soda, and sweet tea.   Drink at least 1 glass of water with each meal and aim for at least 8 glasses per day  Exercise at least 150 minutes every week.     Preventive Care 41 Years and Older, Male Preventive care refers to lifestyle choices and visits with your health care provider that can promote health and wellness. Preventive care visits are also called wellness exams. What can I expect for my preventive care visit? Counseling During your preventive care visit, your health care provider may ask about your: Medical history, including: Past medical problems. Family medical history. History of falls. Current health, including: Emotional well-being. Home life and relationship  well-being. Sexual activity. Memory and ability to understand (cognition). Lifestyle, including: Alcohol, nicotine or tobacco, and drug use. Access to firearms. Diet, exercise, and sleep habits. Work and work Astronomer. Sunscreen use. Safety issues such as seatbelt and bike helmet use. Physical exam Your health care provider will check your: Height and weight. These may be used to calculate your BMI (body mass index). BMI is a measurement that tells if you are at a healthy weight. Waist circumference. This measures the distance around your waistline. This measurement also tells if you are at a healthy weight and may help predict your risk of certain diseases, such as type 2 diabetes and high blood pressure. Heart rate and blood pressure. Body temperature. Skin for abnormal spots. What immunizations do I need?  Vaccines are usually given at various ages, according to a schedule. Your health care provider will recommend vaccines for you based on your age, medical history, and lifestyle or other factors, such as travel or where you work. What tests do I need? Screening Your health care provider may recommend screening tests for certain conditions. This may include: Lipid and cholesterol levels. Diabetes screening. This is done by checking your blood sugar (glucose) after you have not eaten for a while (fasting). Hepatitis C test. Hepatitis B test. HIV (human immunodeficiency virus) test. STI (sexually transmitted infection) testing, if you are at risk. Lung cancer screening. Colorectal cancer screening. Prostate  cancer screening. Abdominal aortic aneurysm (AAA) screening. You may need this if you are a current or former smoker. Talk with your health care provider about your test results, treatment options, and if necessary, the need for more tests. Follow these instructions at home: Eating and drinking  Eat a diet that includes fresh fruits and vegetables, whole grains, lean  protein, and low-fat dairy products. Limit your intake of foods with high amounts of sugar, saturated fats, and salt. Take vitamin and mineral supplements as recommended by your health care provider. Do not drink alcohol if your health care provider tells you not to drink. If you drink alcohol: Limit how much you have to 0-2 drinks a day. Know how much alcohol is in your drink. In the [place] one drink equals one 12 oz bottle of beer (355 mL), one 5 oz glass of wine (148 mL), or one 1 oz glass of hard liquor (44 mL). Lifestyle Brush your teeth every morning and night with fluoride toothpaste. Floss one time each day. Exercise for at least 30 minutes 5 or more days each week. Do not use any products that contain nicotine or tobacco. These products include cigarettes, chewing tobacco, and vaping devices, such as e-cigarettes. If you need help quitting, ask your health care provider. Do not use drugs. If you are sexually active, practice safe sex. Use a condom or other form of protection to prevent STIs. Take aspirin only as told by your health care provider. Make sure that you understand how much to take and what form to take. Work with your health care provider to find out whether it is safe and beneficial for you to take aspirin daily. Ask your health care provider if you need to take a cholesterol-lowering medicine (statin). Find healthy ways to manage stress, such as: Meditation, yoga, or listening to music. Journaling. Talking to a trusted person. Spending time with friends and family. Safety Always wear your seat belt while driving or riding in a vehicle. Do not drive: If you have been drinking alcohol. Do not ride with someone who has been drinking. When you are tired or distracted. While texting. If you have been using any mind-altering substances or drugs. Wear a helmet and other protective equipment during sports activities. If you have firearms in your house, make sure you follow  all gun safety procedures. Minimize exposure to UV radiation to reduce your risk of skin cancer. What's next? Visit your health care provider once a year for an annual wellness visit. Ask your health care provider how often you should have your eyes and teeth checked. Stay up to date on all vaccines. This information is not intended to replace advice given to you by your health care provider. Make sure you discuss any questions you have with your health care provider. Document Revised: 05/12/2021 Document Reviewed: 05/12/2021 Elsevier Patient Education  2023 [employer].

## 2023-04-07 LAB — PROLACTIN: Prolactin: 5.3 ng/mL (ref 2.0–18.0)

## 2023-04-10 ENCOUNTER — Encounter: Payer: Self-pay | Admitting: Family Medicine

## 2023-04-10 DIAGNOSIS — E785 Hyperlipidemia, unspecified: Secondary | ICD-10-CM | POA: Insufficient documentation

## 2023-04-10 NOTE — Progress Notes (Signed)
Blood sugar is up slightly into the prediabetic range.  His good cholesterol is a bit lower than optimal however both of these values are both stable.  Do not need to start meds but he should continue to work on diet and exercise and we can recheck these in about a year.  The rest of his labs are all stable and we can recheck in a year.

## 2023-04-25 NOTE — Progress Notes (Unsigned)
[name] [address] [place] [address] Phone: [phone] Subjective:   [name], am serving as a scribe for Dr. Antoine Primas.  I'm seeing this patient by the request  of:  Ardith Dark, MD  CC: Neck and back pain follow-up  [id number]  Lucas Young is a 67 y.o. male coming in with complaint of back and neck pain. OMT 03/09/2023. Patient states that his neck is very stiff today after a road trip.   Medications patient has been prescribed: None          Reviewed prior external information including notes and imaging from previsou exam, outside providers and external EMR if available.   As well as notes that were available from care everywhere and other healthcare systems.  Past medical history, social, surgical and family history all reviewed in electronic medical record.  No pertanent information unless stated regarding to the chief complaint.   Past Medical History:  Diagnosis Date   Allergy    Anemia    as a kid per patient   Anxiety    Arthritis    Back pain    Depression    Food allergy    Hyperlipidemia    Hypertension    "white coat syndrome" per patient   Lactose intolerance    Metabolic syndrome    per patient- seeing weight loss MD for this   Morton's neuroma    Obesity    Pneumonia    "walking pneumonia 16-17 yr ago"   Pre-diabetes    history of pre-DM- "out of zone" d/t weight loss   Sleep apnea    uses CPAP    Allergies  Allergen Reactions   Grass Pollen(K-O-R-T-Swt Vern) Other (See Comments)   Molds & Smuts Other (See Comments)   Pollen Extract    Thimerosal (Thiomersal) Itching    Eye redness      Review of Systems:  No headache, visual changes, nausea, vomiting, diarrhea, constipation, dizziness, abdominal pain, skin rash, fevers, chills, night sweats, weight loss, swollen lymph nodes, body aches, joint swelling, chest pain, shortness of breath, mood changes. POSITIVE  muscle aches  Objective  Blood pressure 122/82, pulse 61, height 5\' 6"  (1.676 m), SpO2 93 %.   General: No apparent distress alert and oriented x3 mood and affect normal, dressed appropriately.  HEENT: Pupils equal, extraocular movements intact  Respiratory: Patient's speak in full sentences and does not appear short of breath  Cardiovascular: No lower extremity edema, non tender, no erythema  Back exam does have some loss lordosis noted.  Some tenderness to palpation of the paraspinal musculature.  Patient does have some limited sidebending of the neck bilaterally.  Osteopathic findings  C2 flexed rotated and side bent right C5 flexed rotated and side bent left T3 extended rotated and side bent right inhaled rib T8 extended rotated and side bent left L2 flexed rotated and side bent right Sacrum right on right    Assessment and Plan:  Degenerative disc disease, cervical Usually stable with mild exacerbation at this time.  Patient has been traveling more frequently.  Patient has not needed any other medications follow-up again in 6 to 8 weeks.    Nonallopathic problems  Decision today to treat with OMT was based on Physical Exam  After verbal consent patient was treated with HVLA, ME, FPR techniques in cervical, rib, thoracic, lumbar, and sacral  areas  Patient tolerated the procedure well with improvement in symptoms  Patient  given exercises, stretches and lifestyle modifications  See medications in patient instructions if given  Patient will follow up in 4-8 weeks     The above documentation has been reviewed and is accurate and complete Judi Saa, DO         Note: This dictation was prepared with Dragon dictation along with smaller phrase technology. Any transcriptional errors that result from this process are unintentional.

## 2023-04-27 ENCOUNTER — Ambulatory Visit: Payer: BC Managed Care – PPO | Admitting: Family Medicine

## 2023-04-27 ENCOUNTER — Encounter: Payer: Self-pay | Admitting: Family Medicine

## 2023-04-27 VITALS — BP 122/82 | HR 61 | Ht 66.0 in

## 2023-04-27 DIAGNOSIS — M5030 Other cervical disc degeneration, unspecified cervical region: Secondary | ICD-10-CM

## 2023-04-27 DIAGNOSIS — M9904 Segmental and somatic dysfunction of sacral region: Secondary | ICD-10-CM

## 2023-04-27 DIAGNOSIS — M9901 Segmental and somatic dysfunction of cervical region: Secondary | ICD-10-CM

## 2023-04-27 DIAGNOSIS — M9903 Segmental and somatic dysfunction of lumbar region: Secondary | ICD-10-CM | POA: Diagnosis not present

## 2023-04-27 DIAGNOSIS — M9908 Segmental and somatic dysfunction of rib cage: Secondary | ICD-10-CM | POA: Diagnosis not present

## 2023-04-27 DIAGNOSIS — M9902 Segmental and somatic dysfunction of thoracic region: Secondary | ICD-10-CM

## 2023-04-27 NOTE — Assessment & Plan Note (Signed)
Usually stable with mild exacerbation at this time.  Patient has been traveling more frequently.  Patient has not needed any other medications follow-up again in 6 to 8 weeks.

## 2023-04-27 NOTE — Patient Instructions (Signed)
Let's see you just [place] you [place] See me in 6-8 weeks

## 2023-05-24 ENCOUNTER — Ambulatory Visit: Payer: BC Managed Care – PPO | Admitting: Internal Medicine

## 2023-05-24 ENCOUNTER — Encounter: Payer: Self-pay | Admitting: Internal Medicine

## 2023-05-24 VITALS — BP 122/74 | HR 69 | Wt 192.0 lb

## 2023-05-24 DIAGNOSIS — D352 Benign neoplasm of pituitary gland: Secondary | ICD-10-CM | POA: Diagnosis not present

## 2023-05-24 NOTE — Progress Notes (Signed)
Name: Lucas Young  MRN/ DOB: [id number], 06-Mar-1956    Age/ Sex: 67 y.o., male     PCP: Ardith Dark, MD   Reason for Endocrinology Evaluation: Hyperprolactinemia/hypogonadism     Initial Endocrinology Clinic Visit: 04/19/2021    PATIENT IDENTIFIER: Lucas Young is a 67 y.o., male with a past medical history of prolactinoma. He has followed with [facility] since 04/19/2021 for consultative assistance with management of his hyperprolactinemia.   HISTORICAL SUMMARY: The patient was first diagnosed with hyperprolactinemia in May 2022 with a prolactin level of 117.5 NG/mL.  He was started on cabergoline at the time   Of note the patient has also been noted with low testosterone at the time with a nadir of 101 NG/dL in 11/3242 but this has normalized    Patient has 2 biological children  Pituitary MRI did not show any pituitary adenoma 04/2021  SUBJECTIVE:    Today (05/24/2023):  Lucas Young is here for a follow-up on hyperprolactinemia and hypogonadism.   Denies headaches  Denies vision changes  Denies galactorrhea  Denies changes in energy level or changes in mood  Cabergoline 0.5 mg 3 tabs  weekly (2 on Mondays and 1 on Friday)      HISTORY:  Past Medical History:  Past Medical History:  Diagnosis Date   Allergy    Anemia    as a kid per patient   Anxiety    Arthritis    Back pain    Depression    Food allergy    Hyperlipidemia    Hypertension    "white coat syndrome" per patient   Lactose intolerance    Metabolic syndrome    per patient- seeing weight loss MD for this   Morton's neuroma    Obesity    Pneumonia    "walking pneumonia 16-17 yr ago"   Pre-diabetes    history of pre-DM- "out of zone" d/t weight loss   Sleep apnea    uses CPAP   Past Surgical History:  Past Surgical History:  Procedure Laterality Date   APPENDECTOMY     COLONOSCOPY  2010   Dr.Medoff  normal per pt   DRUG INDUCED  ENDOSCOPY N/A 02/01/2022   Procedure: DRUG INDUCED SPLEEP ENDOSCOPY;  Surgeon: Christia Reading, MD;  Location: [facility];  Service: ENT;  Laterality: N/A;   HEMATOMA EVACUATION Right 04/28/2022   Procedure: EVACUATION HEMATOMA OF NECK;  Surgeon: Christia Reading, MD;  Location[facility];  Service: ENT;  Laterality: Right;   IMPLANTATION OF HYPOGLOSSAL NERVE STIMULATOR Right 04/26/2022   Procedure: IMPLANTATION OF HYPOGLOSSAL NERVE STIMULATOR;  Surgeon: Christia Reading, MD;  Location: [facility] OR;  Service: ENT;  Laterality: Right;   UMBILICAL HERNIA REPAIR     Social History:  reports that he has never smoked. He has never used smokeless tobacco. He reports that he does not drink alcohol and does not use drugs. Family History:  Family History  Problem Relation Age of Onset   Hypertension Mother    Sleep apnea Mother    Obesity Mother    Colon polyps Father    Hypertension Father    Hyperlipidemia Father    Heart disease Father    Sleep apnea Father    Obesity Father    Hypertension Maternal Grandmother    Hypertension Maternal Grandfather    Other Neg Hx        low testosterone   Colon cancer Neg Hx    Esophageal  cancer Neg Hx    Stomach cancer Neg Hx    Rectal cancer Neg Hx      HOME MEDICATIONS: Allergies as of 05/24/2023       Reactions   Grass Pollen(k-o-r-t-swt Vern) Other (See Comments)   Molds & Smuts Other (See Comments)   Pollen Extract    Thimerosal (thiomersal) Itching   Eye redness         Medication List        Accurate as of May 24, 2023  7:35 AM. If you have any questions, ask your nurse or doctor.          STOP taking these medications    Wegovy 2.4 MG/0.75ML Soaj Generic drug: Semaglutide-Weight Management Stopped by: Scarlette Shorts, MD       TAKE these medications    cabergoline 0.5 MG tablet Commonly known as: DOSTINEX TAKE TWO TABLETS BY MOUTH TWICE A WEEK   desloratadine 5 MG tablet Commonly known as: CLARINEX Take 5 mg by  mouth daily as needed (allergies).   EPINEPHrine 0.3 mg/0.3 mL Soaj injection Commonly known as: EPI-PEN Inject 0.3 mg into the muscle as needed for anaphylaxis.   fluticasone 50 MCG/ACT nasal spray Commonly known as: FLONASE Place 1 spray into both nostrils daily as needed for allergies.   montelukast 10 MG tablet Commonly known as: SINGULAIR Take 10 mg by mouth daily as needed (allergies).   multivitamin capsule Take 1 capsule by mouth daily.   SYSTANE OP Place 1 drop into both eyes daily as needed (dry eyes).   telmisartan 40 MG tablet Commonly known as: MICARDIS   VITAMIN C PO Take 1 tablet by mouth daily.   VITAMIN D PO Take 1 capsule by mouth daily.   VITAMIN K PO Take 1 capsule by mouth daily.          OBJECTIVE:   PHYSICAL EXAM: VS: BP 122/74 (BP Location: Left Arm, Patient Position: Sitting, Cuff Size: Large)   Pulse 69   Wt 192 lb (87.1 kg) Comment: patient reported  SpO2 94%   BMI 30.99 kg/m    EXAM: General: Pt appears well and is in NAD  Lungs: Clear with good BS bilat   Heart: Auscultation: RRR.  Abdomen: Soft, nontender  Extremities:  BL LE: No pretibial edema   Mental Status: Judgment, insight: Intact Orientation: Oriented to time, place, and person Mood and affect: No depression, anxiety, or agitation     DATA REVIEWED:  Latest Reference Range & Units 04/06/23 09:45  Sodium 135 - 145 mEq/L 141  Potassium 3.5 - 5.1 mEq/L 3.5  Chloride 96 - 112 mEq/L 101  CO2 19 - 32 mEq/L 31  Glucose 70 - 99 mg/dL 85  BUN 6 - 23 mg/dL 17  Creatinine 3.29 - 5.18 mg/dL 8.41  Calcium 8.4 - 66.0 mg/dL 9.3  Alkaline Phosphatase 39 - 117 U/L 49  Albumin 3.5 - 5.2 g/dL 4.4  AST 0 - 37 U/L 30  ALT 0 - 53 U/L 29  Total Protein 6.0 - 8.3 g/dL 6.8  Total Bilirubin 0.2 - 1.2 mg/dL 0.6  GFR >63.01 mL/min 89.57    Latest Reference Range & Units 04/06/23 09:45  Prolactin 2.0 - 18.0 ng/mL 5.3  Glucose 70 - 99 mg/dL 85  Hemoglobin S0F 4.6 - 6.5 % 5.8   Testosterone 300.00 - 890.00 ng/dL 093.23  TSH 5.57 - 3.22 uIU/mL 2.68    MRI brain 05/16/2021   Dedicated pituitary protocol was performed. 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 The pituitary parenchyma enhances relatively homogeneously on dynamic protocol without convincing discrete pituitary lesion. The infundibulum is midline and unremarkable. No suprasellar extension of pituitary parenchyma. Unremarkable optic chiasm.    ASSESSMENT / PLAN / RECOMMENDATIONS:   Prolactinoma   -He has responded well to cabergoline -MRI in 2022 did not reveal any pituitary adenoma -We had reduced cabergoline from 4 tablets a week to 3 tablets a week 10/2022, repeat prolactin May 2024 was stable and normal -I have recommended reducing cabergoline as below   Medications   Decrease Cabergoline 0.5 mg, 1 tab twice  weekly ( Monday and Friday)    2.  Low testosterone:   -This has normalized with improving prolactin level    Follow-up in 6 months Labs in 6 weeks    Signed electronically by: Lyndle Herrlich, MD  [facility]  [facility] [address] [place] [place], [place] [address] Phone: [phone] FAX: [phone]      CC: Ardith Dark, MD [address] [place] [address] Phone: [phone]  Fax: [phone]   Return to Endocrinology clinic as below: Future Appointments  Date Time Provider Department Center  06/12/2023 10:30 AM Judi Saa, DO [facility] None  04/08/2024  8:00 AM Ardith Dark, MD [facility]

## 2023-06-12 ENCOUNTER — Encounter: Payer: Self-pay | Admitting: Family Medicine

## 2023-06-12 ENCOUNTER — Ambulatory Visit: Payer: BC Managed Care – PPO | Admitting: Family Medicine

## 2023-06-12 VITALS — BP 120/86 | HR 76 | Ht 66.0 in

## 2023-06-12 DIAGNOSIS — M533 Sacrococcygeal disorders, not elsewhere classified: Secondary | ICD-10-CM | POA: Diagnosis not present

## 2023-06-12 DIAGNOSIS — M9902 Segmental and somatic dysfunction of thoracic region: Secondary | ICD-10-CM | POA: Diagnosis not present

## 2023-06-12 DIAGNOSIS — M9904 Segmental and somatic dysfunction of sacral region: Secondary | ICD-10-CM | POA: Diagnosis not present

## 2023-06-12 DIAGNOSIS — M9908 Segmental and somatic dysfunction of rib cage: Secondary | ICD-10-CM | POA: Diagnosis not present

## 2023-06-12 DIAGNOSIS — M9901 Segmental and somatic dysfunction of cervical region: Secondary | ICD-10-CM

## 2023-06-12 DIAGNOSIS — M9903 Segmental and somatic dysfunction of lumbar region: Secondary | ICD-10-CM

## 2023-06-12 MED ORDER — PREDNISONE 20 MG PO TABS: 40.0000 mg | ORAL_TABLET | Freq: Every day | ORAL | 0 refills | Status: DC

## 2023-06-12 NOTE — Patient Instructions (Signed)
Great to see you Tell Darlin Drop hi See me in 6-8 weeks Prednisone 40mg  for 5 days

## 2023-06-12 NOTE — Assessment & Plan Note (Signed)
Discussed icing regimen and home exercises.  Given prednisone in case there is any exacerbation with [place]  Discussed continuing to work on core strengthening.  Discussed icing regimen and home exercises.  Follow-up again in 6 to 8 weeks otherwise.

## 2023-06-12 NOTE — Progress Notes (Signed)
[facility] [address] [place] [address] Phone: [phone] Subjective:   Lucas Young, am serving as a scribe for Dr. Antoine Primas.  I'm seeing this patient by the request  of:  Ardith Dark, MD  CC: back and pain follow up   Lucas Young:Lucas Young  Lucas Young is a 67 y.o. male coming in with complaint of back and neck pain. Patient feels like his R hip and lower back are "out". Pain deep in R SI and piriformis. Usual stiffness in the neck.   Taking:         Reviewed prior external information including notes and imaging from previsou exam, outside providers and external EMR if available.   As well as notes that were available from care everywhere and other healthcare systems.  Past medical history, social, surgical and family history all reviewed in electronic medical record.  No pertanent information unless stated regarding to the chief complaint.   Past Medical History:  Diagnosis Date   Allergy    Anemia    as a kid per patient   Anxiety    Arthritis    Back pain    Depression    Food allergy    Hyperlipidemia    Hypertension    "white coat syndrome" per patient   Lactose intolerance    Metabolic syndrome    per patient- seeing weight loss MD for this   Morton's neuroma    Obesity    Pneumonia    "walking pneumonia 16-17 yr ago"   Pre-diabetes    history of pre-DM- "out of zone" d/t weight loss   Sleep apnea    uses CPAP    Allergies  Allergen Reactions   Grass Pollen(K-O-R-T-Swt Vern) Other (See Comments)   Molds & Smuts Other (See Comments)   Pollen Extract    Thimerosal (Thiomersal) Itching    Eye redness      Review of Systems:  No headache, visual changes, nausea, vomiting, diarrhea, constipation, dizziness, abdominal pain, skin rash, fevers, chills, night sweats, weight loss, swollen lymph nodes, body aches, joint swelling, chest pain, shortness of breath, mood changes. POSITIVE muscle  aches  Objective  Blood pressure 120/86, pulse 76, height 5\' 6"  (1.676 m), SpO2 95%.   General: No apparent distress alert and oriented x3 mood and affect normal, dressed appropriately.  HEENT: Pupils equal, extraocular movements intact  Respiratory: Patient's speak in full sentences and does not appear short of breath  Cardiovascular: No lower extremity edema, non tender, no erythema  Does have some tenderness to palpation in the paraspinal musculature.  Does have tightness noted in the right sacroiliac joint.  Patient does have anterior ilium noted as well.  This is new than patient's baseline.  Osteopathic findings  C2 flexed rotated and side bent right C7 flexed rotated and side bent right  T3 extended rotated and side bent right inhaled rib T9 extended rotated and side bent left L2 flexed rotated and side bent right L4 flexed rotated and side bent right Sacrum right on right Right anterior ilium      Assessment and Plan:  SI (sacroiliac) joint dysfunction Discussed icing regimen and home exercises.  Given prednisone in case there is any exacerbation with [place].  Discussed continuing to work on core strengthening.  Discussed icing regimen and home exercises.  Follow-up again in 6 to 8 weeks otherwise.    Nonallopathic problems  Decision today to treat with  OMT was based on Physical Exam  After verbal consent patient was treated with HVLA, ME, FPR techniques in cervical, rib, thoracic, lumbar, and sacral  areas  Patient tolerated the procedure well with improvement in symptoms  Patient given exercises, stretches and lifestyle modifications  See medications in patient instructions if given  Patient will follow up in 4-8 weeks    The above documentation has been reviewed and is accurate and complete Judi Saa, DO          Note: This dictation was prepared with Dragon dictation along with smaller phrase technology. Any  transcriptional errors that result from this process are unintentional.

## 2023-07-04 ENCOUNTER — Other Ambulatory Visit: Payer: BC Managed Care – PPO

## 2023-07-21 NOTE — Progress Notes (Unsigned)
[name] [address] [place] [address] Phone: [phone] Subjective:    I'm seeing this patient by the request  of:  Ardith Dark, MD  CC: Back and neck pain follow-up  [id number]  Lucas Young is a 67 y.o. male coming in with complaint of back and neck pain. OMT 06/12/2023. Patient states same per usual. No new concerns.  Medications patient has been prescribed: Prednisone  Taking:         Reviewed prior external information including notes and imaging from previsou exam, outside providers and external EMR if available.   As well as notes that were available from care everywhere and other healthcare systems.  Past medical history, social, surgical and family history all reviewed in electronic medical record.  No pertanent information unless stated regarding to the chief complaint.   Past Medical History:  Diagnosis Date   Allergy    Anemia    as a kid per patient   Anxiety    Arthritis    Back pain    Depression    Food allergy    Hyperlipidemia    Hypertension    "white coat syndrome" per patient   Lactose intolerance    Metabolic syndrome    per patient- seeing weight loss MD for this   Morton's neuroma    Obesity    Pneumonia    "walking pneumonia 16-17 yr ago"   Pre-diabetes    history of pre-DM- "out of zone" d/t weight loss   Sleep apnea    uses CPAP    Allergies  Allergen Reactions   Grass Pollen(K-O-R-T-Swt Vern) Other (See Comments)   Molds & Smuts Other (See Comments)   Pollen Extract    Thimerosal (Thiomersal) Itching    Eye redness      Review of Systems:  No headache, visual changes, nausea, vomiting, diarrhea, constipation, dizziness, abdominal pain, skin rash, fevers, chills, night sweats, weight loss, swollen lymph nodes, body aches, joint swelling, chest pain, shortness of breath, mood changes. POSITIVE muscle aches  Objective  Blood pressure 124/76, pulse 76, height 5'  6" (1.676 m), SpO2 97%.   General: No apparent distress alert and oriented x3 mood and affect normal, dressed appropriately.  HEENT: Pupils equal, extraocular movements intact  Respiratory: Patient's speak in full sentences and does not appear short of breath  Cardiovascular: No lower extremity edema, non tender, no erythema  Gait MSK:  Back mild tenderness in the lordosis noted.  The patient's neck though does have significant tightness bilaterally.  This seems to go into the parascapular region.  See left greater than right though.  Patient does have tightness with FABER right greater than left as well as the lower back.  Negative straight leg test noted.  Osteopathic findings  C3 flexed rotated and side bent right C6 flexed rotated and side bent left T6 extended rotated and side bent right inhaled rib T9 extended rotated and side bent left L2 flexed rotated and side bent right Sacrum right on right       Assessment and Plan:  SI (sacroiliac) joint dysfunction The patient did some tightness.  Did travel on a more regular basis.  Discussed icing regimen and home exercises, discussed avoiding certain activities.  Increase activity slowly.  Follow-up again in 6 to 8 weeks.    Nonallopathic problems  Decision today to treat with OMT was based on Physical Exam  After verbal consent patient was treated with HVLA, ME, FPR  techniques in cervical, rib, thoracic, lumbar, and sacral  areas  Patient tolerated the procedure well with improvement in symptoms  Patient given exercises, stretches and lifestyle modifications  See medications in patient instructions if given  Patient will follow up in 4-8 weeks     The above documentation has been reviewed and is accurate and complete Judi Saa, DO         Note: This dictation was prepared with Dragon dictation along with smaller phrase technology. Any transcriptional errors that result from this process are unintentional.

## 2023-07-24 ENCOUNTER — Ambulatory Visit: Payer: BC Managed Care – PPO | Admitting: Family Medicine

## 2023-07-24 ENCOUNTER — Encounter: Payer: Self-pay | Admitting: Family Medicine

## 2023-07-24 VITALS — BP 124/76 | HR 76 | Ht 66.0 in

## 2023-07-24 DIAGNOSIS — M533 Sacrococcygeal disorders, not elsewhere classified: Secondary | ICD-10-CM

## 2023-07-24 DIAGNOSIS — M9903 Segmental and somatic dysfunction of lumbar region: Secondary | ICD-10-CM | POA: Diagnosis not present

## 2023-07-24 DIAGNOSIS — M9904 Segmental and somatic dysfunction of sacral region: Secondary | ICD-10-CM

## 2023-07-24 DIAGNOSIS — M9902 Segmental and somatic dysfunction of thoracic region: Secondary | ICD-10-CM

## 2023-07-24 DIAGNOSIS — M9908 Segmental and somatic dysfunction of rib cage: Secondary | ICD-10-CM | POA: Diagnosis not present

## 2023-07-24 DIAGNOSIS — M9901 Segmental and somatic dysfunction of cervical region: Secondary | ICD-10-CM

## 2023-07-24 NOTE — Assessment & Plan Note (Signed)
The patient did some tightness.  Did travel on a more regular basis.  Discussed icing regimen and home exercises, discussed avoiding certain activities.  Increase activity slowly.  Follow-up again in 6 to 8 weeks.

## 2023-07-26 ENCOUNTER — Other Ambulatory Visit: Payer: Self-pay | Admitting: Internal Medicine

## 2023-07-26 ENCOUNTER — Other Ambulatory Visit: Payer: BC Managed Care – PPO

## 2023-07-26 DIAGNOSIS — D352 Benign neoplasm of pituitary gland: Secondary | ICD-10-CM

## 2023-07-27 LAB — PROLACTIN: Prolactin: 5.4 ng/mL (ref 2.0–18.0)

## 2023-07-29 LAB — TESTOSTERONE, TOTAL, LC/MS/MS: Testosterone, Total, LC-MS-MS: 580 ng/dL (ref 250–1100)

## 2023-08-01 ENCOUNTER — Other Ambulatory Visit: Payer: Self-pay | Admitting: Internal Medicine

## 2023-08-10 NOTE — Progress Notes (Signed)
[name] [address] [place] [address] Phone: [phone] Subjective:   Lucas Young, am serving as a scribe for Dr. Antoine Primas.  I'm seeing this patient by the request  of:  Ardith Dark, MD  CC: Acute mid back pain  [id number]  Brison Wist is a 67 y.o. male coming in with complaint of back and neck pain. OMT 07/24/2023. Patient states that he has stabbing pain in the R scapula. Took gabapentin which has taken the edge off but during the day his pain increases.  Denies any bowel changes.  Denies any shortness of breath.  Does feel like it did happen after an injury.  Medications patient has been prescribed: None  Taking:         Reviewed prior external information including notes and imaging from previsou exam, outside providers and external EMR if available.   As well as notes that were available from care everywhere and other healthcare systems.  Past medical history, social, surgical and family history all reviewed in electronic medical record.  No pertanent information unless stated regarding to the chief complaint.   Past Medical History:  Diagnosis Date   Allergy    Anemia    as a kid per patient   Anxiety    Arthritis    Back pain    Depression    Food allergy    Hyperlipidemia    Hypertension    "white coat syndrome" per patient   Lactose intolerance    Metabolic syndrome    per patient- seeing weight loss MD for this   Morton's neuroma    Obesity    Pneumonia    "walking pneumonia 16-17 yr ago"   Pre-diabetes    history of pre-DM- "out of zone" d/t weight loss   Sleep apnea    uses CPAP    Allergies  Allergen Reactions   Grass Pollen(K-O-R-T-Swt Vern) Other (See Comments)   Molds & Smuts Other (See Comments)   Pollen Extract    Thimerosal (Thiomersal) Itching    Eye redness      Review of Systems:  No headache, visual changes, nausea, vomiting, diarrhea, constipation,  dizziness, abdominal pain, skin rash, fevers, chills, night sweats, weight loss, swollen lymph nodes, body aches, joint swelling, chest pain, shortness of breath, mood changes. POSITIVE muscle aches  Objective  Blood pressure 114/82, pulse 60, height 5\' 6"  (1.676 m), SpO2 95%.   General: No apparent distress alert and oriented x3 mood and affect normal, dressed appropriately.  HEENT: Pupils equal, extraocular movements intact  Respiratory: Patient's speak in full sentences and does not appear short of breath  Cardiovascular: No lower extremity edema, non tender, no erythema  Back exam does have some loss of lordosis.  Tenderness to palpation more at the T8-T9 area.  Osteopathic findings  C2 flexed rotated and side bent right C6 flexed rotated and side bent left T3 extended rotated and side bent right inhaled rib T9 extended rotated and side bent right  L2 flexed rotated and side bent right Sacrum right on right       Assessment and Plan:  Lumbar back pain More discomfort in the thoracolumbar juncture and more in the T9 area on the right side.  Likely a muscle injury noted.  Discussed icing regimen and home exercises.  Discussed which activities to do and which ones to avoid.  Increase activity slowly over the course of next several weeks discussed icing regimen.  Follow-up  with me again in 6 to 8 weeks otherwise.  New medications prescribed as well including meloxicam and Zanaflex.  Will be used short-term.  Warned of potential side effects.    Nonallopathic problems  Decision today to treat with OMT was based on Physical Exam  After verbal consent patient was treated with HVLA, ME, FPR techniques in cervical, rib, thoracic, lumbar, and sacral  areas  Patient tolerated the procedure well with improvement in symptoms  Patient given exercises, stretches and lifestyle modifications  See medications in patient instructions if given  Patient will follow up in 4-8 weeks      The above documentation has been reviewed and is accurate and complete Judi Saa, DO         Note: This dictation was prepared with Dragon dictation along with smaller phrase technology. Any transcriptional errors that result from this process are unintentional.

## 2023-08-14 ENCOUNTER — Ambulatory Visit: Payer: BC Managed Care – PPO | Admitting: Family Medicine

## 2023-08-14 ENCOUNTER — Encounter: Payer: Self-pay | Admitting: Family Medicine

## 2023-08-14 VITALS — BP 114/82 | HR 60 | Ht 66.0 in

## 2023-08-14 DIAGNOSIS — M9908 Segmental and somatic dysfunction of rib cage: Secondary | ICD-10-CM | POA: Diagnosis not present

## 2023-08-14 DIAGNOSIS — M9902 Segmental and somatic dysfunction of thoracic region: Secondary | ICD-10-CM

## 2023-08-14 DIAGNOSIS — M9903 Segmental and somatic dysfunction of lumbar region: Secondary | ICD-10-CM | POA: Diagnosis not present

## 2023-08-14 DIAGNOSIS — M9904 Segmental and somatic dysfunction of sacral region: Secondary | ICD-10-CM | POA: Diagnosis not present

## 2023-08-14 DIAGNOSIS — M545 Low back pain, unspecified: Secondary | ICD-10-CM

## 2023-08-14 DIAGNOSIS — M9901 Segmental and somatic dysfunction of cervical region: Secondary | ICD-10-CM

## 2023-08-14 MED ORDER — TIZANIDINE HCL 2 MG PO TABS
2.0000 mg | ORAL_TABLET | Freq: Every day | ORAL | 0 refills | Status: DC
Start: 2023-08-14 — End: 2024-10-22

## 2023-08-14 MED ORDER — MELOXICAM 15 MG PO TABS: 15.0000 mg | ORAL_TABLET | Freq: Every day | ORAL | 0 refills | Status: DC

## 2023-08-14 NOTE — Patient Instructions (Addendum)
Good to see you! Good luck with politics Take Meloxicam for 3 days then as needed Zanaflex at night See you again in 4-6 weeks

## 2023-08-14 NOTE — Assessment & Plan Note (Addendum)
More discomfort in the thoracolumbar juncture and more in the T9 area on the right side.  Likely a muscle injury noted.  Discussed icing regimen and home exercises.  Discussed which activities to do and which ones to avoid.  Increase activity slowly over the course of next several weeks discussed icing regimen.  Follow-up with me again in 6 to 8 weeks otherwise.  New medications prescribed as well including meloxicam and Zanaflex.  Will be used short-term.  Warned of potential side effects.

## 2023-09-04 NOTE — Progress Notes (Unsigned)
[name] [address] [place] [address] Phone: [phone] Subjective:   Lucas Young, am serving as a scribe for Dr. Antoine Primas.  I'm seeing this patient by the request  of:  Ardith Dark, MD  CC: Low back does have some loss of lordosis  ION:[id number]  Lucas Young is a 67 y.o. male coming in with complaint of back and neck pain. OMT 08/14/2023. Patient states same per usual. No new concerns.  Medications patient has been prescribed: Meloxicam, Zanaflex       Reviewed prior external information including notes and imaging from previsou exam, outside providers and external EMR if available.   As well as notes that were available from care everywhere and other healthcare systems.  Past medical history, social, surgical and family history all reviewed in electronic medical record.  No pertanent information unless stated regarding to the chief complaint.   Past Medical History:  Diagnosis Date   Allergy    Anemia    as a kid per patient   Anxiety    Arthritis    Back pain    Depression    Food allergy    Hyperlipidemia    Hypertension    "white coat syndrome" per patient   Lactose intolerance    Metabolic syndrome    per patient- seeing weight loss MD for this   Morton's neuroma    Obesity    Pneumonia    "walking pneumonia 16-17 yr ago"   Pre-diabetes    history of pre-DM- "out of zone" d/t weight loss   Sleep apnea    uses CPAP    Allergies  Allergen Reactions   Grass Pollen(K-O-R-T-Swt Vern) Other (See Comments)   Molds & Smuts Other (See Comments)   Pollen Extract    Thimerosal (Thiomersal) Itching    Eye redness      Review of Systems:  No headache, visual changes, nausea, vomiting, diarrhea, constipation, dizziness, abdominal pain, skin rash, fevers, chills, night sweats, weight loss, swollen lymph nodes, body aches, joint swelling, chest pain, shortness of breath, mood changes. POSITIVE  muscle aches  Objective  Blood pressure 126/82, pulse 74, height 5\' 6"  (1.676 m), SpO2 98%.   General: No apparent distress alert and oriented x3 mood and affect normal, dressed appropriately.  HEENT: Pupils equal, extraocular movements intact  Respiratory: Patient's speak in full sentences and does not appear short of breath  Cardiovascular: No lower extremity edema, non tender, no erythema  Low back does have some loss lordosis noted.  Neck exam the right scapular does have significant tightness noted.  Mild crepitus noted.  Some limited range of motion with sidebending of the neck bilaterally.  Osteopathic findings  C2 flexed rotated and side bent right C6 flexed rotated and side bent left T3 extended rotated and side bent right inhaled rib T9 extended rotated and side bent left L2 flexed rotated and side bent right Sacrum right on right    Assessment and Plan:  SI (sacroiliac) joint dysfunction Low back exam does have loss of lordosis  Tightness noted.  Will continue to work on the home exercises.  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  No change in medications.  Responding well to osteopathic manipulation but would like patient to follow-up again in 2 months for further evaluation and treatment.    Nonallopathic problems  Decision today to treat with OMT was based on Physical Exam  After verbal consent patient was treated with HVLA,  ME, FPR techniques in cervical, rib, thoracic, lumbar, and sacral  areas  Patient tolerated the procedure well with improvement in symptoms  Patient given exercises, stretches and lifestyle modifications  See medications in patient instructions if given  Patient will follow up in 4-8 weeks     The above documentation has been reviewed and is accurate and complete Judi Saa, DO         Note: This dictation was prepared with Dragon dictation along with smaller phrase technology. Any transcriptional errors that result from  this process are unintentional.

## 2023-09-05 ENCOUNTER — Ambulatory Visit: Payer: BC Managed Care – PPO | Admitting: Family Medicine

## 2023-09-05 ENCOUNTER — Encounter: Payer: Self-pay | Admitting: Family Medicine

## 2023-09-05 VITALS — BP 126/82 | HR 74 | Ht 66.0 in

## 2023-09-05 DIAGNOSIS — M9901 Segmental and somatic dysfunction of cervical region: Secondary | ICD-10-CM

## 2023-09-05 DIAGNOSIS — M533 Sacrococcygeal disorders, not elsewhere classified: Secondary | ICD-10-CM | POA: Diagnosis not present

## 2023-09-05 DIAGNOSIS — M9904 Segmental and somatic dysfunction of sacral region: Secondary | ICD-10-CM

## 2023-09-05 DIAGNOSIS — M9903 Segmental and somatic dysfunction of lumbar region: Secondary | ICD-10-CM | POA: Diagnosis not present

## 2023-09-05 DIAGNOSIS — M9902 Segmental and somatic dysfunction of thoracic region: Secondary | ICD-10-CM

## 2023-09-05 DIAGNOSIS — M9908 Segmental and somatic dysfunction of rib cage: Secondary | ICD-10-CM

## 2023-09-05 NOTE — Assessment & Plan Note (Signed)
Low back exam does have loss of lordosis  Tightness noted.  Will continue to work on the home exercises.  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  No change in medications.  Responding well to osteopathic manipulation but would like patient to follow-up again in 2 months for further evaluation and treatment.

## 2023-09-05 NOTE — Patient Instructions (Signed)
Good to see you! See you again in 6 weeks 

## 2023-10-17 NOTE — Progress Notes (Unsigned)
[name] [address] [place] [address] Phone: [phone] Subjective:   Lucas Young, am serving as a scribe for Dr. Antoine Primas.  I'm seeing this patient by the request  of:  Ardith Dark, MD  CC: back and neck pain follow up   YQI:[id number]  Lucas Young is a 67 y.o. male coming in with complaint of back and neck pain. OMT 09/05/2023. Patient states that he is doing well.   Medications patient has been prescribed: Meloxicam, Zanaflex  Taking:         Reviewed prior external information including notes and imaging from previsou exam, outside providers and external EMR if available.   As well as notes that were available from care everywhere and other healthcare systems.  Past medical history, social, surgical and family history all reviewed in electronic medical record.  No pertanent information unless stated regarding to the chief complaint.   Past Medical History:  Diagnosis Date   Allergy    Anemia    as a kid per patient   Anxiety    Arthritis    Back pain    Depression    Food allergy    Hyperlipidemia    Hypertension    "white coat syndrome" per patient   Lactose intolerance    Metabolic syndrome    per patient- seeing weight loss MD for this   Morton's neuroma    Obesity    Pneumonia    "walking pneumonia 16-17 yr ago"   Pre-diabetes    history of pre-DM- "out of zone" d/t weight loss   Sleep apnea    uses CPAP    Allergies  Allergen Reactions   Grass Pollen(K-O-R-T-Swt Vern) Other (See Comments)   Molds & Smuts Other (See Comments)   Pollen Extract    Thimerosal (Thiomersal) Itching    Eye redness      Review of Systems:  No headache, visual changes, nausea, vomiting, diarrhea, constipation, dizziness, abdominal pain, skin rash, fevers, chills, night sweats, weight loss, swollen lymph nodes, body aches, joint swelling, chest pain, shortness of breath, mood changes. POSITIVE  muscle aches  Objective  Blood pressure 124/86, pulse 73, height 5\' 6"  (1.676 m), SpO2 93%.   General: No apparent distress alert and oriented x3 mood and affect normal, dressed appropriately.  HEENT: Pupils equal, extraocular movements intact  Respiratory: Patient's speak in full sentences and does not appear short of breath  Cardiovascular: No lower extremity edema, non tender, no erythema  MSK:  Back does have some loss of lordosis noted.  He still has some room for improvement in core strength.  Osteopathic findings  C2 flexed rotated and side bent right C6 flexed rotated and side bent left T3 extended rotated and side bent right inhaled rib T9 extended rotated and side bent left L1 flexed rotated and side bent right L4 flexed rotated and side bent left Sacrum right on right     Assessment and Plan:  SI (sacroiliac) joint dysfunction Chronic with exacerbation noted.  Discussed icing regimen and home exercises.  Which activities to do and which ones to avoid.  We discussed avoiding certain activities.     Nonallopathic problems  Decision today to treat with OMT was based on Physical Exam  After verbal consent patient was treated with HVLA, ME, FPR techniques in cervical, rib, thoracic, lumbar, and sacral  areas  Patient tolerated the procedure well with improvement in symptoms  Patient given exercises, stretches and  lifestyle modifications  See medications in patient instructions if given  Patient will follow up in 4-8 weeks    The above documentation has been reviewed and is accurate and complete Judi Saa, DO          Note: This dictation was prepared with Dragon dictation along with smaller phrase technology. Any transcriptional errors that result from this process are unintentional.

## 2023-10-18 ENCOUNTER — Encounter: Payer: Self-pay | Admitting: Family Medicine

## 2023-10-18 ENCOUNTER — Ambulatory Visit: Payer: BC Managed Care – PPO | Admitting: Family Medicine

## 2023-10-18 VITALS — BP 124/86 | HR 73 | Ht 66.0 in

## 2023-10-18 DIAGNOSIS — M9902 Segmental and somatic dysfunction of thoracic region: Secondary | ICD-10-CM

## 2023-10-18 DIAGNOSIS — M9904 Segmental and somatic dysfunction of sacral region: Secondary | ICD-10-CM

## 2023-10-18 DIAGNOSIS — M9901 Segmental and somatic dysfunction of cervical region: Secondary | ICD-10-CM | POA: Diagnosis not present

## 2023-10-18 DIAGNOSIS — M533 Sacrococcygeal disorders, not elsewhere classified: Secondary | ICD-10-CM

## 2023-10-18 DIAGNOSIS — M9908 Segmental and somatic dysfunction of rib cage: Secondary | ICD-10-CM | POA: Diagnosis not present

## 2023-10-18 DIAGNOSIS — M9903 Segmental and somatic dysfunction of lumbar region: Secondary | ICD-10-CM | POA: Diagnosis not present

## 2023-10-18 NOTE — Assessment & Plan Note (Signed)
Chronic with exacerbation noted.  Discussed icing regimen and home exercises.  Which activities to do and which ones to avoid.  We discussed avoiding certain activities.

## 2023-10-18 NOTE — Patient Instructions (Signed)
Great to see you [email or web address] See me again in 8 weeks

## 2023-12-02 ENCOUNTER — Other Ambulatory Visit: Payer: Self-pay | Admitting: Internal Medicine

## 2023-12-05 ENCOUNTER — Ambulatory Visit: Payer: 59 | Admitting: Internal Medicine

## 2023-12-05 NOTE — Progress Notes (Deleted)
 Name: Lucas Young  MRN/ DOB: [id number], May 14, 1956    Age/ Sex: 68 y.o., male     PCP: Lucas Young HERO, MD   Reason for Endocrinology Evaluation: Hyperprolactinemia/hypogonadism     Initial Endocrinology Clinic Visit: 04/19/2021    PATIENT IDENTIFIER: [name] is a 68 y.o., male with a past medical history of prolactinoma. He has followed with [facility] since 04/19/2021 for consultative assistance with management of his hyperprolactinemia.   HISTORICAL SUMMARY: The patient was first diagnosed with hyperprolactinemia in May 2022 with a prolactin level of 117.5 NG/mL.  He was started on cabergoline  at the time   Of note the patient has also been noted with low testosterone  at the time with a nadir of 101 NG/dL in 02/7976 but this has normalized    Patient has 2 biological children  Pituitary MRI did not show any pituitary adenoma 04/2021  SUBJECTIVE:    Today (12/05/2023):  Mr. [name] is here for a follow-up on hyperprolactinemia and hypogonadism.   Denies headaches  Denies vision changes  Denies galactorrhea  Denies changes in energy level or changes in mood  Cabergoline  0.5 mg, 1 tabs  weekly ( Mondays and Friday)      HISTORY:  Past Medical History:  Past Medical History:  Diagnosis Date   Allergy    Anemia    as a kid per patient   Anxiety    Arthritis    Back pain    Depression    Food allergy    Hyperlipidemia    Hypertension    white coat syndrome per patient   Lactose intolerance    Metabolic syndrome    per patient- seeing weight loss MD for this   Morton's neuroma    Obesity    Pneumonia    walking pneumonia 16-17 yr ago   Pre-diabetes    history of pre-DM- out of zone d/t weight loss   Sleep apnea    uses CPAP   Past Surgical History:  Past Surgical History:  Procedure Laterality Date   APPENDECTOMY     COLONOSCOPY  2010   Dr.Medoff  normal per pt   DRUG INDUCED ENDOSCOPY N/A  02/01/2022   Procedure: DRUG INDUCED SPLEEP ENDOSCOPY;  Surgeon: Carlie Clark, MD;  Location: [facility];  Service: ENT;  Laterality: N/A;   HEMATOMA EVACUATION Right 04/28/2022   Procedure: EVACUATION HEMATOMA OF NECK;  Surgeon: Carlie Clark, MD;  Location: [facility] OR;  Service: ENT;  Laterality: Right;   IMPLANTATION OF HYPOGLOSSAL NERVE STIMULATOR Right 04/26/2022   Procedure: IMPLANTATION OF HYPOGLOSSAL NERVE STIMULATOR;  Surgeon: Carlie Clark, MD;  Location: [facility] OR;  Service: ENT;  Laterality: Right;   UMBILICAL HERNIA REPAIR     Social History:  reports that he has never smoked. He has never used smokeless tobacco. He reports that he does not drink alcohol and does not use drugs. Family History:  Family History  Problem Relation Age of Onset   Hypertension Mother    Sleep apnea Mother    Obesity Mother    Colon polyps Father    Hypertension Father    Hyperlipidemia Father    Heart disease Father    Sleep apnea Father    Obesity Father    Hypertension Maternal Grandmother    Hypertension Maternal Grandfather    Other Neg Hx        low testosterone    Colon cancer Neg Hx    Esophageal cancer Neg Hx  Stomach cancer Neg Hx    Rectal cancer Neg Hx      HOME MEDICATIONS: Allergies as of 12/05/2023       Reactions   Grass Pollen(k-o-r-t-swt Vern) Other (See Comments)   Molds & Smuts Other (See Comments)   Pollen Extract    Thimerosal (thiomersal) Itching   Eye redness         Medication List        Accurate as of December 05, 2023  6:47 AM. If you have any questions, ask your nurse or doctor.          cabergoline  0.5 MG tablet Commonly known as: DOSTINEX  Decrease Cabergoline  0.5 mg, 1 tab twice weekly (Monday and Friday)   desloratadine 5 MG tablet Commonly known as: CLARINEX Take 5 mg by mouth daily as needed (allergies).   EPINEPHrine  0.3 mg/0.3 mL Soaj injection Commonly known as: EPI-PEN Inject 0.3 mg into the muscle as needed for anaphylaxis.    fluticasone 50 MCG/ACT nasal spray Commonly known as: FLONASE Place 1 spray into both nostrils daily as needed for allergies.   meloxicam  15 MG tablet Commonly known as: MOBIC  Take 1 tablet (15 mg total) by mouth daily.   montelukast 10 MG tablet Commonly known as: SINGULAIR Take 10 mg by mouth daily as needed (allergies).   multivitamin capsule Take 1 capsule by mouth daily.   predniSONE  20 MG tablet Commonly known as: DELTASONE  Take 2 tablets (40 mg total) by mouth daily with breakfast.   SYSTANE OP Place 1 drop into both eyes daily as needed (dry eyes).   telmisartan  40 MG tablet Commonly known as: MICARDIS    tiZANidine  2 MG tablet Commonly known as: ZANAFLEX  Take 1 tablet (2 mg total) by mouth at bedtime.   VITAMIN C PO Take 1 tablet by mouth daily.   VITAMIN D  PO Take 1 capsule by mouth daily.   VITAMIN K PO Take 1 capsule by mouth daily.          OBJECTIVE:   PHYSICAL EXAM: VS: There were no vitals taken for this visit.   EXAM: General: Pt appears well and is in NAD  Lungs: Clear with good BS bilat   Heart: Auscultation: RRR.  Abdomen: Soft, nontender  Extremities:  BL LE: No pretibial edema   Mental Status: Judgment, insight: Intact Orientation: Oriented to time, place, and person Mood and affect: No depression, anxiety, or agitation     DATA REVIEWED:   Latest Reference Range & Units 07/26/23 07:55 07/26/23 08:09  Prolactin 2.0 - 18.0 ng/mL 5.4   Testosterone , Total, LC-MS-MS 250 - 1,100 ng/dL  419     MRI brain 3/80/7977   Dedicated pituitary protocol was performed. 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 The pituitary parenchyma enhances relatively homogeneously on dynamic protocol without convincing discrete pituitary lesion. The infundibulum is midline and unremarkable. No suprasellar extension of pituitary parenchyma. Unremarkable optic chiasm.    ASSESSMENT / PLAN /  RECOMMENDATIONS:   Prolactinoma   -He has responded well to cabergoline  -MRI in 2022 did not reveal any pituitary adenoma -We had reduced cabergoline  from 4 tablets a week to 3 tablets a week 10/2022, repeat prolactin May 2024 was stable and normal -I have recommended reducing cabergoline  as below   Medications   Cabergoline  0.5 mg, 1 tab twice  weekly ( Monday and Friday)     Follow-up in 6 months     Signed electronically by: Stefano Redgie Butts, MD  [facility]  [facility] [address] [place], [place] [address] Phone: [phone] FAX: [phone]      CC: Lucas Young HERO, MD [address] [place] [address] Phone: [phone]  Fax: [phone]   Return to Endocrinology clinic as below: Future Appointments  Date Time Provider Department Center  12/05/2023  7:50 AM Halcyon Heck, Donell Cardinal, MD [facility] None  12/13/2023  8:00 AM Claudene Arthea HERO, DO [facility] None  04/08/2024  8:00 AM Lucas Young HERO, MD [facility]

## 2023-12-06 ENCOUNTER — Ambulatory Visit: Payer: 59 | Admitting: Internal Medicine

## 2023-12-06 ENCOUNTER — Encounter: Payer: Self-pay | Admitting: Internal Medicine

## 2023-12-06 VITALS — BP 122/80 | HR 72 | Ht 66.0 in | Wt 210.0 lb

## 2023-12-06 DIAGNOSIS — D352 Benign neoplasm of pituitary gland: Secondary | ICD-10-CM

## 2023-12-06 NOTE — Progress Notes (Signed)
 Name: Lucas Young  MRN/ DOB: [id number], November 28, 1956    Age/ Sex: 68 y.o., male     PCP: Kennyth Worth HERO, MD   Reason for Endocrinology Evaluation: Hyperprolactinemia/hypogonadism     Initial Endocrinology Clinic Visit: 04/19/2021    PATIENT IDENTIFIER: Lucas Young is a 68 y.o., male with a past medical history of prolactinoma. He has followed with [facility] since 04/19/2021 for consultative assistance with management of his hyperprolactinemia.   HISTORICAL SUMMARY: The patient was first diagnosed with hyperprolactinemia in May 2022 with a prolactin level of 117.5 NG/mL.  He was started on cabergoline  at the time   Of note the patient has also been noted with low testosterone  at the time with a nadir of 101 NG/dL in 02/7976 but this has normalized    Patient has 2 biological children  Pituitary MRI did not show any pituitary adenoma 04/2021  SUBJECTIVE:    Today (12/06/2023):  Lucas Young is here for a follow-up on hyperprolactinemia and hypogonadism.   Denies headaches  Denies vision changes  Denies galactorrhea  Denies constipation or diarrhea  Denies nausea or vomiting   Cabergoline  0.5 mg, 1 tabs  weekly ( Mondays and Friday)      HISTORY:  Past Medical History:  Past Medical History:  Diagnosis Date   Allergy    Anemia    as a kid per patient   Anxiety    Arthritis    Back pain    Depression    Food allergy    Hyperlipidemia    Hypertension    white coat syndrome per patient   Lactose intolerance    Metabolic syndrome    per patient- seeing weight loss MD for this   Morton's neuroma    Obesity    Pneumonia    walking pneumonia 16-17 yr ago   Pre-diabetes    history of pre-DM- out of zone d/t weight loss   Sleep apnea    uses CPAP   Past Surgical History:  Past Surgical History:  Procedure Laterality Date   APPENDECTOMY     COLONOSCOPY  2010   Dr.Medoff  normal per pt   DRUG INDUCED  ENDOSCOPY N/A 02/01/2022   Procedure: DRUG INDUCED SPLEEP ENDOSCOPY;  Surgeon: Carlie Clark, MD;  Location: [facility];  Service: ENT;  Laterality: N/A;   HEMATOMA EVACUATION Right 04/28/2022   Procedure: EVACUATION HEMATOMA OF NECK;  Surgeon: Carlie Clark, MD;  Location: [facility] OR;  Service: ENT;  Laterality: Right;   IMPLANTATION OF HYPOGLOSSAL NERVE STIMULATOR Right 04/26/2022   Procedure: IMPLANTATION OF HYPOGLOSSAL NERVE STIMULATOR;  Surgeon: Carlie Clark, MD;  Location: [facility] OR;  Service: ENT;  Laterality: Right;   UMBILICAL HERNIA REPAIR     Social History:  reports that he has never smoked. He has never used smokeless tobacco. He reports that he does not drink alcohol and does not use drugs. Family History:  Family History  Problem Relation Age of Onset   Hypertension Mother    Sleep apnea Mother    Obesity Mother    Colon polyps Father    Hypertension Father    Hyperlipidemia Father    Heart disease Father    Sleep apnea Father    Obesity Father    Hypertension Maternal Grandmother    Hypertension Maternal Grandfather    Other Neg Hx        low testosterone    Colon cancer Neg Hx    Esophageal cancer Neg  Hx    Stomach cancer Neg Hx    Rectal cancer Neg Hx      HOME MEDICATIONS: Allergies as of 12/06/2023       Reactions   Grass Pollen(k-o-r-t-swt Vern) Other (See Comments)   Molds & Smuts Other (See Comments)   Pollen Extract    Thimerosal (thiomersal) Itching   Eye redness         Medication List        Accurate as of December 06, 2023  7:42 AM. If you have any questions, ask your nurse or doctor.          cabergoline  0.5 MG tablet Commonly known as: DOSTINEX  Decrease Cabergoline  0.5 mg, 1 tab twice weekly (Monday and Friday)   desloratadine 5 MG tablet Commonly known as: CLARINEX Take 5 mg by mouth daily as needed (allergies).   EPINEPHrine  0.3 mg/0.3 mL Soaj injection Commonly known as: EPI-PEN Inject 0.3 mg into the muscle as needed for  anaphylaxis.   fluticasone 50 MCG/ACT nasal spray Commonly known as: FLONASE Place 1 spray into both nostrils daily as needed for allergies.   meloxicam  15 MG tablet Commonly known as: MOBIC  Take 1 tablet (15 mg total) by mouth daily.   montelukast 10 MG tablet Commonly known as: SINGULAIR Take 10 mg by mouth daily as needed (allergies).   multivitamin capsule Take 1 capsule by mouth daily.   predniSONE  20 MG tablet Commonly known as: DELTASONE  Take 2 tablets (40 mg total) by mouth daily with breakfast.   SYSTANE OP Place 1 drop into both eyes daily as needed (dry eyes).   telmisartan  40 MG tablet Commonly known as: MICARDIS    telmisartan -hydrochlorothiazide 40-12.5 MG tablet Commonly known as: MICARDIS  HCT Take 1 tablet by mouth daily.   tiZANidine  2 MG tablet Commonly known as: ZANAFLEX  Take 1 tablet (2 mg total) by mouth at bedtime.   VITAMIN C PO Take 1 tablet by mouth daily.   VITAMIN D  PO Take 1 capsule by mouth daily.   VITAMIN K PO Take 1 capsule by mouth daily.          OBJECTIVE:   PHYSICAL EXAM: VS: BP 122/80 (BP Location: Left Arm, Patient Position: Sitting, Cuff Size: Normal)   Pulse 72   Ht 5' 6 (1.676 m)   Wt 210 lb (95.3 kg) Comment: patient reported  SpO2 98%   BMI 33.89 kg/m    EXAM: General: Pt appears well and is in NAD  Lungs: Clear with good BS bilat   Heart: Auscultation: RRR.  Abdomen: Soft, nontender  Extremities:  BL LE: No pretibial edema   Mental Status: Judgment, insight: Intact Orientation: Oriented to time, place, and person Mood and affect: No depression, anxiety, or agitation     DATA REVIEWED:   Latest Reference Range & Units 12/06/23 08:07  Prolactin 2.0 - 18.0 ng/mL 5.0  TSH 0.40 - 4.50 mIU/L 4.01  T4,Free(Direct) 0.8 - 1.8 ng/dL 1.0      Latest Reference Range & Units 07/26/23 07:55 07/26/23 08:09  Prolactin 2.0 - 18.0 ng/mL 5.4   Testosterone , Total, LC-MS-MS 250 - 1,100 ng/dL  419     MRI  brain 3/80/7977   Dedicated pituitary protocol was performed. 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 The pituitary parenchyma enhances relatively homogeneously on dynamic protocol without convincing discrete pituitary lesion. The infundibulum is midline and unremarkable. No suprasellar extension of pituitary parenchyma. Unremarkable optic chiasm.    ASSESSMENT / PLAN /  RECOMMENDATIONS:   Prolactinoma   -He has responded well to cabergoline  -MRI in 2022 did not reveal any pituitary adenoma -Repeat prolactin level continues to be normal and trending down, will decrease cabergoline  as below -TFTs normal   Medications   Decrease cabergoline  0.5 mg,HALF a  tab twice  weekly ( Monday and Friday)    Follow-up in 6 months    Signed electronically by: Stefano Redgie Butts, MD  [facility]  [facility] [address] [place] [place], [place] [address] Phone: [phone] FAX: [phone]      CC: Kennyth Worth HERO, MD [address] [place] [address] Phone: [phone]  Fax: [phone]   Return to Endocrinology clinic as below: Future Appointments  Date Time Provider Department Center  12/13/2023  8:00 AM Claudene Arthea HERO, DO [facility] None  04/08/2024  8:00 AM Kennyth Worth HERO, MD [facility]

## 2023-12-07 LAB — TSH: TSH: 4.01 m[IU]/L (ref 0.40–4.50)

## 2023-12-07 LAB — PROLACTIN: Prolactin: 5 ng/mL (ref 2.0–18.0)

## 2023-12-07 LAB — T4, FREE: Free T4: 1 ng/dL (ref 0.8–1.8)

## 2023-12-07 MED ORDER — CABERGOLINE 0.5 MG PO TABS: 0.2500 mg | ORAL_TABLET | ORAL | 3 refills | Status: DC

## 2023-12-11 NOTE — Progress Notes (Signed)
 [facility] [address] [place] [address] Phone: [phone] Subjective:   Lucas Young, am serving as a scribe for Dr. Ronnell Coins.  I'm seeing this patient by the request  of:  Rodney Clamp, MD  CC: Back and neck pain follow-up  [id number]  Lucas Young is a 68 y.o. male coming in with complaint of back and neck pain. OMT 10/18/2023. Patient states that he is doing well.   Wanted to ask about R lateral epicondyle pain from 16 years ago when he injured it while fencing. Went bowling over Christmas and is now having increase in pain over lateral epi.   Medications patient has been prescribed: Meloxicam , Zanaflex   Taking:         Reviewed prior external information including notes and imaging from previsou exam, outside providers and external EMR if available.   As well as notes that were available from care everywhere and other healthcare systems.  Past medical history, social, surgical and family history all reviewed in electronic medical record.  No pertanent information unless stated regarding to the chief complaint.   Past Medical History:  Diagnosis Date   Allergy    Anemia    as a kid per patient   Anxiety    Arthritis    Back pain    Depression    Food allergy    Hyperlipidemia    Hypertension    "white coat syndrome" per patient   Lactose intolerance    Metabolic syndrome    per patient- seeing weight loss MD for this   Morton's neuroma    Obesity    Pneumonia    "walking pneumonia 16-17 yr ago"   Pre-diabetes    history of pre-DM- "out of zone" d/t weight loss   Sleep apnea    uses CPAP    Allergies  Allergen Reactions   Grass Pollen(K-O-R-T-Swt Vern) Other (See Comments)   Molds & Smuts Other (See Comments)   Pollen Extract    Thimerosal (Thiomersal) Itching    Eye redness      Review of Systems:  No headache, visual changes, nausea, vomiting, diarrhea, constipation,  dizziness, abdominal pain, skin rash, fevers, chills, night sweats, weight loss, swollen lymph nodes, body aches, joint swelling, chest pain, shortness of breath, mood changes. POSITIVE muscle aches  Objective  Blood pressure 110/82, pulse 74, height 5\' 6"  (1.676 m), SpO2 96%.   General: No apparent distress alert and oriented x3 mood and affect normal, dressed appropriately.  HEENT: Pupils equal, extraocular movements intact  Respiratory: Patient's speak in full sentences and does not appear short of breath  Cardiovascular: No lower extremity edema, non tender, no erythema  Gait MSK:  Back does have some loss of lordosis patient does in the neck have some loss of lordosis as well.  Some tenderness to palpation in the paraspinal musculature.  Mild crepitus noted.  Osteopathic findings  C2 flexed rotated and side bent right C7 flexed rotated and side bent right T3 extended rotated and side bent right inhaled rib T9 extended rotated and side bent left L2 flexed rotated and side bent right Sacrum right on right       Assessment and Plan:  Right lateral epicondylitis Elbow anatomy was reviewed, and tendinopathy was explained.  Pt. given a home rehab program. Start with isometrics and ROM, then a series of concentric and eccentric exercises should be done starting with no weight, work up to 1 lb, hammer, etc.  Use counterforce strap if working or using hands.  Formal PT would be beneficial. Emphasized stretching an cross-friction massage Emphasized proper palms up lifting biomechanics to unload ECRB Discussed with patient about overhead lifting.  Exacerbation of the chronic problem.  Follow-up again in 6 to 8 weeks otherwise.  Worsening pain consider formal physical therapy  Degenerative disc disease, cervical Chronic problem with exacerbation.  Discussed which activities to do and which ones to avoid.  Increase activity slowly over the course of next several weeks.  Discussed  icing regimen.  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  Do feel that there is little exacerbation secondary to potentially weather in no longer working out quite as regularly secondary to the holidays.  Follow-up again in 6 to 8 weeks    Nonallopathic problems  Decision today to treat with OMT was based on Physical Exam  After verbal consent patient was treated with HVLA, ME, FPR techniques in cervical, rib, thoracic, lumbar, and sacral  areas  Patient tolerated the procedure well with improvement in symptoms  Patient given exercises, stretches and lifestyle modifications  See medications in patient instructions if given  Patient will follow up in 4-8 weeks     The above documentation has been reviewed and is accurate and complete Lyric Hoar M Anyiah Coverdale, DO         Note: This dictation was prepared with Dragon dictation along with smaller phrase technology. Any transcriptional errors that result from this process are unintentional.

## 2023-12-13 ENCOUNTER — Encounter: Payer: Self-pay | Admitting: Family Medicine

## 2023-12-13 ENCOUNTER — Ambulatory Visit: Payer: 59 | Admitting: Family Medicine

## 2023-12-13 VITALS — BP 110/82 | HR 74 | Ht 66.0 in

## 2023-12-13 DIAGNOSIS — M9904 Segmental and somatic dysfunction of sacral region: Secondary | ICD-10-CM | POA: Diagnosis not present

## 2023-12-13 DIAGNOSIS — M9902 Segmental and somatic dysfunction of thoracic region: Secondary | ICD-10-CM

## 2023-12-13 DIAGNOSIS — M9903 Segmental and somatic dysfunction of lumbar region: Secondary | ICD-10-CM

## 2023-12-13 DIAGNOSIS — M9901 Segmental and somatic dysfunction of cervical region: Secondary | ICD-10-CM

## 2023-12-13 DIAGNOSIS — M5030 Other cervical disc degeneration, unspecified cervical region: Secondary | ICD-10-CM | POA: Diagnosis not present

## 2023-12-13 DIAGNOSIS — M7711 Lateral epicondylitis, right elbow: Secondary | ICD-10-CM

## 2023-12-13 DIAGNOSIS — M9908 Segmental and somatic dysfunction of rib cage: Secondary | ICD-10-CM

## 2023-12-13 NOTE — Assessment & Plan Note (Signed)
 Elbow anatomy was reviewed, and tendinopathy was explained.  Pt. given a home rehab program. Start with isometrics and ROM, then a series of concentric and eccentric exercises should be done starting with no weight, work up to 1 lb, hammer, etc.  Use counterforce strap if working or using hands.  Formal PT would be beneficial. Emphasized stretching an cross-friction massage Emphasized proper palms up lifting biomechanics to unload ECRB Discussed with patient about overhead lifting.  Exacerbation of the chronic problem.  Follow-up again in 6 to 8 weeks otherwise.  Worsening pain consider formal physical therapy

## 2023-12-13 NOTE — Patient Instructions (Signed)
 Avoid overhand lifting See me again in 6-8 weeks

## 2023-12-13 NOTE — Assessment & Plan Note (Signed)
 Chronic problem with exacerbation.  Discussed which activities to do and which ones to avoid.  Increase activity slowly over the course of next several weeks.  Discussed icing regimen.  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  Do feel that there is little exacerbation secondary to potentially weather in no longer working out quite as regularly secondary to the holidays.  Follow-up again in 6 to 8 weeks

## 2024-01-03 ENCOUNTER — Telehealth: Payer: Self-pay | Admitting: *Deleted

## 2024-01-03 NOTE — Telephone Encounter (Signed)
 Copied from CRM [id number]. Topic: Clinical - Medical Advice >> Jan 03, 2024 11:40 AM Lucas Young wrote: Reason for CRM: Patient states his wife had COVID last week, he tested negative & then retested positive, he would like to speak with Young nurse for the next best options.   Please schedule visit with any available provider

## 2024-01-04 ENCOUNTER — Telehealth: Payer: 59 | Admitting: Family Medicine

## 2024-01-04 ENCOUNTER — Other Ambulatory Visit (HOSPITAL_COMMUNITY): Payer: Self-pay

## 2024-01-04 DIAGNOSIS — U071 COVID-19: Secondary | ICD-10-CM | POA: Diagnosis not present

## 2024-01-04 DIAGNOSIS — J309 Allergic rhinitis, unspecified: Secondary | ICD-10-CM

## 2024-01-04 MED ORDER — AZELASTINE HCL 0.1 % NA SOLN: 2.0000 | Freq: Two times a day (BID) | NASAL | 12 refills | Status: AC

## 2024-01-04 MED ORDER — BENZONATATE 200 MG PO CAPS: 200.0000 mg | ORAL_CAPSULE | Freq: Two times a day (BID) | ORAL | 0 refills | Status: DC | PRN

## 2024-01-04 MED ORDER — NIRMATRELVIR/RITONAVIR (PAXLOVID)TABLET
3.0000 | ORAL_TABLET | Freq: Two times a day (BID) | ORAL | 0 refills | Status: AC
  Filled 2024-01-04: qty 30, 5d supply, fill #0

## 2024-01-04 MED ORDER — DICLOFENAC SODIUM 75 MG PO TBEC: 75.0000 mg | DELAYED_RELEASE_TABLET | Freq: Two times a day (BID) | ORAL | 0 refills | Status: AC

## 2024-01-04 NOTE — Progress Notes (Signed)
   Lucas Young is a 68 y.o. male who presents today for a virtual office visit.  Assessment/Plan:  New/Acute Problems: COVID No red flags.  Discussed limitations of virtual visit and inability to perform physical exam.  His home COVID test was negative.  Overall he is low risk for complication from COVID however he does desire more aggressive treatment to help him get back to work sooner.  We will start paxlovid .  Also start Tessalon  and Astelin .  Will send in a small supply of diclofenac  to help with his sore throat myalgias as well.  We encouraged hydration.  He can continue over-the-counter meds as needed as well.  He will let us  know if not proving.  Chronic Problems Addressed Today: Allergic rhinitis Follow-up with allergist.  He is currently on Flonase, Singulair, and antihistamines.  Will add on Astelin  as above.       Subjective:  HPI:  Patient here with sore throat, congestion, and headache. His symptoms started a couple of days ago. Wife was sick with COVID last week. Initially tested COVID negative but had positive test a few days afterwards. No fever. Sore throat is severe. Some myalgias. He has tried taking robutussin which helps some.        Objective/Observations  Physical Exam: Gen: NAD, resting comfortably Pulm: Normal work of breathing Neuro: Grossly normal, moves all extremities Psych: Normal affect and thought content  Virtual Visit via Video   I connected with Lucas Young on 01/04/24 at 11:20 AM EST by a video enabled telemedicine application and verified that I am speaking with the correct person using two identifiers. The limitations of evaluation and management by telemedicine and the availability of in person appointments were discussed. The patient expressed understanding and agreed to proceed.   Patient location: Home Provider location: [facility] [employer] [facility] Persons participating in the virtual visit: Myself and  Patient     Worth HERO. Kennyth, MD 01/04/2024 12:18 PM

## 2024-01-04 NOTE — Assessment & Plan Note (Signed)
 Follow-up with allergist.  He is currently on Flonase, Singulair, and antihistamines.  Will add on Astelin  as above.

## 2024-01-05 ENCOUNTER — Other Ambulatory Visit (HOSPITAL_COMMUNITY): Payer: Self-pay

## 2024-01-24 NOTE — Progress Notes (Unsigned)
 [name] [address] [place] [address] Phone: [phone] Subjective:   Lucas Young, am serving as a scribe for Dr. Antoine Primas.  I'm seeing this patient by the request  of:  Ardith Dark, MD  CC: Back and neck pain follow-up  [id number]  Lucas Young is a 68 y.o. male coming in with complaint of back and neck pain. OMT 12/13/2023. Patient states that his back is "off".   Also c/o L foot pain over top of foot and lateral aspect for past 2 weeks. Painful to walk.   Medications patient has been prescribed: Meloxicam, Zanaflex  Taking:         Reviewed prior external information including notes and imaging from previsou exam, outside providers and external EMR if available.   As well as notes that were available from care everywhere and other healthcare systems.  Past medical history, social, surgical and family history all reviewed in electronic medical record.  No pertanent information unless stated regarding to the chief complaint.   Past Medical History:  Diagnosis Date   Allergy    Anemia    as a kid per patient   Anxiety    Arthritis    Back pain    Depression    Food allergy    Hyperlipidemia    Hypertension    "white coat syndrome" per patient   Lactose intolerance    Metabolic syndrome    per patient- seeing weight loss MD for this   Morton's neuroma    Obesity    Pneumonia    "walking pneumonia 16-17 yr ago"   Pre-diabetes    history of pre-DM- "out of zone" d/t weight loss   Sleep apnea    uses CPAP    Allergies  Allergen Reactions   Grass Pollen(K-O-R-T-Swt Vern) Other (See Comments)   Molds & Smuts Other (See Comments)   Pollen Extract    Thimerosal (Thiomersal) Itching    Eye redness      Review of Systems:  No headache, visual changes, nausea, vomiting, diarrhea, constipation, dizziness, abdominal pain, skin rash, fevers, chills, night sweats, weight loss, swollen lymph  nodes, body aches, joint swelling, chest pain, shortness of breath, mood changes. POSITIVE muscle aches  Objective  Blood pressure 132/88, pulse 65, height 5\' 6"  (1.676 m), SpO2 97%.   General: No apparent distress alert and oriented x3 mood and affect normal, dressed appropriately.  HEENT: Pupils equal, extraocular movements intact  Respiratory: Patient's speak in full sentences and does not appear short of breath  Cardiovascular: No lower extremity edema, non tender, no erythema  Gait relatively normal MSK:  Back patient does have loss of lordosis noted.  Poor core strength still noted.  Patient does seem to be tighter than usual.  Left ankle does have tightness noted to the peroneal tendons.  The patient does have some audible popping noted.  Seems to be a little tender in the fourth and fifth insertion areas.  No bony abnormality noted.  Osteopathic findings  C2 flexed rotated and side bent right C5 flexed rotated and side bent left T3 extended rotated and side bent right inhaled rib T9 extended rotated and side bent left L2 flexed rotated and side bent right Sacrum right on right       Assessment and Plan:  SI (sacroiliac) joint dysfunction Chronic, with exacerbation secondary to patient recently having COVID, do think patient is dehydrated overall.  Discussed with regimen of home exercises,  which activities to do and which ones to avoid.  Increase activity slowly otherwise.  Follow-up again in 6 to 8 weeks.  Peroneal tendinitis of left lower extremity Peroneal tendinitis noted.  Discussed icing regimen and home exercises, discussed heel lift symptoms regular shoes for short-term.  Discussed icing regimen.  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  Follow-up again in 6 to 8 weeks.    Nonallopathic problems  Decision today to treat with OMT was based on Physical Exam  After verbal consent patient was treated with HVLA, ME, FPR techniques in cervical, rib,  thoracic, lumbar, and sacral  areas  Patient tolerated the procedure well with improvement in symptoms  Patient given exercises, stretches and lifestyle modifications  See medications in patient instructions if given  Patient will follow up in 4-8 weeks    The above documentation has been reviewed and is accurate and complete Judi Saa, DO          Note: This dictation was prepared with Dragon dictation along with smaller phrase technology. Any transcriptional errors that result from this process are unintentional.

## 2024-01-25 ENCOUNTER — Ambulatory Visit: Payer: 59 | Admitting: Family Medicine

## 2024-01-25 ENCOUNTER — Encounter: Payer: Self-pay | Admitting: Family Medicine

## 2024-01-25 VITALS — BP 132/88 | HR 65 | Ht 66.0 in

## 2024-01-25 DIAGNOSIS — M9904 Segmental and somatic dysfunction of sacral region: Secondary | ICD-10-CM

## 2024-01-25 DIAGNOSIS — M7672 Peroneal tendinitis, left leg: Secondary | ICD-10-CM

## 2024-01-25 DIAGNOSIS — M9902 Segmental and somatic dysfunction of thoracic region: Secondary | ICD-10-CM

## 2024-01-25 DIAGNOSIS — M9901 Segmental and somatic dysfunction of cervical region: Secondary | ICD-10-CM | POA: Diagnosis not present

## 2024-01-25 DIAGNOSIS — M9903 Segmental and somatic dysfunction of lumbar region: Secondary | ICD-10-CM

## 2024-01-25 DIAGNOSIS — M533 Sacrococcygeal disorders, not elsewhere classified: Secondary | ICD-10-CM | POA: Diagnosis not present

## 2024-01-25 DIAGNOSIS — M9908 Segmental and somatic dysfunction of rib cage: Secondary | ICD-10-CM

## 2024-01-25 NOTE — Assessment & Plan Note (Signed)
 Chronic, with exacerbation secondary to patient recently having COVID, do think patient is dehydrated overall.  Discussed with regimen of home exercises, which activities to do and which ones to avoid.  Increase activity slowly otherwise.  Follow-up again in 6 to 8 weeks.

## 2024-01-25 NOTE — Assessment & Plan Note (Signed)
 Peroneal tendinitis noted.  Discussed icing regimen and home exercises, discussed heel lift symptoms regular shoes for short-term.  Discussed icing regimen.  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  Follow-up again in 6 to 8 weeks.

## 2024-01-25 NOTE — Patient Instructions (Signed)
 COVID took your flow Add electrolytes to fluid intake Add heel lifts to Ultras See me in 6-8 weeks

## 2024-03-05 NOTE — Progress Notes (Unsigned)
 [name] [address] [place] [address] Phone: [phone] Subjective:   Lucas Young, am serving as a scribe for Dr. Antoine Primas.  I'm seeing this patient by the request  of:  Ardith Dark, MD  CC: back and neck pain follow up   [id number]  Lucas Young is a 68 y.o. male coming in with complaint of back and neck pain. OMT 01/25/2024. Patient states that his neck has been tight for past 3 days. Lumbar spine is doing well.   Medications patient has been prescribed: None  Taking:         Reviewed prior external information including notes and imaging from previsou exam, outside providers and external EMR if available.   As well as notes that were available from care everywhere and other healthcare systems.  Past medical history, social, surgical and family history all reviewed in electronic medical record.  No pertanent information unless stated regarding to the chief complaint.   Past Medical History:  Diagnosis Date   Allergy    Anemia    as a kid per patient   Anxiety    Arthritis    Back pain    Depression    Food allergy    Hyperlipidemia    Hypertension    "white coat syndrome" per patient   Lactose intolerance    Metabolic syndrome    per patient- seeing weight loss MD for this   Morton's neuroma    Obesity    Pneumonia    "walking pneumonia 16-17 yr ago"   Pre-diabetes    history of pre-DM- "out of zone" d/t weight loss   Sleep apnea    uses CPAP    Allergies  Allergen Reactions   Grass Pollen(K-O-R-T-Swt Vern) Other (See Comments)   Molds & Smuts Other (See Comments)   Pollen Extract    Thimerosal (Thiomersal) Itching    Eye redness      Review of Systems:  No headache, visual changes, nausea, vomiting, diarrhea, constipation, dizziness, abdominal pain, skin rash, fevers, chills, night sweats, weight loss, swollen lymph nodes, body aches, joint swelling, chest pain, shortness of  breath, mood changes. POSITIVE muscle aches  Objective  There were no vitals taken for this visit.   General: No apparent distress alert and oriented x3 mood and affect normal, dressed appropriately.  HEENT: Pupils equal, extraocular movements intact  Respiratory: Patient's speak in full sentences and does not appear short of breath  Cardiovascular: No lower extremity edema, non tender, no erythema  Gait MSK:  Back does have some loss lordosis noted.  Some tightness noted with Pearlean Brownie right greater than left.  Neck exam severely tender to palpation more on the right than the left.  Limited sidebending noted.  Osteopathic findings  C2 flexed rotated and side bent right C7 flexed rotated and side bent right T3 extended rotated and side bent right inhaled rib L3 flexed rotated and side bent right L4 flexed rotated and side bent left Sacrum right on right     Assessment and Plan:  No problem-specific Assessment & Plan notes found for this encounter.    Nonallopathic problems  Decision today to treat with OMT was based on Physical Exam  After verbal consent patient was treated with HVLA, ME, FPR techniques in cervical, rib, thoracic, lumbar, and sacral  areas  Patient tolerated the procedure well with improvement in symptoms  Patient given exercises, stretches and lifestyle modifications  See medications in patient  instructions if given  Patient will follow up in 7-8 weeks    The above documentation has been reviewed and is accurate and complete Judi Saa, DO          Note: This dictation was prepared with Dragon dictation along with smaller phrase technology. Any transcriptional errors that result from this process are unintentional.

## 2024-03-06 ENCOUNTER — Ambulatory Visit: Payer: 59 | Admitting: Family Medicine

## 2024-03-06 ENCOUNTER — Encounter: Payer: Self-pay | Admitting: Family Medicine

## 2024-03-06 VITALS — BP 112/78 | Ht 66.0 in

## 2024-03-06 DIAGNOSIS — M9904 Segmental and somatic dysfunction of sacral region: Secondary | ICD-10-CM

## 2024-03-06 DIAGNOSIS — M9901 Segmental and somatic dysfunction of cervical region: Secondary | ICD-10-CM

## 2024-03-06 DIAGNOSIS — M5030 Other cervical disc degeneration, unspecified cervical region: Secondary | ICD-10-CM

## 2024-03-06 DIAGNOSIS — M9908 Segmental and somatic dysfunction of rib cage: Secondary | ICD-10-CM

## 2024-03-06 DIAGNOSIS — M9902 Segmental and somatic dysfunction of thoracic region: Secondary | ICD-10-CM | POA: Diagnosis not present

## 2024-03-06 DIAGNOSIS — M9903 Segmental and somatic dysfunction of lumbar region: Secondary | ICD-10-CM | POA: Diagnosis not present

## 2024-03-06 NOTE — Assessment & Plan Note (Signed)
 Exacerbation noted.  Responded extremely well though to osteopathic manipulation today.  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  May have been doing some more around the house secondary to his wife being injured but she is doing much better at this time.

## 2024-03-06 NOTE — Patient Instructions (Signed)
 Thanks for confirming feelings are normal about the world See me in 7-8 weeks

## 2024-04-05 ENCOUNTER — Encounter: Payer: BC Managed Care – PPO | Admitting: Family Medicine

## 2024-04-08 ENCOUNTER — Encounter: Payer: BC Managed Care – PPO | Admitting: Family Medicine

## 2024-04-11 ENCOUNTER — Other Ambulatory Visit: Payer: Self-pay | Admitting: Family Medicine

## 2024-04-11 DIAGNOSIS — E65 Localized adiposity: Secondary | ICD-10-CM

## 2024-04-16 ENCOUNTER — Other Ambulatory Visit

## 2024-04-23 ENCOUNTER — Ambulatory Visit
Admission: RE | Admit: 2024-04-23 | Discharge: 2024-04-23 | Disposition: A | Discharge status: Home / Self Care | Source: Ambulatory Visit | Attending: Family Medicine | Admitting: Family Medicine

## 2024-04-23 DIAGNOSIS — E65 Localized adiposity: Secondary | ICD-10-CM

## 2024-04-23 NOTE — Progress Notes (Unsigned)
 [facility] [address] [place] [address] Phone: [phone] Subjective:   Lucas Young, am serving as a scribe for Dr. Ronnell Coins.  I'm seeing this patient by the request  of:  Rodney Clamp, MD  CC: Back and neck pain follow-up  [id number]  Lucas Young is a 68 y.o. male coming in with complaint of back and neck pain. OMT 03/06/2024. Patient states that he is stiff in cervical spine.   Medications patient has been prescribed: None          Reviewed prior external information including notes and imaging from previsou exam, outside providers and external EMR if available.   As well as notes that were available from care everywhere and other healthcare systems.  Past medical history, social, surgical and family history all reviewed in electronic medical record.  No pertanent information unless stated regarding to the chief complaint.   Past Medical History:  Diagnosis Date   Allergy    Anemia    as a kid per patient   Anxiety    Arthritis    Back pain    Depression    Food allergy    Hyperlipidemia    Hypertension    "white coat syndrome" per patient   Lactose intolerance    Metabolic syndrome    per patient- seeing weight loss MD for this   Morton's neuroma    Obesity    Pneumonia    "walking pneumonia 16-17 yr ago"   Pre-diabetes    history of pre-DM- "out of zone" d/t weight loss   Sleep apnea    uses CPAP    Allergies  Allergen Reactions   Grass Pollen(K-O-R-T-Swt Vern) Other (See Comments)   Molds & Smuts Other (See Comments)   Pollen Extract    Thimerosal (Thiomersal) Itching    Eye redness      Review of Systems:  No headache, visual changes, nausea, vomiting, diarrhea, constipation, dizziness, abdominal pain, skin rash, fevers, chills, night sweats, weight loss, swollen lymph nodes, body aches, joint swelling, chest pain, shortness of breath, mood changes. POSITIVE muscle  aches  Objective  Blood pressure (!) 132/90, pulse 62, height 5\' 6"  (1.676 m), SpO2 96%.   General: No apparent distress alert and oriented x3 mood and affect normal, dressed appropriately.  HEENT: Pupils equal, extraocular movements intact  Respiratory: Patient's speak in full sentences and does not appear short of breath  Cardiovascular: No lower extremity edema, non tender, no erythema  Gait MSK:  Back does have some loss lordosis noted.  Some tenderness to palpation noted.  Tightness with Veldon German right greater than left.  Neck exam shows some shotty lymph nodes, patient does have a cough with laying down flat.  No significant swelling of the extremities.  Osteopathic findings  C2 flexed rotated and side bent right C6 flexed rotated and side bent left T3 extended rotated and side bent right inhaled rib T9 extended rotated and side bent left L2 flexed rotated and side bent right Sacrum right on right     Assessment and Plan:  Degenerative disc disease, cervical Chronic, with mild exacerbation secondary to recent cough.  We discussed with patient about if cough continues we can consider x-ray.  Discussed icing regimen of home exercises, which activities to do and which ones to avoid.  Increase activity slowly.  Discussed icing regimen.  Follow-up again in 6 to 8 weeks.  SI (sacroiliac) joint dysfunction Continue to work on the  core strength.  Discussed icing regimen and home exercises.  Discussed which activities to do and which ones to avoid.  Increase activity slowly.  Follow-up again in 6 to 8 weeks.    Nonallopathic problems  Decision today to treat with OMT was based on Physical Exam  After verbal consent patient was treated with HVLA, ME, FPR techniques in cervical, rib, thoracic, lumbar, and sacral  areas  Patient tolerated the procedure well with improvement in symptoms  Patient given exercises, stretches and lifestyle modifications  See medications in patient  instructions if given  Patient will follow up in 4-8 weeks    The above documentation has been reviewed and is accurate and complete Jaziya Obarr M Artesia Berkey, DO          Note: This dictation was prepared with Dragon dictation along with smaller phrase technology. Any transcriptional errors that result from this process are unintentional.

## 2024-04-25 ENCOUNTER — Encounter: Payer: Self-pay | Admitting: Family Medicine

## 2024-04-25 ENCOUNTER — Ambulatory Visit: Admitting: Family Medicine

## 2024-04-25 VITALS — BP 132/90 | HR 62 | Ht 66.0 in

## 2024-04-25 DIAGNOSIS — M533 Sacrococcygeal disorders, not elsewhere classified: Secondary | ICD-10-CM | POA: Diagnosis not present

## 2024-04-25 DIAGNOSIS — M5030 Other cervical disc degeneration, unspecified cervical region: Secondary | ICD-10-CM | POA: Diagnosis not present

## 2024-04-25 DIAGNOSIS — M9902 Segmental and somatic dysfunction of thoracic region: Secondary | ICD-10-CM

## 2024-04-25 DIAGNOSIS — M9901 Segmental and somatic dysfunction of cervical region: Secondary | ICD-10-CM

## 2024-04-25 DIAGNOSIS — M9904 Segmental and somatic dysfunction of sacral region: Secondary | ICD-10-CM | POA: Diagnosis not present

## 2024-04-25 DIAGNOSIS — M9903 Segmental and somatic dysfunction of lumbar region: Secondary | ICD-10-CM | POA: Diagnosis not present

## 2024-04-25 DIAGNOSIS — M9908 Segmental and somatic dysfunction of rib cage: Secondary | ICD-10-CM

## 2024-04-25 NOTE — Assessment & Plan Note (Signed)
 Chronic, with mild exacerbation secondary to recent cough.  We discussed with patient about if cough continues we can consider x-ray.  Discussed icing regimen of home exercises, which activities to do and which ones to avoid.  Increase activity slowly.  Discussed icing regimen.  Follow-up again in 6 to 8 weeks.

## 2024-04-25 NOTE — Patient Instructions (Signed)
See me in 7-8 weeks 

## 2024-04-25 NOTE — Assessment & Plan Note (Signed)
 Continue to work on the core strength.  Discussed icing regimen and home exercises.  Discussed which activities to do and which ones to avoid.  Increase activity slowly.  Follow-up again in 6 to 8 weeks.

## 2024-06-04 ENCOUNTER — Ambulatory Visit: Payer: 59 | Admitting: Internal Medicine

## 2024-06-04 ENCOUNTER — Encounter: Payer: Self-pay | Admitting: Internal Medicine

## 2024-06-04 VITALS — BP 126/80 | HR 73 | Ht 66.0 in | Wt 217.0 lb

## 2024-06-04 DIAGNOSIS — R7303 Prediabetes: Secondary | ICD-10-CM | POA: Insufficient documentation

## 2024-06-04 DIAGNOSIS — D352 Benign neoplasm of pituitary gland: Secondary | ICD-10-CM | POA: Insufficient documentation

## 2024-06-04 NOTE — Progress Notes (Unsigned)
 Name: Jaquise Young  MRN/ DOB: [id number], February 23, 1956    Age/ Sex: 68 y.o., male     PCP: Kennyth Worth HERO, MD   Reason for Endocrinology Evaluation: Hyperprolactinemia/hypogonadism     Initial Endocrinology Clinic Visit: 04/19/2021    PATIENT IDENTIFIER: Lucas Young is a 68 y.o., male with a past medical history of prolactinoma. He has followed with [facility] since 04/19/2021 for consultative assistance with management of his hyperprolactinemia.   HISTORICAL SUMMARY: The patient was first diagnosed with hyperprolactinemia in May 2022 with a prolactin level of 117.5 NG/mL.  He was started on cabergoline  at the time   Of note the patient has also been noted with low testosterone  at the time with a nadir of 101 NG/dL in 02/7976 but this has normalized    Patient has 2 biological children  Pituitary MRI did not show any pituitary adenoma 04/2021  SUBJECTIVE:    Today (06/04/2024):  [name] is here for a follow-up on hyperprolactinemia and hypogonadism.   Denies headaches  Denies vision changes  Denies galactorrhea  Denies constipation or diarrhea  Denies nausea  Denies erectile dysfunction   He follows with [facility]  for weight management, has recently restarted on Zepbound  2.5 mg weekly    Cabergoline  0.5 mg, 1 tabs  weekly ( Mondays and Friday)      HISTORY:  Past Medical History:  Past Medical History:  Diagnosis Date   Allergy    Anemia    as a kid per patient   Anxiety    Arthritis    Back pain    Depression    Food allergy    Hyperlipidemia    Hypertension    white coat syndrome per patient   Lactose intolerance    Metabolic syndrome    per patient- seeing weight loss MD for this   Morton's neuroma    Obesity    Pneumonia    walking pneumonia 16-17 yr ago   Pre-diabetes    history of pre-DM- out of zone d/t weight loss   Sleep apnea    uses CPAP   Past Surgical History:  Past  Surgical History:  Procedure Laterality Date   APPENDECTOMY     COLONOSCOPY  2010   Dr.Medoff  normal per pt   DRUG INDUCED ENDOSCOPY N/A 02/01/2022   Procedure: DRUG INDUCED SPLEEP ENDOSCOPY;  Surgeon: Carlie Clark, MD;  Location: [facility];  Service: ENT;  Laterality: N/A;   HEMATOMA EVACUATION Right 04/28/2022   Procedure: EVACUATION HEMATOMA OF NECK;  Surgeon: Carlie Clark, MD;  Location: [facility] OR;  Service: ENT;  Laterality: Right;   IMPLANTATION OF HYPOGLOSSAL NERVE STIMULATOR Right 04/26/2022   Procedure: IMPLANTATION OF HYPOGLOSSAL NERVE STIMULATOR;  Surgeon: Carlie Clark, MD;  Location: [facility] OR;  Service: ENT;  Laterality: Right;   UMBILICAL HERNIA REPAIR     Social History:  reports that he has never smoked. He has never used smokeless tobacco. He reports that he does not drink alcohol and does not use drugs. Family History:  Family History  Problem Relation Age of Onset   Hypertension Mother    Sleep apnea Mother    Obesity Mother    Colon polyps Father    Hypertension Father    Hyperlipidemia Father    Heart disease Father    Sleep apnea Father    Obesity Father    Hypertension Maternal Grandmother    Hypertension Maternal Grandfather    Other  Neg Hx        low testosterone    Colon cancer Neg Hx    Esophageal cancer Neg Hx    Stomach cancer Neg Hx    Rectal cancer Neg Hx      HOME MEDICATIONS: Allergies as of 06/04/2024       Reactions   Grass Pollen(k-o-r-t-swt Vern) Other (See Comments)   Molds & Smuts Other (See Comments)   Pollen Extract    Thimerosal (thiomersal) Itching   Eye redness         Medication List        Accurate as of June 04, 2024  7:38 AM. If you have any questions, ask your nurse or doctor.          azelastine  0.1 % nasal spray Commonly known as: ASTELIN  Place 2 sprays into both nostrils 2 (two) times daily.   benzonatate  200 MG capsule Commonly known as: TESSALON  Take 1 capsule (200 mg total) by mouth 2 (two)  times daily as needed for cough.   cabergoline  0.5 MG tablet Commonly known as: DOSTINEX  Take 0.5 tablets (0.25 mg total) by mouth 2 (two) times a week. Decrease Cabergoline  0.5 mg, 1 tab twice weekly (Monday and Friday)   desloratadine 5 MG tablet Commonly known as: CLARINEX Take 5 mg by mouth daily as needed (allergies).   diclofenac  75 MG EC tablet Commonly known as: VOLTAREN  Take 1 tablet (75 mg total) by mouth 2 (two) times daily.   EPINEPHrine  0.3 mg/0.3 mL Soaj injection Commonly known as: EPI-PEN Inject 0.3 mg into the muscle as needed for anaphylaxis.   fluticasone 50 MCG/ACT nasal spray Commonly known as: FLONASE Place 1 spray into both nostrils daily as needed for allergies.   meloxicam  15 MG tablet Commonly known as: MOBIC  Take 1 tablet (15 mg total) by mouth daily.   montelukast 10 MG tablet Commonly known as: SINGULAIR Take 10 mg by mouth daily as needed (allergies).   multivitamin capsule Take 1 capsule by mouth daily.   SYSTANE OP Place 1 drop into both eyes daily as needed (dry eyes).   telmisartan  40 MG tablet Commonly known as: MICARDIS    telmisartan -hydrochlorothiazide 40-12.5 MG tablet Commonly known as: MICARDIS  HCT Take 1 tablet by mouth daily.   tiZANidine  2 MG tablet Commonly known as: ZANAFLEX  Take 1 tablet (2 mg total) by mouth at bedtime.   VITAMIN C PO Take 1 tablet by mouth daily.   VITAMIN D  PO Take 1 capsule by mouth daily.   VITAMIN K PO Take 1 capsule by mouth daily.          OBJECTIVE:   PHYSICAL EXAM: VS: BP 126/80 (BP Location: Left Arm, Patient Position: Sitting, Cuff Size: Normal)   Pulse 73   Ht 5' 6 (1.676 m)   Wt 217 lb (98.4 kg) Comment: patient reported  SpO2 96%   BMI 35.02 kg/m    EXAM: General: Pt appears well and is in NAD  Lungs: Clear with good BS bilat   Heart: Auscultation: RRR.  Abdomen: Soft, nontender  Extremities:  BL LE: No pretibial edema   Mental Status: Judgment, insight:  Intact Orientation: Oriented to time, place, and person Mood and affect: No depression, anxiety, or agitation     DATA REVIEWED:   Latest Reference Range & Units 06/04/24 08:00  Sodium 135 - 146 mmol/L 138  Potassium 3.5 - 5.3 mmol/L 3.7  Chloride 98 - 110 mmol/L 99  CO2 20 - 32 mmol/L 30  Glucose  65 - 99 mg/dL 93  Mean Plasma Glucose mg/dL 876  BUN 7 - 25 mg/dL 17  Creatinine 9.29 - 8.64 mg/dL 9.15  Calcium 8.6 - 89.6 mg/dL 9.8  BUN/Creatinine Ratio 6 - 22 (calc) SEE NOTE:  eGFR > OR = 60 mL/min/1.26m2 96    Latest Reference Range & Units 06/04/24 08:00  eAG (mmol/L) mmol/L 6.8  Glucose 65 - 99 mg/dL 93  Hemoglobin J8R <4.2 % 5.9 (H)     Latest Reference Range & Units 06/04/24 08:00  TSH 0.40 - 4.50 mIU/L 3.70       Latest Reference Range & Units 07/26/23 07:55 07/26/23 08:09  Prolactin 2.0 - 18.0 ng/mL 5.4   Testosterone , Total, LC-MS-MS 250 - 1,100 ng/dL  419     MRI brain 3/80/7977   Dedicated pituitary protocol was performed. 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 The pituitary parenchyma enhances relatively homogeneously on dynamic protocol without convincing discrete pituitary lesion. The infundibulum is midline and unremarkable. No suprasellar extension of pituitary parenchyma. Unremarkable optic chiasm.    ASSESSMENT / PLAN / RECOMMENDATIONS:   Prolactinoma   -He has responded well to cabergoline  -MRI in 2022 did not reveal any pituitary adenoma - Prolactin remains low and stable, will decrease cabergoline    Medications   Cabergoline  0.5 mg,HALF a  tab ONCE  weekly   2. Prediabetes :  - A1c remains elevated at 5.9%-He is currently following up with [facility] for weight management -Was recently restarted on Zepbound  -He also follows healthy lifestyle   3. Hx low testosterone :  - Patient requesting testosterone  to be checked - This has been normal last year, currently no  symptoms - Testosterone  pending  Follow-up in 6 months    Signed electronically by: Stefano Redgie Butts, MD  [facility]  [facility] [address] [place], [place] [address] Phone: [phone] FAX: [phone]      CC: Kennyth Worth HERO, MD [address] [place] [address] Phone: [phone]  Fax: [phone]   Return to Endocrinology clinic as below: Future Appointments  Date Time Provider Department Center  06/13/2024  8:15 AM Claudene Arthea HERO, DO [facility] None

## 2024-06-05 ENCOUNTER — Ambulatory Visit: Payer: Self-pay | Admitting: Internal Medicine

## 2024-06-05 MED ORDER — CABERGOLINE 0.5 MG PO TABS: 0.2500 mg | ORAL_TABLET | ORAL | 3 refills | Status: DC

## 2024-06-07 LAB — TESTOSTERONE, TOTAL, LC/MS/MS: Testosterone, Total, LC-MS-MS: 454 ng/dL (ref 250–1100)

## 2024-06-07 LAB — BASIC METABOLIC PANEL WITH GFR
BUN: 17 mg/dL (ref 7–25)
CO2: 30 mmol/L (ref 20–32)
Calcium: 9.8 mg/dL (ref 8.6–10.3)
Chloride: 99 mmol/L (ref 98–110)
Creat: 0.84 mg/dL (ref 0.70–1.35)
Glucose, Bld: 93 mg/dL (ref 65–99)
Potassium: 3.7 mmol/L (ref 3.5–5.3)
Sodium: 138 mmol/L (ref 135–146)
eGFR: 96 mL/min/1.73m2 (ref >=60)

## 2024-06-07 LAB — HEMOGLOBIN A1C
Hgb A1c MFr Bld: 5.9 % — ABNORMAL HIGH (ref <5.7)
Mean Plasma Glucose: 123 mg/dL
eAG (mmol/L): 6.8 mmol/L

## 2024-06-07 LAB — PROLACTIN: Prolactin: 5.3 ng/mL (ref 2.0–18.0)

## 2024-06-07 LAB — TSH: TSH: 3.7 m[IU]/L (ref 0.40–4.50)

## 2024-06-11 NOTE — Progress Notes (Unsigned)
 [facility] [address] [place] [address] Phone: [phone] Subjective:   [name] am a scribe for Dr. Claudene.   I'Young seeing this patient by the request  of:  Lucas Worth HERO, MD  CC: Back and neck pain will follow-up  YEP:Dlagzrupcz  Lucas Young is a 68 y.o. male coming in with complaint of back and neck pain. OMT 04/25/2024. Patient states that they are ok. They need some help. A little stiff.   Medications patient has been prescribed: None  Taking: none         Reviewed prior external information including notes and imaging from previsou exam, outside providers and external EMR if available.   As well as notes that were available from care everywhere and other healthcare systems.  Past medical history, social, surgical and family history all reviewed in electronic medical record.  No pertanent information unless stated regarding to the chief complaint.   Past Medical History:  Diagnosis Date   Allergy    Anemia    as a kid per patient   Anxiety    Arthritis    Back pain    Depression    Food allergy    Hyperlipidemia    Hypertension    white coat syndrome per patient   Lactose intolerance    Metabolic syndrome    per patient- seeing weight loss MD for this   Morton's neuroma    Obesity    Pneumonia    walking pneumonia 16-17 yr ago   Pre-diabetes    history of pre-DM- out of zone d/t weight loss   Sleep apnea    uses CPAP    Allergies  Allergen Reactions   Grass Pollen(K-O-R-T-Swt Vern) Other (See Comments)   Molds & Smuts Other (See Comments)   Pollen Extract    Thimerosal (Thiomersal) Itching    Eye redness      Review of Systems:  No headache, visual changes, nausea, vomiting, diarrhea, constipation, dizziness, abdominal pain, skin rash, fevers, chills, night sweats, weight loss, swollen lymph nodes, body aches, joint swelling, chest pain, shortness of breath, mood changes.  POSITIVE muscle aches  Objective  Blood pressure (!) 130/90, pulse 86, height 5' 6 (1.676 Young), SpO2 95%.   General: No apparent distress alert and oriented x3 mood and affect normal, dressed appropriately.  HEENT: Pupils equal, extraocular movements intact  Respiratory: Patient's speak in full sentences and does not appear short of breath  Cardiovascular: No lower extremity edema, non tender, no erythema  Gait MSK:  Back continued stiffness noted.  Continued loss of lordosis.  Tenderness over the sacroiliac joint right greater than left.  Tightness in the paraspinal musculature of the right side compared to the left as well.  Tingling in the mid back in a dermatome more consistent with the T9 on the right  Osteopathic findings  C2 flexed rotated and side bent right C6 flexed rotated and side bent left T3 extended rotated and side bent right inhaled rib T9 extended rotated and side bent right sided L2 flexed rotated and side bent right Sacrum right on right       Assessment and Plan:  SI (sacroiliac) joint dysfunction Continued tightness noted.  Continuing to work on core strength.  Patient has been doing relatively well with this.  Is going to be traveling a little bit but nothing too much.  Caring for his 79 year old mother.  No change in medication other than starting acyclovir  in  case patient is having early shingle like reaction.  Follow-up again thereafter    Nonallopathic problems  Decision today to treat with OMT was based on Physical Exam  After verbal consent patient was treated with HVLA, ME, FPR techniques in cervical, rib, thoracic, lumbar, and sacral  areas  Patient tolerated the procedure well with improvement in symptoms  Patient given exercises, stretches and lifestyle modifications  See medications in patient instructions if given  Patient will follow up in 4-8 weeks     The above documentation has been reviewed and is accurate and complete Lucas Young  Lucas Inabinet, DO         Note: This dictation was prepared with Dragon dictation along with smaller phrase technology. Any transcriptional errors that result from this process are unintentional.

## 2024-06-13 ENCOUNTER — Encounter: Payer: Self-pay | Admitting: Family Medicine

## 2024-06-13 ENCOUNTER — Ambulatory Visit: Admitting: Family Medicine

## 2024-06-13 VITALS — BP 130/90 | HR 86 | Ht 66.0 in

## 2024-06-13 DIAGNOSIS — M9904 Segmental and somatic dysfunction of sacral region: Secondary | ICD-10-CM

## 2024-06-13 DIAGNOSIS — M533 Sacrococcygeal disorders, not elsewhere classified: Secondary | ICD-10-CM

## 2024-06-13 DIAGNOSIS — M9908 Segmental and somatic dysfunction of rib cage: Secondary | ICD-10-CM | POA: Diagnosis not present

## 2024-06-13 DIAGNOSIS — M9903 Segmental and somatic dysfunction of lumbar region: Secondary | ICD-10-CM

## 2024-06-13 DIAGNOSIS — M9901 Segmental and somatic dysfunction of cervical region: Secondary | ICD-10-CM

## 2024-06-13 DIAGNOSIS — M9902 Segmental and somatic dysfunction of thoracic region: Secondary | ICD-10-CM

## 2024-06-13 MED ORDER — ACYCLOVIR 400 MG PO TABS: 400.0000 mg | ORAL_TABLET | Freq: Three times a day (TID) | ORAL | 0 refills | Status: AC

## 2024-06-13 NOTE — Patient Instructions (Addendum)
 Good to see you. See me again in 2 months. Acyclovir  400 mg 3x a day for 5 days.

## 2024-06-13 NOTE — Assessment & Plan Note (Signed)
 Continued tightness noted.  Continuing to work on core strength.  Patient has been doing relatively well with this.  Is going to be traveling a little bit but nothing too much.  Caring for his 67 year old mother.  No change in medication other than starting acyclovir  in case patient is having early shingle like reaction.  Follow-up again thereafter

## 2024-06-19 ENCOUNTER — Encounter: Payer: Self-pay | Admitting: Family Medicine

## 2024-06-20 MED ORDER — PREDNISONE 20 MG PO TABS
20.0000 mg | ORAL_TABLET | Freq: Every day | ORAL | 0 refills | Status: DC
Start: 2024-06-20 — End: 2024-10-22

## 2024-06-20 MED ORDER — TRIAMCINOLONE ACETONIDE 0.5 % EX CREA
1.0000 | TOPICAL_CREAM | Freq: Two times a day (BID) | CUTANEOUS | 3 refills | Status: DC
Start: 2024-06-20 — End: 2024-10-22

## 2024-06-28 MED ORDER — VALACYCLOVIR HCL 1 G PO TABS: 1000.0000 mg | ORAL_TABLET | Freq: Two times a day (BID) | ORAL | 2 refills | Status: DC

## 2024-06-28 NOTE — Addendum Note (Signed)
 Addended by: CLAUDENE ARTHEA HERO on: 06/28/2024 08:15 AM   Modules accepted: Orders

## 2024-07-03 ENCOUNTER — Ambulatory Visit: Admitting: Family Medicine

## 2024-07-03 ENCOUNTER — Encounter: Payer: Self-pay | Admitting: Family Medicine

## 2024-07-03 ENCOUNTER — Ambulatory Visit (INDEPENDENT_AMBULATORY_CARE_PROVIDER_SITE_OTHER)

## 2024-07-03 VITALS — BP 118/90 | HR 73 | Ht 66.0 in

## 2024-07-03 DIAGNOSIS — M9908 Segmental and somatic dysfunction of rib cage: Secondary | ICD-10-CM

## 2024-07-03 DIAGNOSIS — M5030 Other cervical disc degeneration, unspecified cervical region: Secondary | ICD-10-CM | POA: Diagnosis not present

## 2024-07-03 DIAGNOSIS — M9902 Segmental and somatic dysfunction of thoracic region: Secondary | ICD-10-CM | POA: Diagnosis not present

## 2024-07-03 DIAGNOSIS — M9903 Segmental and somatic dysfunction of lumbar region: Secondary | ICD-10-CM | POA: Diagnosis not present

## 2024-07-03 DIAGNOSIS — M9904 Segmental and somatic dysfunction of sacral region: Secondary | ICD-10-CM

## 2024-07-03 DIAGNOSIS — M9901 Segmental and somatic dysfunction of cervical region: Secondary | ICD-10-CM | POA: Diagnosis not present

## 2024-07-03 DIAGNOSIS — M542 Cervicalgia: Secondary | ICD-10-CM | POA: Diagnosis not present

## 2024-07-03 MED ORDER — KETOROLAC TROMETHAMINE 60 MG/2ML IM SOLN
60.0000 mg | Freq: Once | INTRAMUSCULAR | Status: AC
  Administered 2024-07-03: 60 mg via INTRAMUSCULAR

## 2024-07-03 MED ORDER — METHYLPREDNISOLONE ACETATE 80 MG/ML IJ SUSP
80.0000 mg | Freq: Once | INTRAMUSCULAR | Status: AC
  Administered 2024-07-03: 80 mg via INTRAMUSCULAR
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
## 2024-07-03 NOTE — Patient Instructions (Addendum)
 Xray today Injections in backside See me again as scheduled
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
## 2024-07-04 ENCOUNTER — Other Ambulatory Visit: Payer: Self-pay

## 2024-07-04 ENCOUNTER — Encounter: Payer: Self-pay | Admitting: Family Medicine

## 2024-07-04 DIAGNOSIS — M542 Cervicalgia: Secondary | ICD-10-CM

## 2024-07-04 NOTE — Telephone Encounter (Signed)
CT scan ordered STAT

## 2024-07-05 ENCOUNTER — Ambulatory Visit
Admission: RE | Admit: 2024-07-05 | Discharge: 2024-07-05 | Disposition: A | Discharge status: Home / Self Care | Source: Ambulatory Visit | Attending: Family Medicine | Admitting: Family Medicine

## 2024-07-05 ENCOUNTER — Ambulatory Visit: Payer: Self-pay | Admitting: Family Medicine

## 2024-07-05 DIAGNOSIS — M542 Cervicalgia: Secondary | ICD-10-CM

## 2024-07-09 ENCOUNTER — Other Ambulatory Visit: Payer: Self-pay

## 2024-07-09 DIAGNOSIS — M5030 Other cervical disc degeneration, unspecified cervical region: Secondary | ICD-10-CM

## 2024-07-11 ENCOUNTER — Ambulatory Visit: Admitting: Physical Therapy

## 2024-07-11 ENCOUNTER — Encounter: Payer: Self-pay | Admitting: Physical Therapy

## 2024-07-11 ENCOUNTER — Other Ambulatory Visit: Payer: Self-pay

## 2024-07-11 DIAGNOSIS — M542 Cervicalgia: Secondary | ICD-10-CM

## 2024-07-11 DIAGNOSIS — M5030 Other cervical disc degeneration, unspecified cervical region: Secondary | ICD-10-CM | POA: Diagnosis not present
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
## 2024-07-11 NOTE — Patient Instructions (Signed)
 Access Code: [email or web address] URL: [email or web address] Date: 07/11/2024 Prepared by: Elaine Daring  Exercises - Seated Cervical Sidebending Stretch  - 1-2 x daily - 3 reps - 15-20 seconds hold - Seated Levator Scapulae Stretch  - 1-2 x daily - 3 reps - 15-20 seconds hold

## 2024-07-17 ENCOUNTER — Ambulatory Visit: Payer: Self-pay | Admitting: Family Medicine
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
## 2024-07-18 ENCOUNTER — Encounter: Payer: Self-pay | Admitting: Physical Therapy

## 2024-07-18 ENCOUNTER — Ambulatory Visit: Admitting: Physical Therapy

## 2024-07-18 ENCOUNTER — Other Ambulatory Visit: Payer: Self-pay

## 2024-07-18 DIAGNOSIS — M542 Cervicalgia: Secondary | ICD-10-CM | POA: Diagnosis not present

## 2024-07-18 NOTE — Patient Instructions (Signed)
 Access Code: [email or web address] URL: [email or web address] Date: 07/18/2024 Prepared by: Elaine Daring  Exercises - Seated Cervical Sidebending Stretch  - 1-2 x daily - 3 reps - 15-20 seconds hold - Seated Levator Scapulae Stretch  - 1-2 x daily - 3 reps - 15-20 seconds hold - Standing Row with Anchored Resistance  - 1 x daily - 3 sets - 10 reps

## 2024-07-25 ENCOUNTER — Other Ambulatory Visit: Payer: Self-pay

## 2024-07-25 ENCOUNTER — Encounter: Payer: Self-pay | Admitting: Physical Therapy

## 2024-07-25 ENCOUNTER — Ambulatory Visit: Admitting: Physical Therapy

## 2024-07-25 DIAGNOSIS — M542 Cervicalgia: Secondary | ICD-10-CM
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
## 2024-07-25 NOTE — Patient Instructions (Signed)
 Access Code: [email or web address] URL: [email or web address] Date: 07/25/2024 Prepared by: Elaine Daring  Exercises - Seated Cervical Sidebending Stretch  - 1-2 x daily - 3 reps - 15-20 seconds hold - Seated Levator Scapulae Stretch  - 1-2 x daily - 3 reps - 15-20 seconds hold - Standing Row with Anchored Resistance  - 1 x daily - 3 sets - 10 reps - Seated Assisted Cervical Rotation with Towel  - 1 x daily - 3 sets - 10 reps

## 2024-07-30 NOTE — Progress Notes (Signed)
 Not sure why this was sent as a result note but yes it is fine for them to transfer care back to Dr Prentiss.

## 2024-07-30 NOTE — Telephone Encounter (Signed)
 Copied from CRM [id number]. Topic: Appointments - Transfer of Care >> Jul 30, 2024 12:49 PM Henretta I wrote: Pt is requesting to transfer FROM: Dr. Kennyth Bart Pt is requesting to transfer TO: Dr. Geni Shutter  Reason for requested transfer: Patient has seen Dr.Wallace at other practices and just wants to continue care with her under primary as well  It is the responsibility of the team the patient would like to transfer to (Dr. Geni Shutter) to reach out to the patient if for any reason this transfer is not acceptable.

## 2024-08-14 NOTE — Progress Notes (Unsigned)
 [facility] [address] [place] [address] Phone: [phone] Subjective:   Lucas Young, am serving as a scribe for Dr. Arthea Young.  I'm seeing this patient by the request  of:  Lucas Worth HERO, MD  CC: Neck pain follow-up  YEP:Dlagzrupcz  Lucas Young is a 68 y.o. male coming in with complaint of back and neck pain. OMT 07/03/2024. Has been doing PT with Lucas Young. Patient states dry needling worked well.  No new symptoms.  Medications patient has been prescribed: Valtrex   Taking:         Reviewed prior external information including notes and imaging from previsou exam, outside providers and external EMR if available.   As well as notes that were available from care everywhere and other healthcare systems.  Past medical history, social, surgical and family history all reviewed in electronic medical record.  No pertanent information unless stated regarding to the chief complaint.   Past Medical History:  Diagnosis Date   Allergy    Anemia    as a kid per patient   Anxiety    Arthritis    Back pain    Depression    Food allergy    Hyperlipidemia    Hypertension    white coat syndrome per patient   Lactose intolerance    Metabolic syndrome    per patient- seeing weight loss MD for this   Morton's neuroma    Obesity    Pneumonia    walking pneumonia 16-17 yr ago   Pre-diabetes    history of pre-DM- out of zone d/t weight loss   Sleep apnea    uses CPAP    Allergies  Allergen Reactions   Grass Pollen([id number]-Swt Vern) Other (See Comments)   Molds & Smuts Other (See Comments)   Pollen Extract    Thimerosal (Thiomersal) Itching    Eye redness      Review of Systems:  No headache, visual changes, nausea, vomiting, diarrhea, constipation, dizziness, abdominal pain, skin rash, fevers, chills, night sweats, weight loss, swollen lymph nodes, body aches, joint swelling, chest pain, shortness of breath,  mood changes. POSITIVE muscle aches  Objective  Blood pressure (!) 128/90, pulse 75, height 5' 6 (1.676 m), SpO2 96%.   General: No apparent distress alert and oriented x3 mood and affect normal, dressed appropriately.  HEENT: Pupils equal, extraocular movements intact  Respiratory: Patient's speak in full sentences and does not appear short of breath  Cardiovascular: No lower extremity edema, non tender, no erythema  Gait MSK:  Back does have some mild loss lordosis.  Neck exam still has some limited range of motion especially with left sided sidebending and right sided rotation.  Lacks last 5 degrees of extension of the neck.  Osteopathic findings  C2 flexed rotated and side bent right C7 flexed rotated and side bent left T3 extended rotated and side bent right inhaled rib T7 extended rotated and side bent left L2 flexed rotated and side bent right Sacrum right on right       Assessment and Plan:  Degenerative disc disease, cervical Chronic problem that is responding relatively well though to conservative therapy including osteopathic manipulation after further evaluation.  We also discussed with patient about which activities to do and which ones to avoid.  Increasing activity slowly.  Discussed icing regimen.  Follow-up with me again in 6 to 8 weeks.  No change in medication at the moment.    Nonallopathic problems  Decision today to treat with OMT was based on Physical Exam  After verbal consent patient was treated with HVLA, ME, FPR techniques in cervical, rib, thoracic, lumbar, and sacral  areas  Patient tolerated the procedure well with improvement in symptoms  Patient given exercises, stretches and lifestyle modificati weeks ons  See medications in patient instructions if given  Patient will follow up in 4-8 weeks     The above documentation has been reviewed and is accurate and complete Lucas CHRISTELLA Sharps, DO         Note: This dictation was prepared  with Dragon dictation along with smaller phrase technology. Any transcriptional errors that result from this process are unintentional.

## 2024-08-15 ENCOUNTER — Ambulatory Visit: Admitting: Family Medicine

## 2024-08-15 ENCOUNTER — Encounter: Payer: Self-pay | Admitting: Family Medicine

## 2024-08-15 VITALS — BP 128/90 | HR 75 | Ht 66.0 in

## 2024-08-15 DIAGNOSIS — M9903 Segmental and somatic dysfunction of lumbar region: Secondary | ICD-10-CM

## 2024-08-15 DIAGNOSIS — M9908 Segmental and somatic dysfunction of rib cage: Secondary | ICD-10-CM

## 2024-08-15 DIAGNOSIS — M9904 Segmental and somatic dysfunction of sacral region: Secondary | ICD-10-CM

## 2024-08-15 DIAGNOSIS — M9902 Segmental and somatic dysfunction of thoracic region: Secondary | ICD-10-CM

## 2024-08-15 DIAGNOSIS — M9901 Segmental and somatic dysfunction of cervical region: Secondary | ICD-10-CM

## 2024-08-15 DIAGNOSIS — M5030 Other cervical disc degeneration, unspecified cervical region: Secondary | ICD-10-CM

## 2024-08-15 NOTE — Assessment & Plan Note (Signed)
 Chronic problem that is responding relatively well though to conservative therapy including osteopathic manipulation after further evaluation.  We also discussed with patient about which activities to do and which ones to avoid.  Increasing activity slowly.  Discussed icing regimen.  Follow-up with me again in 6 to 8 weeks.  No change in medication at the moment.

## 2024-08-15 NOTE — Patient Instructions (Signed)
 Good to see you. Will watch the world burn together. See me again in 6 weeks.

## 2024-08-27 ENCOUNTER — Other Ambulatory Visit: Payer: Self-pay

## 2024-08-27 ENCOUNTER — Encounter: Payer: Self-pay | Admitting: Family Medicine

## 2024-08-27 ENCOUNTER — Ambulatory Visit: Admitting: Family Medicine

## 2024-08-27 VITALS — BP 124/72 | HR 70 | Ht 66.0 in

## 2024-08-27 DIAGNOSIS — M25511 Pain in right shoulder: Secondary | ICD-10-CM | POA: Diagnosis not present

## 2024-08-27 DIAGNOSIS — S4351XA Sprain of right acromioclavicular joint, initial encounter: Secondary | ICD-10-CM | POA: Diagnosis not present

## 2024-08-27 DIAGNOSIS — M75111 Incomplete rotator cuff tear or rupture of right shoulder, not specified as traumatic: Secondary | ICD-10-CM

## 2024-08-27 DIAGNOSIS — S4350XA Sprain of unspecified acromioclavicular joint, initial encounter: Secondary | ICD-10-CM | POA: Insufficient documentation

## 2024-08-27 DIAGNOSIS — M75110 Incomplete rotator cuff tear or rupture of unspecified shoulder, not specified as traumatic: Secondary | ICD-10-CM | POA: Insufficient documentation

## 2024-08-27 MED ORDER — NITROGLYCERIN 0.2 MG/HR TD PT24: MEDICATED_PATCH | TRANSDERMAL | 1 refills | Status: AC

## 2024-08-27 NOTE — Assessment & Plan Note (Signed)
 Injection given, tolerated procedure well, discussed with patient that there is also an underlying small partial rotator cuff tear and we will need to monitor.

## 2024-08-27 NOTE — Assessment & Plan Note (Signed)
 Rotator cuff, discussed nitroglycerin patches.

## 2024-08-27 NOTE — Progress Notes (Signed)
 [facility] [address] [place] [address] Phone: [phone] Subjective:    I'm seeing this patient by the request  of:  Kennyth Worth HERO, MD  CC: Right shoulder pain after fall  YEP:Dlagzrupcz  Lucas Young is a 68 y.o. male coming in with complaint of R shoulder pain. Patient states that he tripped on a root and fell on pavement. Developed pain in back of shoulder. Using meloxicam  to help decrease pain when he sleeps. Painful to extend arm.       Past Medical History:  Diagnosis Date   Allergy    Anemia    as a kid per patient   Anxiety    Arthritis    Back pain    Depression    Food allergy    Hyperlipidemia    Hypertension    white coat syndrome per patient   Lactose intolerance    Metabolic syndrome    per patient- seeing weight loss MD for this   Morton's neuroma    Obesity    Pneumonia    walking pneumonia 16-17 yr ago   Pre-diabetes    history of pre-DM- out of zone d/t weight loss   Sleep apnea    uses CPAP   Past Surgical History:  Procedure Laterality Date   APPENDECTOMY     COLONOSCOPY  2010   Dr.Medoff  normal per pt   DRUG INDUCED ENDOSCOPY N/A 02/01/2022   Procedure: DRUG INDUCED SPLEEP ENDOSCOPY;  Surgeon: Carlie Clark, MD;  Location: [facility];  Service: ENT;  Laterality: N/A;   HEMATOMA EVACUATION Right 04/28/2022   Procedure: EVACUATION HEMATOMA OF NECK;  Surgeon: Carlie Clark, MD;  Location: [facility] OR;  Service: ENT;  Laterality: Right;   IMPLANTATION OF HYPOGLOSSAL NERVE STIMULATOR Right 04/26/2022   Procedure: IMPLANTATION OF HYPOGLOSSAL NERVE STIMULATOR;  Surgeon: Carlie Clark, MD;  Location: [facility] OR;  Service: ENT;  Laterality: Right;   UMBILICAL HERNIA REPAIR     Social History   Socioeconomic History   Marital status: Married    Spouse name: Not on file   Number of children: Not on file   Years of education: Not on file   Highest education level: Doctorate   Occupational History   Occupation: Professor  Tobacco Use   Smoking status: Never   Smokeless tobacco: Never  Vaping Use   Vaping status: Never Used  Substance and Sexual Activity   Alcohol use: No    Alcohol/week: 0.0 standard drinks of alcohol   Drug use: No   Sexual activity: Not on file  Other Topics Concern   Not on file  Social History Narrative   Not on file   Social Drivers of Health   Financial Resource Strain: Low Risk  (01/04/2024)   Overall Financial Resource Strain (CARDIA)    Difficulty of Paying Living Expenses: Not hard at all  Food Insecurity: No Food Insecurity (01/04/2024)   Hunger Vital Sign    Worried About Running Out of Food in the Last Year: Never true    Ran Out of Food in the Last Year: Never true  Transportation Needs: No Transportation Needs (01/04/2024)   PRAPARE - Administrator, Civil Service (Medical): No    Lack of Transportation (Non-Medical): No  Physical Activity: Sufficiently Active (01/04/2024)   Exercise Vital Sign    Days of Exercise per Week: 7 days    Minutes of Exercise per Session: 60 min  Stress: No  Stress Concern Present (01/04/2024)   [employer] of Occupational Health - Occupational Stress Questionnaire    Feeling of Stress : Only a little  Social Connections: Socially Integrated (01/04/2024)   Social Connection and Isolation Panel    Frequency of Communication with Friends and Family: More than three times a week    Frequency of Social Gatherings with Friends and Family: Once a week    Attends Religious Services: More than 4 times per year    Active Member of [employer] or Organizations: Yes    Attends Engineer, structural: More than 4 times per year    Marital Status: Married   Allergies  Allergen Reactions   Grass Pollen(K-O-R-T-Swt Vern) Other (See Comments)   Molds & Smuts Other (See Comments)   Pollen Extract    Thimerosal (Thiomersal) Itching    Eye redness    Family History  Problem Relation Age  of Onset   Hypertension Mother    Sleep apnea Mother    Obesity Mother    Colon polyps Father    Hypertension Father    Hyperlipidemia Father    Heart disease Father    Sleep apnea Father    Obesity Father    Hypertension Maternal Grandmother    Hypertension Maternal Grandfather    Other Neg Hx        low testosterone    Colon cancer Neg Hx    Esophageal cancer Neg Hx    Stomach cancer Neg Hx    Rectal cancer Neg Hx     Current Outpatient Medications (Endocrine & Metabolic):    cabergoline  (DOSTINEX ) 0.5 MG tablet, Take 0.5 tablets (0.25 mg total) by mouth once a week. Decrease Cabergoline  0.5 mg, 1 tab twice weekly (Monday and Friday)   predniSONE  (DELTASONE ) 20 MG tablet, Take 1 tablet (20 mg total) by mouth daily with breakfast.  Current Outpatient Medications (Cardiovascular):    EPINEPHrine  0.3 mg/0.3 mL IJ SOAJ injection, Inject 0.3 mg into the muscle as needed for anaphylaxis.   telmisartan  (MICARDIS ) 40 MG tablet,    telmisartan -hydrochlorothiazide (MICARDIS  HCT) 40-12.5 MG tablet, Take 1 tablet by mouth daily.  Current Outpatient Medications (Respiratory):    azelastine  (ASTELIN ) 0.1 % nasal spray, Place 2 sprays into both nostrils 2 (two) times daily.   benzonatate  (TESSALON ) 200 MG capsule, Take 1 capsule (200 mg total) by mouth 2 (two) times daily as needed for cough.   desloratadine (CLARINEX) 5 MG tablet, Take 5 mg by mouth daily as needed (allergies).   fluticasone (FLONASE) 50 MCG/ACT nasal spray, Place 1 spray into both nostrils daily as needed for allergies.   montelukast (SINGULAIR) 10 MG tablet, Take 10 mg by mouth daily as needed (allergies).  Current Outpatient Medications (Analgesics):    diclofenac  (VOLTAREN ) 75 MG EC tablet, Take 1 tablet (75 mg total) by mouth 2 (two) times daily.   meloxicam  (MOBIC ) 15 MG tablet, Take 1 tablet (15 mg total) by mouth daily.   Current Outpatient Medications (Other):    Ascorbic Acid (VITAMIN C PO), Take 1 tablet by  mouth daily.   Multiple Vitamin (MULTIVITAMIN) capsule, Take 1 capsule by mouth daily.   Polyethyl Glycol-Propyl Glycol (SYSTANE OP), Place 1 drop into both eyes daily as needed (dry eyes).   tiZANidine  (ZANAFLEX ) 2 MG tablet, Take 1 tablet (2 mg total) by mouth at bedtime.   triamcinolone  cream (KENALOG ) 0.5 %, Apply 1 Application topically 2 (two) times daily. To affected areas.   valACYclovir  (VALTREX ) 1000 MG tablet, Take  1 tablet (1,000 mg total) by mouth 2 (two) times daily.   VITAMIN D  PO, Take 1 capsule by mouth daily.   VITAMIN K PO, Take 1 capsule by mouth daily.   Reviewed prior external information including notes and imaging from  primary care provider As well as notes that were available from care everywhere and other healthcare systems.  Past medical history, social, surgical and family history all reviewed in electronic medical record.  No pertanent information unless stated regarding to the chief complaint.   Review of Systems:  No headache, visual changes, nausea, vomiting, diarrhea, constipation, dizziness, abdominal pain, skin rash, fevers, chills, night sweats, weight loss, swollen lymph nodes, body aches, joint swelling, chest pain, shortness of breath, mood changes. POSITIVE muscle aches  Objective  Blood pressure 124/72, pulse 70, height 5' 6 (1.676 m), SpO2 96%.   General: No apparent distress alert and oriented x3 mood and affect normal, dressed appropriately.  HEENT: Pupils equal, extraocular movements intact  Respiratory: Patient's speak in full sentences and does not appear short of breath  Cardiovascular: No lower extremity edema, non tender, no erythema  Right shoulder does have relatively good range of motion but does have some tenderness in the terminal area.  Patient does have tenderness over the acromioclavicular joint.  Positive crossover noted. Tenderness with empty can but no significant weakness  Limited muscular skeletal ultrasound was performed  and interpreted by CLAUDENE HUSSAR, M  Limited ultrasound shows patient does have a partial low-grade tear noted of the supraspinatus with no significant retraction noted.  Area of tearing noted is approximately 1 cm.  Patient does have significant hypoechoic changes of the acromioclavicular joint noted.  Impression: Low-grade partial thickness supraspinatus tear and effusion of the acromioclavicular joint  Procedure: Real-time Ultrasound Guided Injection of right acromioclavicular joint Device: GE Logiq Q7 Ultrasound guided injection is preferred based studies that show increased duration, increased effect, greater accuracy, decreased procedural pain, increased response rate, and decreased cost with ultrasound guided versus blind injection.  Verbal informed consent obtained.  Time-out conducted.  Noted no overlying erythema, induration, or other signs of local infection.  Skin prepped in a sterile fashion.  Local anesthesia: Topical Ethyl chloride.  With sterile technique and under real time ultrasound guidance: With a 25-gauge half inch needle injected with 0.5 cc 0.5% Marcaine and 0.5 cc of Kenalog  40 mg/mL Completed without difficulty  Pain immediately resolved suggesting accurate placement of the medication.  Advised to call if fevers/chills, erythema, induration, drainage, or persistent bleeding.  Images saved Impression: Technically successful ultrasound guided injection.   Impression and Recommendations:    The above documentation has been reviewed and is accurate and complete Mistina Coatney M Kolby Schara, DO

## 2024-08-27 NOTE — Patient Instructions (Addendum)
 Injected R AC jt today Exercises Nitroglycerin Protocol   Apply 1/4 nitroglycerin patch to affected area daily.  Change position of patch within the affected area every 24 hours.  You may experience a headache during the first 1-2 weeks of using the patch, these should subside.  If you experience headaches after beginning nitroglycerin patch treatment, you may take your preferred over the counter pain reliever.  Another side effect of the nitroglycerin patch is skin irritation or rash related to patch adhesive.  Please notify our office if you develop more severe headaches or rash, and stop the patch.  Tendon healing with nitroglycerin patch may require 12 to 24 weeks depending on the extent of injury.  Men should not use if taking Viagra, Cialis, or Levitra.   Do not use if you have migraines or rosacea.  See me again as scheduled

## 2024-09-02 ENCOUNTER — Encounter: Payer: Self-pay | Admitting: Family Medicine

## 2024-09-02 ENCOUNTER — Other Ambulatory Visit: Payer: Self-pay | Admitting: Family Medicine

## 2024-09-02 DIAGNOSIS — M25511 Pain in right shoulder: Secondary | ICD-10-CM

## 2024-09-02 NOTE — Telephone Encounter (Signed)
 Referral placed.

## 2024-09-03 ENCOUNTER — Encounter: Payer: Self-pay | Admitting: Physical Therapy

## 2024-09-03 ENCOUNTER — Ambulatory Visit: Admitting: Physical Therapy

## 2024-09-03 ENCOUNTER — Other Ambulatory Visit: Payer: Self-pay

## 2024-09-03 DIAGNOSIS — M6281 Muscle weakness (generalized): Secondary | ICD-10-CM

## 2024-09-03 DIAGNOSIS — M25511 Pain in right shoulder: Secondary | ICD-10-CM | POA: Diagnosis not present

## 2024-09-03 DIAGNOSIS — G8929 Other chronic pain: Secondary | ICD-10-CM | POA: Diagnosis not present

## 2024-09-03 MED ORDER — TELMISARTAN-HCTZ 40-12.5 MG PO TABS: 1.0000 | ORAL_TABLET | Freq: Every day | ORAL | 0 refills | Status: DC

## 2024-09-03 NOTE — Therapy (Signed)
 OUTPATIENT PHYSICAL THERAPY EVALUATION   Patient Name: Lucas Young MRN: [id number] DOB:09/08/1956, 68 y.o., male Today's Date: 09/03/2024   END OF SESSION:  PT End of Session - 09/03/24 1455     Visit Number 1    Number of Visits 8    Date for Recertification  10/29/24    Authorization Type [employer]    PT Start Time 1430    PT Stop Time 1515    PT Time Calculation (min) 45 min    Activity Tolerance Patient tolerated treatment well    Behavior During Therapy WFL for tasks assessed/performed          Past Medical History:  Diagnosis Date   Allergy    Anemia    as a kid per patient   Anxiety    Arthritis    Back pain    Depression    Food allergy    Hyperlipidemia    Hypertension    white coat syndrome per patient   Lactose intolerance    Metabolic syndrome    per patient- seeing weight loss MD for this   Morton's neuroma    Obesity    Pneumonia    walking pneumonia 16-17 yr ago   Pre-diabetes    history of pre-DM- out of zone d/t weight loss   Sleep apnea    uses CPAP   Past Surgical History:  Procedure Laterality Date   APPENDECTOMY     COLONOSCOPY  2010   Dr.Medoff  normal per pt   DRUG INDUCED ENDOSCOPY N/A 02/01/2022   Procedure: DRUG INDUCED SPLEEP ENDOSCOPY;  Surgeon: Carlie Clark, MD;  Location: [facility];  Service: ENT;  Laterality: N/A;   HEMATOMA EVACUATION Right 04/28/2022   Procedure: EVACUATION HEMATOMA OF NECK;  Surgeon: Carlie Clark, MD;  Location: [facility] OR;  Service: ENT;  Laterality: Right;   IMPLANTATION OF HYPOGLOSSAL NERVE STIMULATOR Right 04/26/2022   Procedure: IMPLANTATION OF HYPOGLOSSAL NERVE STIMULATOR;  Surgeon: Carlie Clark, MD;  Location: [facility] OR;  Service: ENT;  Laterality: Right;   UMBILICAL HERNIA REPAIR     Patient Active Problem List   Diagnosis Date Noted   Sprain of unspecified acromioclavicular joint, initial encounter 08/27/2024   Partial tear of rotator cuff 08/27/2024   Prediabetes  06/04/2024   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   Peroneal tendinitis of left lower extremity 01/25/2024   Dyslipidemia 04/10/2023   Essential hypertension 04/06/2023   Effusion of right knee 01/03/2023   Degenerative disc disease, cervical 11/02/2021   Right lateral epicondylitis 09/07/2021   Screening for malignant neoplasm of prostate 06/01/2021   Hyperglycemia 06/01/2021   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   OSA (obstructive sleep apnea) 03/15/2021   Allergic rhinitis 03/15/2021   Anxiety 03/15/2021   Obesity 03/15/2021   Vitamin D  deficiency 01/29/2021   Low testosterone  01/29/2021   Bilateral shoulder pain 12/23/2020   Nonallopathic lesion of rib cage 07/02/2018   Nonallopathic lesion of cervical region 07/02/2018   SI (sacroiliac) joint dysfunction 12/21/2015   Paronychia of third finger of left hand 08/03/2015   Lumbar back pain 12/22/2014   Morton's neuroma of left foot 12/22/2014   Nonallopathic lesion of lumbosacral region 12/22/2014   Nonallopathic lesion of sacral region 12/22/2014   Nonallopathic lesion of thoracic region 12/22/2014    PCP: Kennyth Worth HERO, MD  REFERRING PROVIDER: Claudene Arthea HERO, DO  REFERRING DIAG: Pain in joint of right shoulder  THERAPY DIAG:  Chronic right shoulder pain  Muscle weakness (generalized)  Rationale for  Evaluation and Treatment: Rehabilitation  ONSET DATE: Chronic, about 3 weeks ago   SUBJECTIVE:            SUBJECTIVE STATEMENT: Patient reports he fell while walking on a newly landscaped are and fell on the right shoulder on pavement. Doctor appointment confirmed some tears in the rotator cuff. Pain is primarily at night. He has avoided overhead lifting motions at the gym. Pain can come and go and sometimes be very intense, and the pain doesn't always happen with certain movements.   Hand dominance: Right  PERTINENT HISTORY: See PMH above  PAIN:  Are you having pain? Yes:  NPRS scale: 7/10 currently, 9/10 at  worst Pain location: Right shoulder Pain description: Deep achy Aggravating factors: Nocturnal pain, shoulder activity Relieving factors: Medication, voltaren   PRECAUTIONS: None  RED FLAGS: None   WEIGHT BEARING RESTRICTIONS: No  FALLS:  Has patient fallen in last 6 months? Yes. Number of falls 1  PLOF: Independent  PATIENT GOALS: Pain relief   OBJECTIVE:  Note: Objective measures were completed at Evaluation unless otherwise noted. PATIENT SURVEYS:  QuickDASH: 20.5% disability  COGNITION: Overall cognitive status: Within functional limits for tasks assessed     SENSATION: WFL  POSTURE: Rounded shoulder and forward head posture  UPPER EXTREMITY ROM:   Active ROM Right eval Left eval  Shoulder flexion 170   Shoulder extension    Shoulder abduction 175   Shoulder adduction    Shoulder internal rotation T12   Shoulder external rotation 70 / T4   Elbow flexion    Elbow extension    Wrist flexion    Wrist extension    Wrist ulnar deviation    Wrist radial deviation    Wrist pronation    Wrist supination    (Blank rows = not tested)  Pain reported with functional IR reach behind back  UPPER EXTREMITY MMT:  MMT Right eval Left eval  Shoulder flexion 4 5  Shoulder extension 5 5  Shoulder abduction 4 5  Shoulder adduction    Shoulder internal rotation 5 5  Shoulder external rotation 5 5  Middle trapezius    Lower trapezius    Elbow flexion    Elbow extension    Wrist flexion    Wrist extension    Wrist ulnar deviation    Wrist radial deviation    Wrist pronation    Wrist supination    Grip strength (lbs)    (Blank rows = not tested)  JOINT MOBILITY TESTING:  Grossly WFL  PALPATION:  Tender to palpation right posterior cuff region, deltoid, and bicipital groove region                                                                                                                             TREATMENT [facility] Adult PT Treatment:  DATE: 09/03/2024 STM right posterior cuff, upper trap and deltoid region Discussed previously provided rotator cuff strengthening HEP Standing scaption with red x 10  Trigger Point Dry Needling  Initial Treatment: Pt instructed on Dry Needling rational, procedures, and possible side effects. Pt instructed to expect mild to moderate muscle soreness later in the day and/or into the next day.  Pt instructed in methods to reduce muscle soreness. Pt instructed to continue prescribed HEP. Because Dry Needling was performed over or adjacent to a lung field, pt was educated on S/S of pneumothorax and to seek immediate medical attention should they occur.  Patient was educated on signs and symptoms of infection and other risk factors and advised to seek medical attention should they occur.  Patient verbalized understanding of these instructions and education.   Patient Verbal Consent Given: Yes Education Handout Provided: No patient declined Muscles Treated: Right infraspinatus, supraspinatus, upper trap, posterior and middle deltoid Electrical Stimulation Performed: No Treatment Response/Outcome: Twitch response   PATIENT EDUCATION: Education details: Exam findings, POC, HEP, TPDN Person educated: Patient Education method: Explanation, Demonstration, Tactile cues, Verbal cues, and Handouts Education comprehension: verbalized understanding, returned demonstration, verbal cues required, tactile cues required, and needs further education  HOME EXERCISE PROGRAM: Access Code: [email or web address]   ASSESSMENT: CLINICAL IMPRESSION: Patient is a 68 y.o. male who was seen today for physical therapy evaluation and treatment for right shoulder pain following fall. He exhibits primarily limitation with his motion reaching behind his back and does have some strength deficits of the right shoulder. He reports pain primarily at night that can wake him from sleep, and occasional with  shoulder activities that can vary. Performed TPDN this visit for the right shoulder region with good therapeutic benefit and provided progression for rotator cuff strengthening.   OBJECTIVE IMPAIRMENTS: decreased activity tolerance, decreased ROM, decreased strength, postural dysfunction, and pain.   ACTIVITY LIMITATIONS: lifting, sleeping, bathing, and dressing  PARTICIPATION LIMITATIONS: community activity  PERSONAL FACTORS: Fitness, Past/current experiences, and Time since onset of injury/illness/exacerbation are also affecting patient's functional outcome.   REHAB POTENTIAL: Good  CLINICAL DECISION MAKING: Stable/uncomplicated  EVALUATION COMPLEXITY: Low   GOALS: Goals reviewed with patient? Yes  SHORT TERM GOALS: Target date: 10/01/2024  Patient will be I with initial HEP in order to progress with therapy. Baseline: HEP provided at eval Goal status: INITIAL  2.  Patient will demonstrate right shoulder functional IR with pain or tightness to improve dressing and bathing ability Baseline: see limitations above Goal status: INITIAL  LONG TERM GOALS: Target date: 10/29/2024  Patient will be I with final HEP to maintain progress from PT. Baseline: HEP provided at eval Goal status: INITIAL  2.  Patient will report QuickDASH </= 10% in order to indicate an improvement in their functional status Baseline: 20.5% Goal status: INITIAL  3.  Patient will demonstrate right shoulder strength 5/5 MMT in order to reduce functional limitations with household tasks and exercising at gym Baseline: see limitations above Goal status: INITIAL   PLAN: PT FREQUENCY: 1x/week  PT DURATION: 8 weeks  PLANNED INTERVENTIONS: 97164- PT Re-evaluation, 97750- Physical Performance Testing, 97110-Therapeutic exercises, 97530- Therapeutic activity, 97112- Neuromuscular re-education, 97535- Self Care, 02859- Manual therapy, 20560 (1-2 muscles), 20561 (3+ muscles)- Dry Needling, Patient/Family  education, Joint mobilization, Joint manipulation, Spinal manipulation, Spinal mobilization, Cryotherapy, and Moist heat  PLAN FOR NEXT SESSION: Review HEP and progress PRN, manual/TPDN for right shoulder, progress rotator cuff strengthening    Elaine Daring, PT, DPT, LAT, ATC 09/03/24  4:19  PM Phone: [phone] Fax: [phone]

## 2024-09-03 NOTE — Patient Instructions (Signed)
 Access Code: [email or web address] URL: [email or web address] Date: 09/03/2024 Prepared by: Elaine Daring  Exercises - Single Arm Scaption with Resistance  - 3 x weekly - 3 sets - 10 reps

## 2024-09-19 ENCOUNTER — Ambulatory Visit: Admitting: Physical Therapy

## 2024-09-19 ENCOUNTER — Encounter: Payer: Self-pay | Admitting: Physical Therapy

## 2024-09-19 ENCOUNTER — Other Ambulatory Visit: Payer: Self-pay

## 2024-09-19 DIAGNOSIS — G8929 Other chronic pain: Secondary | ICD-10-CM | POA: Diagnosis not present

## 2024-09-19 DIAGNOSIS — M6281 Muscle weakness (generalized): Secondary | ICD-10-CM

## 2024-09-19 DIAGNOSIS — M25511 Pain in right shoulder: Secondary | ICD-10-CM

## 2024-09-19 NOTE — Therapy (Signed)
 OUTPATIENT PHYSICAL THERAPY TREATMENT   Patient Name: Saunders Arlington MRN: [id number] DOB:10-Jul-1956, 68 y.o., male Today's Date: 09/19/2024   END OF SESSION:  PT End of Session - 09/19/24 1041     Visit Number 2    Number of Visits 8    Date for Recertification  10/29/24    Authorization Type [employer]    PT Start Time 0900    PT Stop Time 0940    PT Time Calculation (min) 40 min    Activity Tolerance Patient tolerated treatment well    Behavior During Therapy WFL for tasks assessed/performed           Past Medical History:  Diagnosis Date   Allergy    Anemia    as a kid per patient   Anxiety    Arthritis    Back pain    Depression    Food allergy    Hyperlipidemia    Hypertension    white coat syndrome per patient   Lactose intolerance    Metabolic syndrome    per patient- seeing weight loss MD for this   Morton's neuroma    Obesity    Pneumonia    walking pneumonia 16-17 yr ago   Pre-diabetes    history of pre-DM- out of zone d/t weight loss   Sleep apnea    uses CPAP   Past Surgical History:  Procedure Laterality Date   APPENDECTOMY     COLONOSCOPY  2010   Dr.Medoff  normal per pt   DRUG INDUCED ENDOSCOPY N/A 02/01/2022   Procedure: DRUG INDUCED SPLEEP ENDOSCOPY;  Surgeon: Carlie Clark, MD;  Location: [facility];  Service: ENT;  Laterality: N/A;   HEMATOMA EVACUATION Right 04/28/2022   Procedure: EVACUATION HEMATOMA OF NECK;  Surgeon: Carlie Clark, MD;  Location: [facility] OR;  Service: ENT;  Laterality: Right;   IMPLANTATION OF HYPOGLOSSAL NERVE STIMULATOR Right 04/26/2022   Procedure: IMPLANTATION OF HYPOGLOSSAL NERVE STIMULATOR;  Surgeon: Carlie Clark, MD;  Location: [facility] OR;  Service: ENT;  Laterality: Right;   UMBILICAL HERNIA REPAIR     Patient Active Problem List   Diagnosis Date Noted   Sprain of unspecified acromioclavicular joint, initial encounter 08/27/2024   Partial tear of rotator cuff 08/27/2024   Prediabetes  06/04/2024   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   Peroneal tendinitis of left lower extremity 01/25/2024   Dyslipidemia 04/10/2023   Essential hypertension 04/06/2023   Effusion of right knee 01/03/2023   Degenerative disc disease, cervical 11/02/2021   Right lateral epicondylitis 09/07/2021   Screening for malignant neoplasm of prostate 06/01/2021   Hyperglycemia 06/01/2021   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   OSA (obstructive sleep apnea) 03/15/2021   Allergic rhinitis 03/15/2021   Anxiety 03/15/2021   Obesity 03/15/2021   Vitamin D  deficiency 01/29/2021   Low testosterone  01/29/2021   Bilateral shoulder pain 12/23/2020   Nonallopathic lesion of rib cage 07/02/2018   Nonallopathic lesion of cervical region 07/02/2018   SI (sacroiliac) joint dysfunction 12/21/2015   Paronychia of third finger of left hand 08/03/2015   Lumbar back pain 12/22/2014   Morton's neuroma of left foot 12/22/2014   Nonallopathic lesion of lumbosacral region 12/22/2014   Nonallopathic lesion of sacral region 12/22/2014   Nonallopathic lesion of thoracic region 12/22/2014    PCP: Kennyth Worth HERO, MD  REFERRING PROVIDER: Claudene Arthea HERO, DO  REFERRING DIAG: Pain in joint of right shoulder  THERAPY DIAG:  Chronic right shoulder pain  Muscle weakness (generalized)  Rationale  for Evaluation and Treatment: Rehabilitation  ONSET DATE: Chronic, about 3 weeks ago   SUBJECTIVE:            SUBJECTIVE STATEMENT: Patient reports improvement since last visit but still feels tension on the outside of the shoulder.  Eval: Patient reports he fell while walking on a newly landscaped are and fell on the right shoulder on pavement. Doctor appointment confirmed some tears in the rotator cuff. Pain is primarily at night. He has avoided overhead lifting motions at the gym. Pain can come and go and sometimes be very intense, and the pain doesn't always happen with certain movements.   Hand dominance:  Right  PERTINENT HISTORY: See PMH above  PAIN:  Are you having pain? Yes:  NPRS scale: 5/10 currently, 9/10 at worst Pain location: Right shoulder Pain description: Deep achy Aggravating factors: Nocturnal pain, shoulder activity Relieving factors: Medication, voltaren   PRECAUTIONS: None  RED FLAGS: None   WEIGHT BEARING RESTRICTIONS: No  FALLS:  Has patient fallen in last 6 months? Yes. Number of falls 1  PLOF: Independent  PATIENT GOALS: Pain relief   OBJECTIVE:  Note: Objective measures were completed at Evaluation unless otherwise noted. PATIENT SURVEYS:  QuickDASH: 20.5% disability  POSTURE: Rounded shoulder and forward head posture  UPPER EXTREMITY ROM:   Active ROM Right eval Left eval Rt / Lt 09/19/2024  Shoulder flexion 170  175 / 175  Shoulder extension     Shoulder abduction 175    Shoulder adduction     Shoulder internal rotation T12    Shoulder external rotation 70 / T4    Elbow flexion     Elbow extension     Wrist flexion     Wrist extension     Wrist ulnar deviation     Wrist radial deviation     Wrist pronation     Wrist supination     (Blank rows = not tested)  Pain reported with functional IR reach behind back  UPPER EXTREMITY MMT:  MMT Right eval Left eval  Shoulder flexion 4 5  Shoulder extension 5 5  Shoulder abduction 4 5  Shoulder adduction    Shoulder internal rotation 5 5  Shoulder external rotation 5 5  Middle trapezius    Lower trapezius    Elbow flexion    Elbow extension    Wrist flexion    Wrist extension    Wrist ulnar deviation    Wrist radial deviation    Wrist pronation    Wrist supination    Grip strength (lbs)    (Blank rows = not tested)  JOINT MOBILITY TESTING:  Grossly WFL  PALPATION:  Tender to palpation right posterior cuff region, deltoid, and bicipital groove region                                                                                                                              TREATMENT [facility] Adult PT Treatment:  DATE: 09/19/2024 STM right posterior cuff and deltoid region Sleeper stretch 3 x 15 sec right Supine serratus punch with 5# 2 x 10 right Sidelying shoulder ER with 2# 3 x 10 right Scaption with 2# 2 x 10 Bent over row with 20# 2 x 10 right  Trigger Point Dry Needling  Subsequent Treatment: Instructions provided previously at initial dry needling treatment.  Instructions reviewed, if requested by the patient, prior to subsequent dry needling treatment.   Patient Verbal Consent Given: Yes Education Handout Provided: No patient declined Muscles Treated: Right infraspinatus and deltoid Electrical Stimulation Performed: No Treatment Response/Outcome: Twitch response   PATIENT EDUCATION: Education details: HEP, TPDN Person educated: Patient Education method: Explanation, Demonstration, Tactile cues, Verbal cues, and Handouts Education comprehension: verbalized understanding, returned demonstration, verbal cues required, tactile cues required, and needs further education  HOME EXERCISE PROGRAM: Access Code: [email or web address]   ASSESSMENT: CLINICAL IMPRESSION: Patient tolerated therapy well with no adverse effects. Continued with TPDN for the right shoulder with good therapeutic benefit. Incorporated some posterior cuff stretching and continued with rotator cuff and periscapular strengthening with good tolerance. He does exhibit improvement in his active shoulder flexion this visit but does continue to report some tension of the right shoulder. Updated his HEP to include sleeper stretch with good tolerance. Patient would benefit from continued skilled PT to progress mobility and strength in order to reduce pain and maximize functional ability.   Eval: Patient is a 68 y.o. male who was seen today for physical therapy evaluation and treatment for right shoulder pain following fall. He exhibits primarily limitation  with his motion reaching behind his back and does have some strength deficits of the right shoulder. He reports pain primarily at night that can wake him from sleep, and occasional with shoulder activities that can vary. Performed TPDN this visit for the right shoulder region with good therapeutic benefit and provided progression for rotator cuff strengthening.   OBJECTIVE IMPAIRMENTS: decreased activity tolerance, decreased ROM, decreased strength, postural dysfunction, and pain.   ACTIVITY LIMITATIONS: lifting, sleeping, bathing, and dressing  PARTICIPATION LIMITATIONS: community activity  PERSONAL FACTORS: Fitness, Past/current experiences, and Time since onset of injury/illness/exacerbation are also affecting patient's functional outcome.    GOALS: Goals reviewed with patient? Yes  SHORT TERM GOALS: Target date: 10/01/2024  Patient will be I with initial HEP in order to progress with therapy. Baseline: HEP provided at eval Goal status: INITIAL  2.  Patient will demonstrate right shoulder functional IR with pain or tightness to improve dressing and bathing ability Baseline: see limitations above Goal status: INITIAL  LONG TERM GOALS: Target date: 10/29/2024  Patient will be I with final HEP to maintain progress from PT. Baseline: HEP provided at eval Goal status: INITIAL  2.  Patient will report QuickDASH </= 10% in order to indicate an improvement in their functional status Baseline: 20.5% Goal status: INITIAL  3.  Patient will demonstrate right shoulder strength 5/5 MMT in order to reduce functional limitations with household tasks and exercising at gym Baseline: see limitations above Goal status: INITIAL   PLAN: PT FREQUENCY: 1x/week  PT DURATION: 8 weeks  PLANNED INTERVENTIONS: 97164- PT Re-evaluation, 97750- Physical Performance Testing, 97110-Therapeutic exercises, 97530- Therapeutic activity, 97112- Neuromuscular re-education, 97535- Self Care, 02859- Manual  therapy, 20560 (1-2 muscles), 20561 (3+ muscles)- Dry Needling, Patient/Family education, Joint mobilization, Joint manipulation, Spinal manipulation, Spinal mobilization, Cryotherapy, and Moist heat  PLAN FOR NEXT SESSION: Review HEP and progress PRN, manual/TPDN for right shoulder, progress rotator cuff  strengthening    Elaine Daring, PT, DPT, LAT, ATC 09/19/24  10:44 AM Phone: [phone] Fax: [phone]

## 2024-09-19 NOTE — Patient Instructions (Signed)
 Access Code: [email or web address] URL: [email or web address] Date: 09/19/2024 Prepared by: Elaine Daring  Exercises - Sleeper Stretch  - 1-2 x daily - 3 reps - 20 seconds hold - Single Arm Scaption with Resistance  - 3 x weekly - 3 sets - 10 reps

## 2024-09-25 NOTE — Progress Notes (Addendum)
 [facility] [address] [place] [address] Phone: [phone] Subjective:   [facility] Lucas Young am a scribe for Dr. Claudene..   I'Young seeing this patient by the request  of:  Kennyth Worth HERO, MD  CC: Back and neck pain  YEP:Dlagzrupcz  Lucas Young is a 68 y.o. male coming in with complaint of back and neck pain. OMT 08/27/2024. Also f/u for R shoulder pain.  Been in physical therapy.  Patient states that the right shoulder is sore. It is not as bad as it has been better. Been getting the dry needling from Dr. Elaine. Back and neck are stiff.   Medications patient has been prescribed: N Patches  Taking: no (ran out of them)         Reviewed prior external information including notes and imaging from previsou exam, outside providers and external EMR if available.   As well as notes that were available from care everywhere and other healthcare systems.  Past medical history, social, surgical and family history all reviewed in electronic medical record.  No pertanent information unless stated regarding to the chief complaint.   Past Medical History:  Diagnosis Date   Allergy    Anemia    as a kid per patient   Anxiety    Arthritis    Back pain    Depression    Food allergy    Hyperlipidemia    Hypertension    white coat syndrome per patient   Lactose intolerance    Metabolic syndrome    per patient- seeing weight loss MD for this   Morton's neuroma    Obesity    Pneumonia    walking pneumonia 16-17 yr ago   Pre-diabetes    history of pre-DM- out of zone d/t weight loss   Sleep apnea    uses CPAP    Allergies  Allergen Reactions   Grass Pollen([id number]-Swt Vern) Other (See Comments)   Molds & Smuts Other (See Comments)   Pollen Extract    Thimerosal (Thiomersal) Itching    Eye redness      Review of Systems:  No headache, visual changes, nausea, vomiting, diarrhea, constipation, dizziness, abdominal  pain, skin rash, fevers, chills, night sweats, weight loss, swollen lymph nodes, body aches, joint swelling, chest pain, shortness of breath, mood changes. POSITIVE muscle aches  Objective  Blood pressure (!) 138/90, pulse 74, height 5' 6 (1.676 Young), SpO2 95%.   General: No apparent distress alert and oriented x3 mood and affect normal, dressed appropriately.  HEENT: Pupils equal, extraocular movements intact  Respiratory: Patient's speak in full sentences and does not appear short of breath  Cardiovascular: No lower extremity edema, non tender, no erythema  Gait MSK:  Back low back does have some loss of lordosis.  Some tenderness to palpation in the paraspinal musculature.  Right shoulder positive impingement noted.  Positive crossover noted.  After informed written and verbal consent, patient was seated on exam table. Right shoulder was prepped with alcohol swab and utilizing posterior approach, patient's right glenohumeral space was injected with 4:1  marcaine 0.5%: Depo-Medrol  40mg /dL. Patient tolerated the procedure well without immediate complications.  After verbal consent patient with alcohol swab and with a 25-gauge half inch needle injected into the right acromioclavicular joint with a total of 5 cc 0.5% Marcaine and 0.5 cc of depomedrol 40 mg/mL.  No blood loss.  Band-Aid placed.  Postinjection instructions given  Osteopathic findings  C2 flexed rotated  and side bent right C6 flexed rotated and side bent left T3 extended rotated and side bent right inhaled rib T9 extended rotated and side bent left L2 flexed rotated and side bent right Sacrum right on right       Assessment and Plan:  Partial tear of rotator cuff Patient given injection and tolerated the procedure well, discussed icing regimen and home exercises, discussed which activities to do and which ones to avoid.  Increase activity slowly.  Follow-up again in 6 to 12 weeks continue with physical therapy.  Sprain  of unspecified acromioclavicular joint, initial encounter Discussed with patient about this again.  Hopeful that this will make a significant progress.  With patient not having as much improvement to see how he does.  Discussed continuing conservative therapy with physical therapy.  Follow-up again in 6 to 12 weeks.  Degenerative disc disease, cervical Tightness in the neck, some of it is likely some I have aggravation of the underlying arthritic changes but also patient has been compensating for the right shoulder.  Discussed icing regimen and home exercises, increase activity slowly.  Increase activity slowly.  Discussed posture and ergonomics.  Follow-up again in 6 to 8 weeks    Nonallopathic problems  Decision today to treat with OMT was based on Physical Exam  After verbal consent patient was treated with HVLA, ME, FPR techniques in cervical, rib, thoracic, lumbar, and sacral  areas  Patient tolerated the procedure well with improvement in symptoms  Patient given exercises, stretches and lifestyle modifications  See medications in patient instructions if given  Patient will follow up in 4-8 weeks    The above documentation has been reviewed and is accurate and complete Lucas Young Lucas Bach, DO          Note: This dictation was prepared with Dragon dictation along with smaller phrase technology. Any transcriptional errors that result from this process are unintentional.

## 2024-09-27 ENCOUNTER — Other Ambulatory Visit: Payer: Self-pay

## 2024-09-27 ENCOUNTER — Ambulatory Visit: Admitting: Family Medicine

## 2024-09-27 ENCOUNTER — Encounter: Payer: Self-pay | Admitting: Physical Therapy

## 2024-09-27 ENCOUNTER — Encounter: Payer: Self-pay | Admitting: Family Medicine

## 2024-09-27 ENCOUNTER — Ambulatory Visit: Admitting: Physical Therapy

## 2024-09-27 VITALS — BP 138/90 | HR 74 | Ht 66.0 in

## 2024-09-27 DIAGNOSIS — M9904 Segmental and somatic dysfunction of sacral region: Secondary | ICD-10-CM

## 2024-09-27 DIAGNOSIS — M5030 Other cervical disc degeneration, unspecified cervical region: Secondary | ICD-10-CM | POA: Diagnosis not present

## 2024-09-27 DIAGNOSIS — M75111 Incomplete rotator cuff tear or rupture of right shoulder, not specified as traumatic: Secondary | ICD-10-CM | POA: Diagnosis not present

## 2024-09-27 DIAGNOSIS — M9901 Segmental and somatic dysfunction of cervical region: Secondary | ICD-10-CM

## 2024-09-27 DIAGNOSIS — S4350XA Sprain of unspecified acromioclavicular joint, initial encounter: Secondary | ICD-10-CM

## 2024-09-27 DIAGNOSIS — M9902 Segmental and somatic dysfunction of thoracic region: Secondary | ICD-10-CM | POA: Diagnosis not present

## 2024-09-27 DIAGNOSIS — M9908 Segmental and somatic dysfunction of rib cage: Secondary | ICD-10-CM | POA: Diagnosis not present

## 2024-09-27 DIAGNOSIS — G8929 Other chronic pain: Secondary | ICD-10-CM | POA: Diagnosis not present

## 2024-09-27 DIAGNOSIS — M6281 Muscle weakness (generalized): Secondary | ICD-10-CM | POA: Diagnosis not present

## 2024-09-27 DIAGNOSIS — M9903 Segmental and somatic dysfunction of lumbar region: Secondary | ICD-10-CM

## 2024-09-27 DIAGNOSIS — M25511 Pain in right shoulder: Secondary | ICD-10-CM | POA: Diagnosis not present

## 2024-09-27 MED ORDER — METHYLPREDNISOLONE ACETATE 40 MG/ML IJ SUSP
60.0000 mg | Freq: Once | INTRAMUSCULAR | Status: AC
  Administered 2024-09-27: 60 mg via INTRA_ARTICULAR

## 2024-09-27 NOTE — Assessment & Plan Note (Signed)
 Discussed with patient about this again.  Hopeful that this will make a significant progress.  With patient not having as much improvement to see how he does.  Discussed continuing conservative therapy with physical therapy.  Follow-up again in 6 to 12 weeks.

## 2024-09-27 NOTE — Assessment & Plan Note (Signed)
 Patient given injection and tolerated the procedure well, discussed icing regimen and home exercises, discussed which activities to do and which ones to avoid.  Increase activity slowly.  Follow-up again in 6 to 12 weeks continue with physical therapy.

## 2024-09-27 NOTE — Patient Instructions (Signed)
 Good to see you. Injected shoulder today.  See me again in 2 to 3 months.

## 2024-09-27 NOTE — Therapy (Signed)
 OUTPATIENT PHYSICAL THERAPY TREATMENT   Patient Name: Lucas Young MRN: [id number] DOB:[birth year], 68 y.o., male Today's Date: 09/27/2024   END OF SESSION:  PT End of Session - 09/27/24 0853     Visit Number 3    Number of Visits 8    Date for Recertification  10/29/24    Authorization Type [employer]    PT Start Time [id number]    PT Stop Time 0930    PT Time Calculation (min) 44 min    Activity Tolerance Patient tolerated treatment well    Behavior During Therapy WFL for tasks assessed/performed            Past Medical History:  Diagnosis Date   Allergy    Anemia    as a kid per patient   Anxiety    Arthritis    Back pain    Depression    Food allergy    Hyperlipidemia    Hypertension    white coat syndrome per patient   Lactose intolerance    Metabolic syndrome    per patient- seeing weight loss MD for this   Morton's neuroma    Obesity    Pneumonia    walking pneumonia 16-17 yr ago   Pre-diabetes    history of pre-DM- out of zone d/t weight loss   Sleep apnea    uses CPAP   Past Surgical History:  Procedure Laterality Date   APPENDECTOMY     COLONOSCOPY  2010   Dr.Medoff  normal per pt   DRUG INDUCED ENDOSCOPY N/A 02/01/2022   Procedure: DRUG INDUCED SPLEEP ENDOSCOPY;  Surgeon: Carlie Clark, MD;  Location: [facility];  Service: ENT;  Laterality: N/A;   HEMATOMA EVACUATION Right 04/28/2022   Procedure: EVACUATION HEMATOMA OF NECK;  Surgeon: Carlie Clark, MD;  Location: [facility] OR;  Service: ENT;  Laterality: Right;   IMPLANTATION OF HYPOGLOSSAL NERVE STIMULATOR Right 04/26/2022   Procedure: IMPLANTATION OF HYPOGLOSSAL NERVE STIMULATOR;  Surgeon: Carlie Clark, MD;  Location: [facility] OR;  Service: ENT;  Laterality: Right;   UMBILICAL HERNIA REPAIR     Patient Active Problem List   Diagnosis Date Noted   Sprain of unspecified acromioclavicular joint, initial encounter 08/27/2024   Partial tear of rotator cuff 08/27/2024   Prediabetes  06/04/2024   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   Peroneal tendinitis of left lower extremity 01/25/2024   Dyslipidemia 04/10/2023   Essential hypertension 04/06/2023   Effusion of right knee 01/03/2023   Degenerative disc disease, cervical 11/02/2021   Right lateral epicondylitis 09/07/2021   Screening for malignant neoplasm of prostate 06/01/2021   Hyperglycemia 06/01/2021   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   OSA (obstructive sleep apnea) 03/15/2021   Allergic rhinitis 03/15/2021   Anxiety 03/15/2021   Obesity 03/15/2021   Vitamin D  deficiency 01/29/2021   Low testosterone  01/29/2021   Bilateral shoulder pain 12/23/2020   Nonallopathic lesion of rib cage 07/02/2018   Nonallopathic lesion of cervical region 07/02/2018   SI (sacroiliac) joint dysfunction 12/21/2015   Paronychia of third finger of left hand 08/03/2015   Lumbar back pain 12/22/2014   Morton's neuroma of left foot 12/22/2014   Nonallopathic lesion of lumbosacral region 12/22/2014   Nonallopathic lesion of sacral region 12/22/2014   Nonallopathic lesion of thoracic region 12/22/2014    PCP: Kennyth Worth HERO, MD  REFERRING PROVIDER: Claudene Arthea HERO, DO  REFERRING DIAG: Pain in joint of right shoulder  THERAPY DIAG:  Chronic right shoulder pain  Muscle weakness (generalized)  Rationale for Evaluation and Treatment: Rehabilitation  ONSET DATE: Chronic, about 3 weeks ago   SUBJECTIVE:            SUBJECTIVE STATEMENT: Patient reports shoulder is better than before but not great. He did just get an injection in the right shoulder.   Eval: Patient reports he fell while walking on a newly landscaped are and fell on the right shoulder on pavement. Doctor appointment confirmed some tears in the rotator cuff. Pain is primarily at night. He has avoided overhead lifting motions at the gym. Pain can come and go and sometimes be very intense, and the pain doesn't always happen with certain movements.   Hand dominance:  Right  PERTINENT HISTORY: See PMH above  PAIN:  Are you having pain? Yes:  NPRS scale: 5/10 currently, 9/10 at worst Pain location: Right shoulder Pain description: Deep achy Aggravating factors: Nocturnal pain, shoulder activity Relieving factors: Medication, voltaren   PRECAUTIONS: None  RED FLAGS: None   WEIGHT BEARING RESTRICTIONS: No  FALLS:  Has patient fallen in last 6 months? Yes. Number of falls 1  PLOF: Independent  PATIENT GOALS: Pain relief   OBJECTIVE:  Note: Objective measures were completed at Evaluation unless otherwise noted. PATIENT SURVEYS:  QuickDASH: 20.5% disability  POSTURE: Rounded shoulder and forward head posture  UPPER EXTREMITY ROM:   Active ROM Right eval Left eval Rt / Lt 09/19/2024  Shoulder flexion 170  175 / 175  Shoulder extension     Shoulder abduction 175    Shoulder adduction     Shoulder internal rotation T12    Shoulder external rotation 70 / T4    Elbow flexion     Elbow extension     Wrist flexion     Wrist extension     Wrist ulnar deviation     Wrist radial deviation     Wrist pronation     Wrist supination     (Blank rows = not tested)  Pain reported with functional IR reach behind back  UPPER EXTREMITY MMT:  MMT Right eval Left eval  Shoulder flexion 4 5  Shoulder extension 5 5  Shoulder abduction 4 5  Shoulder adduction    Shoulder internal rotation 5 5  Shoulder external rotation 5 5  Middle trapezius    Lower trapezius    Elbow flexion    Elbow extension    Wrist flexion    Wrist extension    Wrist ulnar deviation    Wrist radial deviation    Wrist pronation    Wrist supination    Grip strength (lbs)    (Blank rows = not tested)  JOINT MOBILITY TESTING:  Grossly WFL  PALPATION:  Tender to palpation right posterior cuff region, deltoid, and bicipital groove region  TREATMENT [facility] Adult PT Treatment:                                                DATE: 09/27/2024 STM right posterior cuff and deltoid region Right GHJ mobs primarily posterior and inferior at various ranges of elevation Supine serratus punch with 5# 2 x 15 right Bent over row with 20# 3 x 10 right Standing shoulder ER with green 3 x 10 right Scaption with 3# 3 x 10 Wall ball circles cw/ccw 2 x 20 right  Trigger Point Dry Needling  Subsequent Treatment: Instructions provided previously at initial dry needling treatment.  Instructions reviewed, if requested by the patient, prior to subsequent dry needling treatment.   Patient Verbal Consent Given: Yes Education Handout Provided: No patient declined Muscles Treated: Right infraspinatus and deltoid Electrical Stimulation Performed: No Treatment Response/Outcome: Twitch response   PATIENT EDUCATION: Education details: HEP, TPDN Person educated: Patient Education method: Explanation, Demonstration, Tactile cues, Verbal cues Education comprehension: verbalized understanding, returned demonstration, verbal cues required, tactile cues required, and needs further education  HOME EXERCISE PROGRAM: Access Code: [email or web address]   ASSESSMENT: CLINICAL IMPRESSION: Patient tolerated therapy well with no adverse effects. Continued with TPDN for the right shoulder with good therapeutic benefit. Therapy focused on improving shoulder mobility and strength with good tolerance. He does not report any increased pain with exercises in therapy. He did just have an injection in the right shoulder so hopefully this will help with his pain sleeping. No changes to his HEP and discussed gym strengthening. Patient would benefit from continued skilled PT to progress mobility and strength in order to reduce pain and maximize functional ability.   Eval: Patient is a 69 y.o. male who was seen today for physical therapy evaluation and treatment for right shoulder  pain following fall. He exhibits primarily limitation with his motion reaching behind his back and does have some strength deficits of the right shoulder. He reports pain primarily at night that can wake him from sleep, and occasional with shoulder activities that can vary. Performed TPDN this visit for the right shoulder region with good therapeutic benefit and provided progression for rotator cuff strengthening.   OBJECTIVE IMPAIRMENTS: decreased activity tolerance, decreased ROM, decreased strength, postural dysfunction, and pain.   ACTIVITY LIMITATIONS: lifting, sleeping, bathing, and dressing  PARTICIPATION LIMITATIONS: community activity  PERSONAL FACTORS: Fitness, Past/current experiences, and Time since onset of injury/illness/exacerbation are also affecting patient's functional outcome.    GOALS: Goals reviewed with patient? Yes  SHORT TERM GOALS: Target date: 10/01/2024  Patient will be I with initial HEP in order to progress with therapy. Baseline: HEP provided at eval Goal status: INITIAL  2.  Patient will demonstrate right shoulder functional IR with pain or tightness to improve dressing and bathing ability Baseline: see limitations above Goal status: INITIAL  LONG TERM GOALS: Target date: 10/29/2024  Patient will be I with final HEP to maintain progress from PT. Baseline: HEP provided at eval Goal status: INITIAL  2.  Patient will report QuickDASH </= 10% in order to indicate an improvement in their functional status Baseline: 20.5% Goal status: INITIAL  3.  Patient will demonstrate right shoulder strength 5/5 MMT in order to reduce functional limitations with household tasks and exercising at gym Baseline: see limitations above Goal status: INITIAL   PLAN: PT FREQUENCY: 1x/week  PT DURATION: 8  weeks  PLANNED INTERVENTIONS: 97164- PT Re-evaluation, 97750- Physical Performance Testing, 97110-Therapeutic exercises, 97530- Therapeutic activity, 97112-  Neuromuscular re-education, 97535- Self Care, 02859- Manual therapy, 20560 (1-2 muscles), 20561 (3+ muscles)- Dry Needling, Patient/Family education, Joint mobilization, Joint manipulation, Spinal manipulation, Spinal mobilization, Cryotherapy, and Moist heat  PLAN FOR NEXT SESSION: Review HEP and progress PRN, manual/TPDN for right shoulder, progress rotator cuff strengthening    Elaine Daring, PT, DPT, LAT, ATC 09/27/24  10:35 AM Phone: [phone] Fax: [phone]

## 2024-09-27 NOTE — Assessment & Plan Note (Signed)
 Tightness in the neck, some of it is likely some I have aggravation of the underlying arthritic changes but also patient has been compensating for the right shoulder.  Discussed icing regimen and home exercises, increase activity slowly.  Increase activity slowly.  Discussed posture and ergonomics.  Follow-up again in 6 to 8 weeks

## 2024-10-01 ENCOUNTER — Ambulatory Visit: Admitting: Physical Therapy

## 2024-10-01 ENCOUNTER — Encounter: Payer: Self-pay | Admitting: Physical Therapy

## 2024-10-01 ENCOUNTER — Other Ambulatory Visit: Payer: Self-pay

## 2024-10-01 DIAGNOSIS — G8929 Other chronic pain: Secondary | ICD-10-CM

## 2024-10-01 DIAGNOSIS — M6281 Muscle weakness (generalized): Secondary | ICD-10-CM | POA: Diagnosis not present

## 2024-10-01 DIAGNOSIS — M25511 Pain in right shoulder: Secondary | ICD-10-CM | POA: Diagnosis not present

## 2024-10-01 NOTE — Therapy (Addendum)
 " OUTPATIENT PHYSICAL THERAPY TREATMENT  DISCHARGE   Patient Name: Lucas Young MRN: [id number] DOB:Dec 23, 1955, 68 y.o., male Today's Date: 10/01/2024   END OF SESSION:  PT End of Session - 10/01/24 0801     Visit Number 4    Number of Visits 8    Date for Recertification  10/29/24    Authorization Type [employer]et[employer]    PT Start Time 0800    PT Stop Time 0845    PT Time Calculation (min) 45 min    Activity Tolerance Patient tolerated treatment well    Behavior During Therapy WFL for tasks assessed/performed             Past Medical History:  Diagnosis Date   Allergy    Anemia    as a kid per patient   Anxiety    Arthritis    Back pain    Depression    Food allergy    Hyperlipidemia    Hypertension    white coat syndrome per patient   Lactose intolerance    Metabolic syndrome    per patient- seeing weight loss MD for this   Morton's neuroma    Obesity    Pneumonia    walking pneumonia 16-17 yr ago   Pre-diabetes    history of pre-DM- out of zone d/t weight loss   Sleep apnea    uses CPAP   Past Surgical History:  Procedure Laterality Date   APPENDECTOMY     COLONOSCOPY  2010   Dr.Medoff  normal per pt   DRUG INDUCED ENDOSCOPY N/A 02/01/2022   Procedure: DRUG INDUCED SPLEEP ENDOSCOPY;  Surgeon: Carlie Clark, MD;  Location: [facility];  Service: ENT;  Laterality: N/A;   HEMATOMA EVACUATION Right 04/28/2022   Procedure: EVACUATION HEMATOMA OF NECK;  Surgeon: Carlie Clark, MD;  Location: [facility] OR;  Service: ENT;  Laterality: Right;   IMPLANTATION OF HYPOGLOSSAL NERVE STIMULATOR Right 04/26/2022   Procedure: IMPLANTATION OF HYPOGLOSSAL NERVE STIMULATOR;  Surgeon: Carlie Clark, MD;  Location: [facility] OR;  Service: ENT;  Laterality: Right;   UMBILICAL HERNIA REPAIR     Patient Active Problem List   Diagnosis Date Noted   Sprain of unspecified acromioclavicular joint, initial encounter 08/27/2024   Partial tear of rotator cuff 08/27/2024    Prediabetes 06/04/2024   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   Peroneal tendinitis of left lower extremity 01/25/2024   Dyslipidemia 04/10/2023   Essential hypertension 04/06/2023   Effusion of right knee 01/03/2023   Degenerative disc disease, cervical 11/02/2021   Right lateral epicondylitis 09/07/2021   Screening for malignant neoplasm of prostate 06/01/2021   Hyperglycemia 06/01/2021   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   OSA (obstructive sleep apnea) 03/15/2021   Allergic rhinitis 03/15/2021   Anxiety 03/15/2021   Obesity 03/15/2021   Vitamin D  deficiency 01/29/2021   Low testosterone  01/29/2021   Bilateral shoulder pain 12/23/2020   Nonallopathic lesion of rib cage 07/02/2018   Nonallopathic lesion of cervical region 07/02/2018   SI (sacroiliac) joint dysfunction 12/21/2015   Paronychia of third finger of left hand 08/03/2015   Lumbar back pain 12/22/2014   Morton's neuroma of left foot 12/22/2014   Nonallopathic lesion of lumbosacral region 12/22/2014   Nonallopathic lesion of sacral region 12/22/2014   Nonallopathic lesion of thoracic region 12/22/2014    PCP: Kennyth Worth HERO, MD  REFERRING PROVIDER: Claudene Arthea HERO, DO  REFERRING DIAG: Pain in joint of right shoulder  THERAPY DIAG:  Chronic right shoulder pain  Muscle weakness (generalized)  Rationale for Evaluation and Treatment: Rehabilitation  ONSET DATE: Chronic, about 3 weeks ago   SUBJECTIVE:            SUBJECTIVE STATEMENT: Patient reports his shoulder is feeling much better following the injections and needling. States that movement doesn't bother it too much but lying down seems to flare it up.  Eval: Patient reports he fell while walking on a newly landscaped are and fell on the right shoulder on pavement. Doctor appointment confirmed some tears in the rotator cuff. Pain is primarily at night. He has avoided overhead lifting motions at the gym. Pain can come and go and sometimes be very intense, and the  pain doesn't always happen with certain movements.   Hand dominance: Right  PERTINENT HISTORY: See PMH above  PAIN:  Are you having pain? Yes:  NPRS scale: 3/10 currently, 9/10 at worst Pain location: Right shoulder Pain description: Deep achy Aggravating factors: Nocturnal pain, shoulder activity Relieving factors: Medication, voltaren   PRECAUTIONS: None  RED FLAGS: None   WEIGHT BEARING RESTRICTIONS: No  FALLS:  Has patient fallen in last 6 months? Yes. Number of falls 1  PLOF: Independent  PATIENT GOALS: Pain relief   OBJECTIVE:  Note: Objective measures were completed at Evaluation unless otherwise noted. PATIENT SURVEYS:  QuickDASH: 20.5% disability  POSTURE: Rounded shoulder and forward head posture  UPPER EXTREMITY ROM:   Active ROM Right eval Left eval Rt / Lt 09/19/2024 Rt 10/01/2024  Shoulder flexion 170  175 / 175   Shoulder extension      Shoulder abduction 175     Shoulder adduction      Shoulder internal rotation T12   T10  Shoulder external rotation 70 / T4     Elbow flexion      Elbow extension      Wrist flexion      Wrist extension      Wrist ulnar deviation      Wrist radial deviation      Wrist pronation      Wrist supination      (Blank rows = not tested)  Pain reported with functional IR reach behind back  UPPER EXTREMITY MMT:  MMT Right eval Left eval  Shoulder flexion 4 5  Shoulder extension 5 5  Shoulder abduction 4 5  Shoulder adduction    Shoulder internal rotation 5 5  Shoulder external rotation 5 5  Middle trapezius    Lower trapezius    Elbow flexion    Elbow extension    Wrist flexion    Wrist extension    Wrist ulnar deviation    Wrist radial deviation    Wrist pronation    Wrist supination    Grip strength (lbs)    (Blank rows = not tested)  JOINT MOBILITY TESTING:  Grossly WFL  PALPATION:  Tender to palpation right posterior cuff region, deltoid, and bicipital groove region  TREATMENT [facility] Adult PT Treatment:                                                DATE: 10/01/2024 UBE L3 x 4 min (fwd/bwd) to improve endurance and workload capacity Right GHJ mobs primarily posterior and inferior at various ranges of elevation Right shoulder PROM all directions Supine serratus punch with 10# 2 x 15 right Supine horizontal abduction with blue 2 x 15 Sidelying shoulder ER with 3# 2 x 15 Sidelying shoulder abduction palm down with 3# 2 x 15 Bent over row with 30# 3 x 10 each Alternating shoulder taps in modified plantigrade on low table 2 x 20 Scaption with 3# 3 x 12   PATIENT EDUCATION: Education details: HEP Person educated: Patient Education method: Programmer, Multimedia, Demonstration, Actor cues, Verbal cues Education comprehension: verbalized understanding, returned demonstration, verbal cues required, tactile cues required, and needs further education  HOME EXERCISE PROGRAM: Access Code: [email or web address]   ASSESSMENT: CLINICAL IMPRESSION: Patient tolerated therapy well with no adverse effects. Therapy focused on improving right shoulder mobility and strength with good tolerance. He is reporting improvement in symptoms following injection last week and did not report any increased pain with therapy. He does remain slightly limited with his posterior cuff mobility and reports tightness when reach behind his back on the right. No changes made to his HEP this visit. Patient would benefit from continued skilled PT to progress mobility and strength in order to reduce pain and maximize functional ability.   Eval: Patient is a 68 y.o. male who was seen today for physical therapy evaluation and treatment for right shoulder pain following fall. He exhibits primarily limitation with his motion reaching behind his back and does have some strength deficits of the right shoulder. He reports  pain primarily at night that can wake him from sleep, and occasional with shoulder activities that can vary. Performed TPDN this visit for the right shoulder region with good therapeutic benefit and provided progression for rotator cuff strengthening.   OBJECTIVE IMPAIRMENTS: decreased activity tolerance, decreased ROM, decreased strength, postural dysfunction, and pain.   ACTIVITY LIMITATIONS: lifting, sleeping, bathing, and dressing  PARTICIPATION LIMITATIONS: community activity  PERSONAL FACTORS: Fitness, Past/current experiences, and Time since onset of injury/illness/exacerbation are also affecting patient's functional outcome.    GOALS: Goals reviewed with patient? Yes  SHORT TERM GOALS: Target date: 10/01/2024  Patient will be I with initial HEP in order to progress with therapy. Baseline: HEP provided at eval Goal status: INITIAL  2.  Patient will demonstrate right shoulder functional IR with pain or tightness to improve dressing and bathing ability Baseline: see limitations above Goal status: INITIAL  LONG TERM GOALS: Target date: 10/29/2024  Patient will be I with final HEP to maintain progress from PT. Baseline: HEP provided at eval Goal status: INITIAL  2.  Patient will report QuickDASH </= 10% in order to indicate an improvement in their functional status Baseline: 20.5% Goal status: INITIAL  3.  Patient will demonstrate right shoulder strength 5/5 MMT in order to reduce functional limitations with household tasks and exercising at gym Baseline: see limitations above Goal status: INITIAL   PLAN: PT FREQUENCY: 1x/week  PT DURATION: 8 weeks  PLANNED INTERVENTIONS: 97164- PT Re-evaluation, 97750- Physical Performance Testing, 97110-Therapeutic exercises, 97530- Therapeutic activity, [id number]- Neuromuscular re-education, 97535- Self Care, 02859- Manual therapy, 79439 (1-2 muscles),  79438 (3+ muscles)- Dry Needling, Patient/Family education, Joint mobilization, Joint  manipulation, Spinal manipulation, Spinal mobilization, Cryotherapy, and Moist heat  PLAN FOR NEXT SESSION: Review HEP and progress PRN, manual/TPDN for right shoulder, progress rotator cuff strengthening    Elaine Daring, PT, DPT, LAT, ATC 10/01/24  8:49 AM Phone: [phone] Fax: [phone]   PHYSICAL THERAPY DISCHARGE SUMMARY  Visits from Start of Care: 4  Current functional level related to goals / functional outcomes: See above   Remaining deficits: See above   Education / Equipment: HEP   Patient agrees to discharge. Patient goals were partially met. Patient is being discharged due to not returning since the last visit.  Elaine Daring, PT, DPT, LAT, ATC 12/11/2024  1:40 PM Phone: [phone] Fax: [phone]   "

## 2024-10-07 ENCOUNTER — Encounter: Admitting: Physical Therapy

## 2024-10-22 ENCOUNTER — Ambulatory Visit: Admitting: Family Medicine

## 2024-10-22 ENCOUNTER — Encounter: Payer: Self-pay | Admitting: Family Medicine

## 2024-10-22 VITALS — BP 126/84 | HR 79 | Ht 66.0 in | Wt 217.0 lb

## 2024-10-22 DIAGNOSIS — R7303 Prediabetes: Secondary | ICD-10-CM

## 2024-10-22 DIAGNOSIS — G4733 Obstructive sleep apnea (adult) (pediatric): Secondary | ICD-10-CM

## 2024-10-22 DIAGNOSIS — D352 Benign neoplasm of pituitary gland: Secondary | ICD-10-CM

## 2024-10-22 DIAGNOSIS — R7989 Other specified abnormal findings of blood chemistry: Secondary | ICD-10-CM

## 2024-10-22 DIAGNOSIS — I10 Essential (primary) hypertension: Secondary | ICD-10-CM

## 2024-10-22 DIAGNOSIS — E66812 Obesity, class 2: Secondary | ICD-10-CM

## 2024-10-22 DIAGNOSIS — R635 Abnormal weight gain: Secondary | ICD-10-CM

## 2024-10-22 DIAGNOSIS — Z23 Encounter for immunization: Secondary | ICD-10-CM

## 2024-10-22 DIAGNOSIS — J3081 Allergic rhinitis due to animal (cat) (dog) hair and dander: Secondary | ICD-10-CM | POA: Insufficient documentation

## 2024-10-22 DIAGNOSIS — E785 Hyperlipidemia, unspecified: Secondary | ICD-10-CM

## 2024-10-22 DIAGNOSIS — Z6835 Body mass index (BMI) 35.0-35.9, adult: Secondary | ICD-10-CM

## 2024-10-22 LAB — COMPREHENSIVE METABOLIC PANEL WITH GFR
ALT: 32 U/L (ref 0–53)
AST: 30 U/L (ref 0–37)
Albumin: 4.5 g/dL (ref 3.5–5.2)
Alkaline Phosphatase: 41 U/L (ref 39–117)
BUN: 18 mg/dL (ref 6–23)
CO2: 27 meq/L (ref 19–32)
Calcium: 9.2 mg/dL (ref 8.4–10.5)
Chloride: 102 meq/L (ref 96–112)
Creatinine, Ser: 0.89 mg/dL (ref 0.40–1.50)
GFR: 88.9 mL/min (ref >60.00)
Glucose, Bld: 94 mg/dL (ref 70–99)
Potassium: 3.7 meq/L (ref 3.5–5.1)
Sodium: 139 meq/L (ref 135–145)
Total Bilirubin: 0.8 mg/dL (ref 0.2–1.2)
Total Protein: 6.6 g/dL (ref 6.0–8.3)

## 2024-10-22 LAB — PSA: PSA: 0.79 ng/mL (ref 0.10–4.00)

## 2024-10-22 LAB — LIPID PANEL
Cholesterol: 173 mg/dL (ref 0–200)
HDL: 35.7 mg/dL — ABNORMAL LOW (ref >39.00)
LDL Cholesterol: 107 mg/dL — ABNORMAL HIGH (ref 0–99)
NonHDL: 136.82
Total CHOL/HDL Ratio: 5
Triglycerides: 147 mg/dL (ref 0.0–149.0)
VLDL: 29.4 mg/dL (ref 0.0–40.0)

## 2024-10-22 LAB — TESTOSTERONE: Testosterone: 381.25 ng/dL (ref 300.00–890.00)

## 2024-10-22 LAB — TSH: TSH: 2.66 u[IU]/mL (ref 0.35–5.50)

## 2024-10-22 LAB — HEMOGLOBIN A1C: Hgb A1c MFr Bld: 5.9 % (ref 4.6–6.5)

## 2024-10-22 LAB — MICROALBUMIN / CREATININE URINE RATIO
Creatinine,U: 19.7 mg/dL
Microalb Creat Ratio: UNDETERMINED mg/g (ref 0.0–30.0)
Microalb, Ur: <0.7 mg/dL

## 2024-10-22 NOTE — Progress Notes (Signed)
 Diagnoses 1. Weight gain   2. Immunization due   3. Prolactinoma (HCC)   4. OSA (obstructive sleep apnea)   5. Dyslipidemia   6. Low testosterone    7. Prediabetes   8. Essential hypertension   9. Class 2 severe obesity with serious comorbidity and body mass index (BMI) of 35.0 to 35.9 in adult, unspecified obesity type    Assessment & Plan Morbid obesity BMI 35, weight increased to 217 lbs. Previous weight loss with Wegovy  and then Zepbound , discontinued due to reflux. Challenges include stress and insulin  resistance. - Ordered labs: insulin  level, A1c, lipids. - Encouraged logging food intake and monitoring calorie count for review.  Essential hypertension Blood pressure well-controlled with current medication.  Obstructive sleep apnea treated with Inspire device Sleep apnea managed with Inspire device. Improvement in shoulder pain affecting sleep. Plans to follow up for device adjustment. - Continue follow-up with [facility] for Inspire device adjustment.  Right shoulder pain Intermittent nocturnal shoulder pain affecting sleep. Pain improving but still present with stiffness and discomfort. - Continue current management and monitor for improvement.  Prolactinoma treated with cabergoline  Prolactinoma managed with cabergoline  at lower dose. Followed by Endocrinology. Last prolactin level 5.3 in July. Endocrinologist follow-up scheduled for January 14th. - Continue cabergoline  at current dose. - Follow up with endocrinologist on January 14th.  Prediabetes A1c 5.9 in July, indicating prediabetes. Discussed importance of monitoring and managing blood glucose. - Ordered A1c test. - Encouraged lifestyle modifications including diet and exercise.  Dyslipidemia Low HDL 36, LDL 98. Last lipid panel in May, due for update. - Ordered lipid panel.  General Health Maintenance Discussed importance of flu vaccination. - Administered flu shot.  Geni Shutter, DO, MS, FAAFP,  Dipl. [facility] [address] [place], [address] Dept: [phone] Dept Fax: [phone]  Subjective:   Patient is well known to me from my previous clinic and is now establishing care with me as their primary care provider.  Discussed the use of AI scribe software for clinical note transcription with the patient, who gave verbal consent to proceed. History of Present Illness Lucas Young is a 68 year old male with morbid obesity and hypertension who presents for follow-up on weight management and medication review.  Weight management and obesity - Weight increased to 217 pounds from 214 pounds - BMI is 35, consistent with morbid obesity (plus comorbid conditions) - Maintains exercise routine: walks dogs twice daily for 45 minutes, bikes for 30 minutes - Difficulty with weight management attributed to insulin  resistance and stress - Previously used weight management medications including Wegovy , Mounjaro , and Ozempic - Discontinued weight management injection six weeks ago due to acid reflux, which has resolved - Higher doses of weight management medications were effective in the past, resulting in significant weight loss  Hypertension - Blood pressure remains stable on current medication regimen  Metabolic and lipid abnormalities - A1c elevated at 5.9 in July - HDL cholesterol low at 36 - LDL cholesterol 98  Hyperprolactinemia - On lower dose of cabergoline  for prolactin management - Prolactin level last recorded at 5.3 in July  Shoulder pain - Shoulder pain disrupts sleep - Pain is improving  Sleep apnea - Has sleep apnea - Uses Inspire device for management  Past medical history, surgical history, allergies, family history, immunizations andmedications were updated in the EMR today and reviewed under the history and medication portions of their EMR. Review of Systems: Negative, with the exception of  above mentioned in HPI.  Current Outpatient Medications:    Ascorbic Acid (VITAMIN C PO), Take 1 tablet by mouth daily., Disp: , Rfl:    azelastine  (ASTELIN ) 0.1 % nasal spray, Place 2 sprays into both nostrils 2 (two) times daily., Disp: 30 mL, Rfl: 12   cabergoline  (DOSTINEX ) 0.5 MG tablet, Take 0.5 tablets (0.25 mg total) by mouth once a week. Decrease Cabergoline  0.5 mg, 1 tab twice weekly (Monday and Friday), Disp: 2 tablet, Rfl: 3   desloratadine (CLARINEX) 5 MG tablet, Take 5 mg by mouth daily as needed (allergies)., Disp: , Rfl:    diclofenac  (VOLTAREN ) 75 MG EC tablet, Take 1 tablet (75 mg total) by mouth 2 (two) times daily., Disp: 30 tablet, Rfl: 0   EPINEPHrine  0.3 mg/0.3 mL IJ SOAJ injection, Inject 0.3 mg into the muscle as needed for anaphylaxis., Disp: , Rfl:    fluticasone (FLONASE) 50 MCG/ACT nasal spray, Place 1 spray into both nostrils daily as needed for allergies., Disp: , Rfl:    montelukast (SINGULAIR) 10 MG tablet, Take 10 mg by mouth daily as needed (allergies)., Disp: , Rfl:    Multiple Vitamin (MULTIVITAMIN) capsule, Take 1 capsule by mouth daily., Disp: , Rfl:    nitroGLYCERIN  (NITRODUR - DOSED IN MG/24 HR) 0.2 mg/hr patch, 1/4 patch daily, Disp: 30 patch, Rfl: 1   Polyethyl Glycol-Propyl Glycol (SYSTANE OP), Place 1 drop into both eyes daily as needed (dry eyes)., Disp: , Rfl:    telmisartan -hydrochlorothiazide (MICARDIS  HCT) 40-12.5 MG tablet, Take 1 tablet by mouth daily., Disp: 90 tablet, Rfl: 0   VITAMIN K PO, Take 1 capsule by mouth daily., Disp: , Rfl:    Objective:   BP 126/84 (BP Location: Right Arm, Cuff Size: Large)   Pulse 79   Ht 5' 6 (1.676 m)   Wt 217 lb (98.4 kg)   SpO2 95%   BMI 35.02 kg/m   Wt Readings from Last 3 Encounters:  10/22/24 217 lb (98.4 kg)  06/04/24 217 lb (98.4 kg)  12/06/23 210 lb (95.3 kg)   Physical Exam Constitutional:      General: He is not in acute distress.    Appearance: He is well-developed.  HENT:     Head:  Normocephalic and atraumatic.  Eyes:     Conjunctiva/sclera: Conjunctivae normal.  Cardiovascular:     Rate and Rhythm: Normal rate and regular rhythm.     Heart sounds: Normal heart sounds.  Pulmonary:     Effort: Pulmonary effort is normal.     Breath sounds: Normal breath sounds.  Musculoskeletal:     Cervical back: Normal range of motion and neck supple.  Neurological:     General: No focal deficit present.     Mental Status: He is alert and oriented to person, place, and time.  Psychiatric:        Mood and Affect: Mood normal.        Behavior: Behavior normal.    Lab Results  Component Value Date   CREATININE 0.84 06/04/2024   BUN 17 06/04/2024   NA 138 06/04/2024   K 3.7 06/04/2024   CL 99 06/04/2024   CO2 30 06/04/2024   Lab Results  Component Value Date   ALT 29 04/06/2023   AST 30 04/06/2023   ALKPHOS 49 04/06/2023   BILITOT 0.6 04/06/2023   Lab Results  Component Value Date   HGBA1C 5.9 (H) 06/04/2024   HGBA1C 5.8 04/06/2023   HGBA1C 5.6 04/04/2022   HGBA1C  5.6 10/28/2021   HGBA1C 5.8 06/22/2021   Lab Results  Component Value Date   INSULIN  9.7 10/28/2021   INSULIN  14.9 03/24/2021   INSULIN  14.6 12/24/2020   Lab Results  Component Value Date   TSH 3.70 06/04/2024   Lab Results  Component Value Date   CHOL 156 04/06/2023   HDL 36.90 (L) 04/06/2023   LDLCALC 98 04/06/2023   TRIG 108.0 04/06/2023   CHOLHDL 4 04/06/2023   Lab Results  Component Value Date   VD25OH 42.58 04/04/2022   VD25OH 60.6 10/28/2021   VD25OH 75.6 03/24/2021   Lab Results  Component Value Date   WBC 7.6 04/06/2023   HGB 14.9 04/06/2023   HCT 44.1 04/06/2023   MCV 85.5 04/06/2023   PLT 263.0 04/06/2023   Lab Results  Component Value Date   IRON 134 12/24/2020   TIBC 306 12/24/2020   FERRITIN 140 12/24/2020

## 2024-10-23 LAB — PROLACTIN: Prolactin: 7.1 ng/mL (ref 2.0–18.0)

## 2024-11-19 ENCOUNTER — Encounter: Payer: Self-pay | Admitting: Family Medicine

## 2024-11-19 ENCOUNTER — Ambulatory Visit: Admitting: Family Medicine

## 2024-11-19 VITALS — BP 134/86 | HR 81 | Ht 66.0 in | Wt 214.0 lb

## 2024-11-19 DIAGNOSIS — I10 Essential (primary) hypertension: Secondary | ICD-10-CM

## 2024-11-19 DIAGNOSIS — D352 Benign neoplasm of pituitary gland: Secondary | ICD-10-CM

## 2024-11-19 DIAGNOSIS — Z6834 Body mass index (BMI) 34.0-34.9, adult: Secondary | ICD-10-CM

## 2024-11-19 DIAGNOSIS — R7303 Prediabetes: Secondary | ICD-10-CM

## 2024-11-19 DIAGNOSIS — E785 Hyperlipidemia, unspecified: Secondary | ICD-10-CM

## 2024-11-19 DIAGNOSIS — E66811 Obesity, class 1: Secondary | ICD-10-CM

## 2024-11-19 NOTE — Progress Notes (Signed)
 "   Patient Care Team: Prentiss Frieze, DO as PCP - General (Family Medicine)   Assessment & Plan:   1. Essential hypertension (Primary) Comments: Stable on medication. Refilled. - telmisartan -hydrochlorothiazide (MICARDIS  HCT) 40-12.5 MG tablet; Take 1 tablet by mouth daily.  Dispense: 90 tablet; Refill: 0  2. Prediabetes Comments: Stable. A1c 5.9.  3. Dyslipidemia Comments: The 10-year ASCVD risk score (Arnett DK, et al., 2019) is: 19.9%. Need to clarify statin Hx at upcoming visit.  4. Prolactinoma (HCC) Comments: Followed by Endo. Decreased dose of cabergoline  at last visit. Patient will ask about increasing again at next visit (soon).  5. Class 1 obesity with serious comorbidity and body mass index (BMI) of 34.0 to 34.9 in adult, unspecified obesity type  Frieze Prentiss, DO, MS, FAAFP, Dipl. [facility] [address] [place], [address] Dept: [phone] Dept Fax: [phone]  Subjective:   Review of Systems: Negative, with the exception of above mentioned in HPI.  History:   Reviewed by clinician on day of visit: allergies, medications, problem list, medical history, surgical history, family history, social history, and previous encounter notes.    Medications:   Show/hide medication list[1] Allergies[2]  Objective:   BP 134/86 (BP Location: Right Arm, Cuff Size: Large)   Pulse 81   Ht 5' 6 (1.676 m)   Wt 214 lb (97.1 kg)   SpO2 96%   BMI 34.54 kg/m    Physical Exam Constitutional:      General: He is not in acute distress.    Appearance: He is well-developed.  HENT:     Head: Normocephalic and atraumatic.  Eyes:     Conjunctiva/sclera: Conjunctivae normal.  Cardiovascular:     Rate and Rhythm: Normal rate and regular rhythm.     Heart sounds: Normal heart sounds.  Pulmonary:     Effort: Pulmonary effort is normal.     Breath sounds: Normal breath sounds.  Neurological:     General: No focal  deficit present.     Mental Status: He is alert.  Psychiatric:        Behavior: Behavior normal.       Results for orders placed or performed in visit on 10/22/24  Lipid panel   Collection Time: 10/22/24  8:56 AM  Result Value Ref Range   Cholesterol 173 0 - 200 mg/dL   Triglycerides 852.9 0.0 - 149.0 mg/dL   HDL 64.29 (L) >60.99 mg/dL   VLDL 70.5 0.0 - 59.9 mg/dL   LDL Cholesterol 892 (H) 0 - 99 mg/dL   Total CHOL/HDL Ratio 5    NonHDL 136.82   Hemoglobin A1c   Collection Time: 10/22/24  8:56 AM  Result Value Ref Range   Hgb A1c MFr Bld 5.9 4.6 - 6.5 %  Comprehensive metabolic panel with GFR   Collection Time: 10/22/24  8:56 AM  Result Value Ref Range   Sodium 139 135 - 145 mEq/L   Potassium 3.7 3.5 - 5.1 mEq/L   Chloride 102 96 - 112 mEq/L   CO2 27 19 - 32 mEq/L   Glucose, Bld 94 70 - 99 mg/dL   BUN 18 6 - 23 mg/dL   Creatinine, Ser 9.10 0.40 - 1.50 mg/dL   Total Bilirubin 0.8 0.2 - 1.2 mg/dL   Alkaline Phosphatase 41 39 - 117 U/L   AST 30 0 - 37 U/L   ALT 32 0 - 53 U/L   Total Protein 6.6 6.0 - 8.3 g/dL  Albumin 4.5 3.5 - 5.2 g/dL   GFR 11.09 >39.99 mL/min   Calcium 9.2 8.4 - 10.5 mg/dL  Microalbumin / creatinine urine ratio   Collection Time: 10/22/24  8:56 AM  Result Value Ref Range   Microalb, Ur <0.7 mg/dL   Creatinine,U 80.2 mg/dL   Microalb Creat Ratio Unable to calculate 0.0 - 30.0 mg/g  TSH   Collection Time: 10/22/24  8:56 AM  Result Value Ref Range   TSH 2.66 0.35 - 5.50 uIU/mL  Testosterone    Collection Time: 10/22/24  8:56 AM  Result Value Ref Range   Testosterone  381.25 300.00 - 890.00 ng/dL  Prolactin   Collection Time: 10/22/24  8:56 AM  Result Value Ref Range   Prolactin 7.1 2.0 - 18.0 ng/mL  PSA   Collection Time: 10/22/24  8:56 AM  Result Value Ref Range   PSA 0.79 0.10 - 4.00 ng/mL    Attestations:   Reviewed by clinician on day of visit: allergies, medications, problem list, medical history, surgical history, family history,  social history, and previous encounter notes. As the patient's primary care physician, board-certified in Family Medicine and Obesity Medicine, I am providing ongoing, comprehensive obesity care based on the pillars of obesity medicine, including nutrition therapy, physical activity, behavioral modification, and pharmacologic treatment.     [1]  Outpatient Medications Prior to Visit  Medication Sig   Ascorbic Acid (VITAMIN C PO) Take 1 tablet by mouth daily.   azelastine  (ASTELIN ) 0.1 % nasal spray Place 2 sprays into both nostrils 2 (two) times daily.   cabergoline  (DOSTINEX ) 0.5 MG tablet Take 0.5 tablets (0.25 mg total) by mouth once a week. Decrease Cabergoline  0.5 mg, 1 tab twice weekly (Monday and Friday)   desloratadine (CLARINEX) 5 MG tablet Take 5 mg by mouth daily as needed (allergies).   diclofenac  (VOLTAREN ) 75 MG EC tablet Take 1 tablet (75 mg total) by mouth 2 (two) times daily.   EPINEPHrine  0.3 mg/0.3 mL IJ SOAJ injection Inject 0.3 mg into the muscle as needed for anaphylaxis.   fluticasone (FLONASE) 50 MCG/ACT nasal spray Place 1 spray into both nostrils daily as needed for allergies.   montelukast (SINGULAIR) 10 MG tablet Take 10 mg by mouth daily as needed (allergies).   Multiple Vitamin (MULTIVITAMIN) capsule Take 1 capsule by mouth daily.   nitroGLYCERIN  (NITRODUR - DOSED IN MG/24 HR) 0.2 mg/hr patch 1/4 patch daily   Polyethyl Glycol-Propyl Glycol (SYSTANE OP) Place 1 drop into both eyes daily as needed (dry eyes).   VITAMIN K PO Take 1 capsule by mouth daily.   [DISCONTINUED] telmisartan -hydrochlorothiazide (MICARDIS  HCT) 40-12.5 MG tablet Take 1 tablet by mouth daily.   No facility-administered medications prior to visit.  [2]  Allergies Allergen Reactions   Grass Pollen(K-O-R-T-Swt Vern) Other (See Comments)   Molds & Smuts Other (See Comments)   Pollen Extract    Thimerosal (Thiomersal) Itching    Eye redness    "

## 2024-11-22 ENCOUNTER — Other Ambulatory Visit: Payer: Self-pay | Admitting: Family Medicine

## 2024-11-25 NOTE — Progress Notes (Unsigned)
 " Lucas Young [facility] [address] [place] [address] Phone: [phone] Subjective:   Lucas Young, am serving as a scribe for Dr. Arthea Young.  I'm seeing this patient by the request  of:  Prentiss Frieze, DO  CC: Back and neck pain follow-up  YEP:Dlagzrupcz  Lucas Young is a 68 y.o. male coming in with complaint of back and neck pain. OMT on 09/26/2024. Patient states same per usual. No new symptoms. Doing okay. Wanted to ask about shoulders.  Medications patient has been prescribed:   Taking:         Reviewed prior external information including notes and imaging from previsou exam, outside providers and external EMR if available.   As well as notes that were available from care everywhere and other healthcare systems.  Past medical history, social, surgical and family history all reviewed in electronic medical record.  No pertanent information unless stated regarding to the chief complaint.   Past Medical History:  Diagnosis Date   Allergy 2015   Pollen, mold (taking weekly shots, getting better)   Anemia    as a kid per patient   Anxiety    Arthritis Minor (neck)   Seeing Lucas Young   Back pain    Depression    Food allergy    Hyperlipidemia Treated by [facility] Gatha Prentiss)   Hypertension Slight/occasional   white coat syndrome per patient   Lactose intolerance    Metabolic syndrome    per patient- seeing weight loss MD for this   Morton's neuroma    Obesity    Pneumonia    walking pneumonia 16-17 yr ago   Pre-diabetes    history of pre-DM- out of zone d/t weight loss   Sleep apnea Inspire implant 04/2022   uses CPAP    Allergies[1]   Review of Systems:  No headache, visual changes, nausea, vomiting, diarrhea, constipation, dizziness, abdominal pain, skin rash, fevers, chills, night sweats, weight loss, swollen lymph nodes, body aches, joint swelling, chest pain, shortness of breath,  mood changes. POSITIVE muscle aches  Objective  Blood pressure (!) 130/90, pulse 83, height 5' 6 (1.676 m), SpO2 95%.   General: No apparent distress alert and oriented x3 mood and affect normal, dressed appropriately.  HEENT: Pupils equal, extraocular movements intact  Respiratory: Patient's speak in full sentences and does not appear short of breath  Cardiovascular: No lower extremity edema, non tender, no erythema  Gait overall normal MSK:  Back more tightness noted in the shoulder blades bilaterally.  Neck exam does have some limited sidebending bilaterally.  Osteopathic findings  C2 flexed rotated and side bent right C5 flexed rotated and side bent right T3 extended rotated and side bent right inhaled rib T9 extended rotated and side bent left L2 flexed rotated and side bent right L3 flexed rotated left side bent left Sacrum right on right       Assessment and Plan:  Degenerative disc disease, cervical Degenerative disc disease.  Discussed icing regimen and home exercises, discussed which activities to do and which ones to avoid.  Increase activity slowly.  Discussed icing regimen.  Follow-up again in 6 to 12 weeks    Nonallopathic problems  Decision today to treat with OMT was based on Physical Exam  After verbal consent patient was treated with HVLA, ME, FPR techniques in cervical, rib, thoracic, lumbar, and sacral  areas  Patient tolerated the procedure well with improvement in symptoms  Patient  given exercises, stretches and lifestyle modifications  See medications in patient instructions if given  Patient will follow up in 4-8 weeks    The above documentation has been reviewed and is accurate and complete Seynabou Fults M Ziza Hastings, DO          Note: This dictation was prepared with Dragon dictation along with smaller phrase technology. Any transcriptional errors that result from this process are unintentional.            [1]  Allergies Allergen  Reactions   Grass Pollen(K-O-R-T-Swt Vern) Other (See Comments)   Molds & Smuts Other (See Comments)   Pollen Extract    Thimerosal (Thiomersal) Itching    Eye redness    "

## 2024-11-26 ENCOUNTER — Encounter: Payer: Self-pay | Admitting: Family Medicine

## 2024-11-26 ENCOUNTER — Ambulatory Visit: Admitting: Family Medicine

## 2024-11-26 VITALS — BP 130/90 | HR 83 | Ht 66.0 in

## 2024-11-26 DIAGNOSIS — M9908 Segmental and somatic dysfunction of rib cage: Secondary | ICD-10-CM | POA: Diagnosis not present

## 2024-11-26 DIAGNOSIS — M5030 Other cervical disc degeneration, unspecified cervical region: Secondary | ICD-10-CM

## 2024-11-26 DIAGNOSIS — M9901 Segmental and somatic dysfunction of cervical region: Secondary | ICD-10-CM | POA: Diagnosis not present

## 2024-11-26 DIAGNOSIS — M9903 Segmental and somatic dysfunction of lumbar region: Secondary | ICD-10-CM

## 2024-11-26 DIAGNOSIS — M9904 Segmental and somatic dysfunction of sacral region: Secondary | ICD-10-CM

## 2024-11-26 DIAGNOSIS — M9902 Segmental and somatic dysfunction of thoracic region: Secondary | ICD-10-CM | POA: Diagnosis not present

## 2024-11-26 MED ORDER — TELMISARTAN-HCTZ 40-12.5 MG PO TABS: 1.0000 | ORAL_TABLET | Freq: Every day | ORAL | 0 refills | Status: AC

## 2024-11-26 NOTE — Patient Instructions (Signed)
 Keep monitors at eye level Keep hands in peripheral vision Do prescribed exercises at least 3x a week

## 2024-11-26 NOTE — Assessment & Plan Note (Signed)
 Degenerative disc disease.  Discussed icing regimen and home exercises, discussed which activities to do and which ones to avoid.  Increase activity slowly.  Discussed icing regimen.  Follow-up again in 6 to 12 weeks

## 2024-12-09 NOTE — Telephone Encounter (Signed)
 Pt would like to go ahead and try a script for the Wegovy  Oral. Please send to pts pharmacy if appropriate.

## 2024-12-10 NOTE — Progress Notes (Unsigned)
 "  Name: Lucas Young  MRN/ DOB: [id number], 24-Nov-1956    Age/ Sex: 69 y.o., male     PCP: Prentiss Frieze, DO   Reason for Endocrinology Evaluation: Hyperprolactinemia/hypogonadism     Initial Endocrinology Clinic Visit: 04/19/2021    PATIENT IDENTIFIER: Lucas Young is a 69 y.o., male with a past medical history of prolactinoma. He has followed with [facility] since 04/19/2021 for consultative assistance with management of his hyperprolactinemia.   HISTORICAL SUMMARY: The patient was first diagnosed with hyperprolactinemia in May 2022 with a prolactin level of 117.5 NG/mL.  He was started on cabergoline  at the time   Of note the patient has also been noted with low testosterone  at the time with a nadir of 101 NG/dL in 02/7976 but this has normalized   Patient has 2 biological children  Pituitary MRI did NOT show any pituitary adenoma 04/2021  SUBJECTIVE:    Today (12/11/2024):  Lucas Young is here for a follow-up on hyperprolactinemia and Hx of hypogonadism.  He follows with PCP for weight managements. Switching from Zepbound  to Wegovy  tabs , Weight stable    Denies headaches  No vision changes  No nipple discharge  No nausea since being of Zepbound   Has changes in Bowel movements recently but not chronic  Denies erectile dysfunction     Cabergoline  0.5 mg, half  tab  weekly      HISTORY:  Past Medical History:  Past Medical History:  Diagnosis Date   Allergy 2015   Pollen, mold (taking weekly shots, getting better)   Anemia    as a kid per patient   Anxiety    Arthritis Minor (neck)   Seeing Darlyn Sharps   Back pain    Depression    Food allergy    Hyperlipidemia Treated by [facility] Gatha Prentiss)   Hypertension Slight/occasional   white coat syndrome per patient   Lactose intolerance    Metabolic syndrome    per patient- seeing weight loss MD for this   Morton's neuroma    Obesity    Pneumonia     walking pneumonia 16-17 yr ago   Pre-diabetes    history of pre-DM- out of zone d/t weight loss   Sleep apnea Inspire implant 04/2022   uses CPAP   Past Surgical History:  Past Surgical History:  Procedure Laterality Date   APPENDECTOMY  October 1978   COLONOSCOPY  2010   Dr.Medoff  normal per pt   DRUG INDUCED ENDOSCOPY N/A 02/01/2022   Procedure: DRUG INDUCED SPLEEP ENDOSCOPY;  Surgeon: Carlie Clark, MD;  Location: [facility];  Service: ENT;  Laterality: N/A;   HEMATOMA EVACUATION Right 04/28/2022   Procedure: EVACUATION HEMATOMA OF NECK;  Surgeon: Carlie Clark, MD;  Location: [facility] OR;  Service: ENT;  Laterality: Right;   HERNIA REPAIR  2002   Umbilical hernia repair   IMPLANTATION OF HYPOGLOSSAL NERVE STIMULATOR Right 04/26/2022   Procedure: IMPLANTATION OF HYPOGLOSSAL NERVE STIMULATOR;  Surgeon: Carlie Clark, MD;  Location: [facility] OR;  Service: ENT;  Laterality: Right;   UMBILICAL HERNIA REPAIR     Social History:  reports that he has never smoked. He has never used smokeless tobacco. He reports that he does not drink alcohol and does not use drugs. Family History:  Family History  Problem Relation Age of Onset   Hypertension Mother    Sleep apnea Mother    Obesity Mother    Colon polyps Father  Hypertension Father    Hyperlipidemia Father    Heart disease Father    Sleep apnea Father    Obesity Father    Arthritis Father    COPD Father    Obesity Brother    Hypertension Maternal Grandmother    Hypertension Maternal Grandfather    Other Neg Hx        low testosterone    Colon cancer Neg Hx    Esophageal cancer Neg Hx    Stomach cancer Neg Hx    Rectal cancer Neg Hx      HOME MEDICATIONS: Allergies as of 12/11/2024       Reactions   Grass Pollen(k-o-r-t-swt Vern) Other (See Comments)   Molds & Smuts Other (See Comments)   Pollen Extract    Thimerosal (thiomersal) Itching   Eye redness         Medication List        Accurate as of  December 11, 2024  7:36 AM. If you have any questions, ask your nurse or doctor.          azelastine  0.1 % nasal spray Commonly known as: ASTELIN  Place 2 sprays into both nostrils 2 (two) times daily.   cabergoline  0.5 MG tablet Commonly known as: DOSTINEX  Take 0.5 tablets (0.25 mg total) by mouth once a week. Decrease Cabergoline  0.5 mg, 1 tab twice weekly (Monday and Friday)   desloratadine 5 MG tablet Commonly known as: CLARINEX Take 5 mg by mouth daily as needed (allergies).   diclofenac  75 MG EC tablet Commonly known as: VOLTAREN  Take 1 tablet (75 mg total) by mouth 2 (two) times daily.   EPINEPHrine  0.3 mg/0.3 mL Soaj injection Commonly known as: EPI-PEN Inject 0.3 mg into the muscle as needed for anaphylaxis.   fluticasone 50 MCG/ACT nasal spray Commonly known as: FLONASE Place 1 spray into both nostrils daily as needed for allergies.   montelukast 10 MG tablet Commonly known as: SINGULAIR Take 10 mg by mouth daily as needed (allergies).   multivitamin capsule Take 1 capsule by mouth daily.   nitroGLYCERIN  0.2 mg/hr patch Commonly known as: NITRODUR - Dosed in mg/24 hr 1/4 patch daily   SYSTANE OP Place 1 drop into both eyes daily as needed (dry eyes).   telmisartan -hydrochlorothiazide 40-12.5 MG tablet Commonly known as: MICARDIS  HCT Take 1 tablet by mouth daily.   telmisartan -hydrochlorothiazide 40-12.5 MG tablet Commonly known as: MICARDIS  HCT Take 1 tablet by mouth daily.   VITAMIN C PO Take 1 tablet by mouth daily.   VITAMIN K PO Take 1 capsule by mouth daily.          OBJECTIVE:   PHYSICAL EXAM: VS: BP 122/80   Pulse 66   Ht 5' 6 (1.676 m)   SpO2 95%   BMI 34.54 kg/m    EXAM: General: Pt appears well and is in NAD  Lungs: Clear with good BS bilat   Heart: Auscultation: RRR.  Abdomen: Soft, nontender  Extremities:  BL LE: No pretibial edema   Mental Status: Judgment, insight: Intact Orientation: Oriented to time, place, and  person Mood and affect: No depression, anxiety, or agitation     DATA REVIEWED:  Latest Reference Range & Units 10/22/24 08:56  TSH 0.35 - 5.50 uIU/mL 2.66     Latest Reference Range & Units 10/22/24 08:56  Prolactin 2.0 - 18.0 ng/mL 7.1  Glucose 70 - 99 mg/dL 94  Hemoglobin J8R 4.6 - 6.5 % 5.9  Testosterone  300.00 - 890.00 ng/dL 618.74  MRI brain 05/16/2021   Dedicated pituitary protocol was performed. 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 The pituitary parenchyma enhances relatively homogeneously on dynamic protocol without convincing discrete pituitary lesion. The infundibulum is midline and unremarkable. No suprasellar extension of pituitary parenchyma. Unremarkable optic chiasm.    ASSESSMENT / PLAN / RECOMMENDATIONS:   Prolactinoma   -Patient asymptomatic -MRI in 2022 did NOT reveal any pituitary adenoma - He has had recent prolactin checked through PCPs office, he is concerned that his prolactin level has increased from 5.3 to 7.1 NG/mL. - I did explain to the patient that despite the fluctuation his prolactin level remains within normal range, no changes to cabergoline  at this time   Medications   Cabergoline  0.5 mg,HALF a  tab ONCE  weekly    2. Hx low testosterone :  - This remains within normal range  Follow-up in 1 yr     Signed electronically by: Stefano Redgie Butts, MD  [facility] Endocrinology  [facility] [address] [place], [place] [address] Phone: [phone] FAX: [phone]      CC: Prentiss Frieze, DO [address] [place] [address] Phone: [phone]  Fax: [phone]   Return to Endocrinology clinic as below: Future Appointments  Date Time Provider Department Center  12/17/2024  8:00 AM Prentiss Frieze, DO [facility]  01/07/2025  8:30 AM Claudene Arthea HERO, DO [facility] None    "

## 2024-12-10 NOTE — Telephone Encounter (Signed)
 I have called pts pharmacy, [facility], and have left a message authorizing a new script for pt for Wegovy  1.4mg  tablets for #30 day without refills, and a script for Wegovy  4mg  tablets #30 days to be taken AFTER he finishes the 1.4mg  script. Message sent to pt letting him know this.

## 2024-12-11 ENCOUNTER — Ambulatory Visit: Admitting: Internal Medicine

## 2024-12-11 VITALS — BP 122/80 | HR 66 | Ht 66.0 in

## 2024-12-11 DIAGNOSIS — D352 Benign neoplasm of pituitary gland: Secondary | ICD-10-CM | POA: Diagnosis not present

## 2024-12-11 MED ORDER — CABERGOLINE 0.5 MG PO TABS: 0.2500 mg | ORAL_TABLET | ORAL | 3 refills | Status: AC

## 2024-12-16 NOTE — Progress Notes (Unsigned)
 "    Patient Care Team: Prentiss Frieze, DO as PCP - General (Family Medicine)  Weight Management:   Starting weight: *** Starting date: *** Today's weight: *** Today's date: 12/16/2024 Total lbs lost to date: *** Total lbs lost since last in-office visit: *** Total weight loss percentage to date: -***% There is no height or weight on file to calculate BMI.   Resting Metabolic Rate: *** Nutrition Plan: {EW NUTRITION HNJOD:65522}. Anti-obesity medications (including off-label): ***. Reported side effects: ***. Hunger is {EWCONTROLASSESSMENT:24261}. Cravings are {EWCONTROLASSESSMENT:24261}. Activity: {MWM EXERCISE RECS:23473}. Sleep: Number of hours slept each night: ***. Sleep {ACTION; IS/IS NOT:21021397} restful.   Diagnoses and Orders:   No diagnosis found. No orders of the defined types were placed in this encounter.  No orders of the defined types were placed in this encounter.  Assessment & Plan:   Assessment and Plan Assessment & Plan     Frieze Prentiss, DO, MS (Nutrition), FAAFP, Dipl. ABOM Fellow, American Academy of Family Physicians Diplomate, American Board of Obesity Medicine [facility] at [facility] [address] [place], [place] [address] Dept: [phone] Fax: [phone]  Subjective:   History of Present Illness   Review of Systems: Negative, with the exception of above mentioned in HPI.  History:   Reviewed by clinician on day of visit: allergies, medications, problem list, medical history, surgical history, family history, social history, and previous encounter notes.  Medications:   Show/hide medication list[1] Allergies[2]  Objective:   There were no vitals taken for this visit. {Insert last BP/Wt (optional):23777}{See vitals history (optional):1}   Physical Exam {Insert previous labs (optional):23779} {See past labs  Heme  Chem  Endocrine  Serology  Results Review  (optional):1}  Results for orders placed or performed in visit on 10/22/24  Lipid panel   Collection Time: 10/22/24  8:56 AM  Result Value Ref Range   Cholesterol 173 0 - 200 mg/dL   Triglycerides 852.9 0.0 - 149.0 mg/dL   HDL 64.29 (L) >60.99 mg/dL   VLDL 70.5 0.0 - 59.9 mg/dL   LDL Cholesterol 892 (H) 0 - 99 mg/dL   Total CHOL/HDL Ratio 5    NonHDL 136.82   Hemoglobin A1c   Collection Time: 10/22/24  8:56 AM  Result Value Ref Range   Hgb A1c MFr Bld 5.9 4.6 - 6.5 %  Comprehensive metabolic panel with GFR   Collection Time: 10/22/24  8:56 AM  Result Value Ref Range   Sodium 139 135 - 145 mEq/L   Potassium 3.7 3.5 - 5.1 mEq/L   Chloride 102 96 - 112 mEq/L   CO2 27 19 - 32 mEq/L   Glucose, Bld 94 70 - 99 mg/dL   BUN 18 6 - 23 mg/dL   Creatinine, Ser 9.10 0.40 - 1.50 mg/dL   Total Bilirubin 0.8 0.2 - 1.2 mg/dL   Alkaline Phosphatase 41 39 - 117 U/L   AST 30 0 - 37 U/L   ALT 32 0 - 53 U/L   Total Protein 6.6 6.0 - 8.3 g/dL   Albumin 4.5 3.5 - 5.2 g/dL   GFR 11.09 >39.99 mL/min   Calcium 9.2 8.4 - 10.5 mg/dL  Microalbumin / creatinine urine ratio   Collection Time: 10/22/24  8:56 AM  Result Value Ref Range   Microalb, Ur <0.7 mg/dL   Creatinine,U 80.2 mg/dL   Microalb Creat Ratio Unable to calculate 0.0 - 30.0 mg/g  TSH   Collection Time: 10/22/24  8:56 AM  Result  Value Ref Range   TSH 2.66 0.35 - 5.50 uIU/mL  Testosterone    Collection Time: 10/22/24  8:56 AM  Result Value Ref Range   Testosterone  381.25 300.00 - 890.00 ng/dL  Prolactin   Collection Time: 10/22/24  8:56 AM  Result Value Ref Range   Prolactin 7.1 2.0 - 18.0 ng/mL  PSA   Collection Time: 10/22/24  8:56 AM  Result Value Ref Range   PSA 0.79 0.10 - 4.00 ng/mL    12/16/2024    PHQ2-9 Depression Screening   Little interest or pleasure in doing things    Feeling down, depressed, or hopeless    PHQ-2 - Total Score    Trouble falling or staying asleep, or sleeping too much    Feeling tired or having  little energy    Poor appetite or overeating     Feeling bad about yourself - or that you are a failure or have let yourself or your family down    Trouble concentrating on things, such as reading the newspaper or watching television    Moving or speaking so slowly that other people could have noticed.  Or the opposite - being so fidgety or restless that you have been moving around a lot more than usual    Thoughts that you would be better off dead, or hurting yourself in some way    PHQ2-9 Total Score    If you checked off any problems, how difficult have these problems made it for you to do your work, take care of things at home, or get along with other people    Depression Interventions/Treatment         10/22/2024    8:17 AM  GAD 7 : Generalized Anxiety Score  Nervous, Anxious, on Edge 0   Control/stop worrying 0   Worry too much - different things 0   Trouble relaxing 0   Restless 0   Easily annoyed or irritable 0   Afraid - awful might happen 0   Total GAD 7 Score 0  Anxiety Difficulty Not difficult at all     Data saved with a previous flowsheet row definition   Attestations:   {EW ATTESTATIONS:34266}  Geni Shutter, DO, MS, FAAFP, Dipl. [facility] at [facility] [address] [place], [address] Dept: [phone] Dept Fax: [phone]    [1]  Outpatient Medications Prior to Visit  Medication Sig   Ascorbic Acid (VITAMIN C PO) Take 1 tablet by mouth daily.   azelastine  (ASTELIN ) 0.1 % nasal spray Place 2 sprays into both nostrils 2 (two) times daily. (Patient not taking: Reported on 12/11/2024)   cabergoline  (DOSTINEX ) 0.5 MG tablet Take 0.5 tablets (0.25 mg total) by mouth once a week.   desloratadine (CLARINEX) 5 MG tablet Take 5 mg by mouth daily as needed (allergies).   diclofenac  (VOLTAREN ) 75 MG EC tablet Take 1 tablet (75 mg total) by mouth 2 (two) times daily. (Patient not taking: Reported on 12/11/2024)   EPINEPHrine   0.3 mg/0.3 mL IJ SOAJ injection Inject 0.3 mg into the muscle as needed for anaphylaxis.   fluticasone (FLONASE) 50 MCG/ACT nasal spray Place 1 spray into both nostrils daily as needed for allergies. (Patient not taking: Reported on 12/11/2024)   montelukast (SINGULAIR) 10 MG tablet Take 10 mg by mouth daily as needed (allergies).   Multiple Vitamin (MULTIVITAMIN) capsule Take 1 capsule by mouth daily.   nitroGLYCERIN  (NITRODUR - DOSED IN MG/24 HR) 0.2 mg/hr patch 1/4 patch daily (  Patient not taking: Reported on 12/11/2024)   Polyethyl Glycol-Propyl Glycol (SYSTANE OP) Place 1 drop into both eyes daily as needed (dry eyes).   telmisartan -hydrochlorothiazide (MICARDIS  HCT) 40-12.5 MG tablet Take 1 tablet by mouth daily.   telmisartan -hydrochlorothiazide (MICARDIS  HCT) 40-12.5 MG tablet Take 1 tablet by mouth daily.   VITAMIN K PO Take 1 capsule by mouth daily.   No facility-administered medications prior to visit.  [2]  Allergies Allergen Reactions   Grass Pollen(K-O-R-T-Swt Vern) Other (See Comments)   Molds & Smuts Other (See Comments)   Pollen Extract    Thimerosal (Thiomersal) Itching    Eye redness    "

## 2024-12-17 ENCOUNTER — Encounter: Payer: Self-pay | Admitting: Family Medicine

## 2024-12-17 ENCOUNTER — Ambulatory Visit: Admitting: Family Medicine

## 2024-12-17 VITALS — BP 128/80 | HR 79 | Temp 98.2°F | Ht 66.0 in | Wt 215.4 lb

## 2024-12-17 DIAGNOSIS — K760 Fatty (change of) liver, not elsewhere classified: Secondary | ICD-10-CM

## 2024-12-17 DIAGNOSIS — Z6834 Body mass index (BMI) 34.0-34.9, adult: Secondary | ICD-10-CM

## 2024-12-17 DIAGNOSIS — E785 Hyperlipidemia, unspecified: Secondary | ICD-10-CM

## 2024-12-17 DIAGNOSIS — I10 Essential (primary) hypertension: Secondary | ICD-10-CM

## 2024-12-17 DIAGNOSIS — G4733 Obstructive sleep apnea (adult) (pediatric): Secondary | ICD-10-CM

## 2024-12-17 DIAGNOSIS — E66811 Obesity, class 1: Secondary | ICD-10-CM

## 2024-12-17 MED ORDER — SEMAGLUTIDE-WEIGHT MANAGEMENT 4 MG PO TABS: 4.0000 mg | ORAL_TABLET | Freq: Every day | ORAL | 0 refills | Status: AC

## 2024-12-17 NOTE — Progress Notes (Signed)
 "    Patient Care Team: Prentiss Frieze, DO as PCP - General (Family Medicine)  Weight Management:   Starting weight: 226lb Starting date: 12/24/2020 Today's weight: 215lb Today's date: 12/17/2024 Total lbs lost to date: 9lb Total lbs lost since last in-office visit: +1lb Total weight loss percentage to date: -4.87% Body mass index is 34.77 kg/m.    Nutrition Plan: 1500-1800 calories/125 g protein.   Adherence to Nutrition Plan: fair 70%.   Current Anti-Obesity Medication, including off-label: Wegovy  1.5mg  pills daily.  Recent Physical Activity: Cardio 60-75 minutes 7 days per week & strength 40 minutes 6 days per week.   Sleep: - He is usually sleeping around 7-8 hours per night, with one interruption each night.    Assessment & Plan:   Ajamu was seen today for medical management of chronic issues.  Diagnoses and all orders for this visit:  Essential hypertension The current medical regimen is effective;  continue present plan and medications.  OSA (obstructive sleep apnea) Has Inspire device. No concerns today. Followed by Dr. Carlie, ENT and [facility]. No reflux symptoms.  Hepatic steatosis Discovered on ultrasound. Labs WNL.  Dyslipidemia Lab Results  Component Value Date   CHOL 173 10/22/2024   HDL 35.70 (L) 10/22/2024   LDLCALC 107 (H) 10/22/2024   TRIG 147.0 10/22/2024   CHOLHDL 5 10/22/2024   Class 1 obesity with serious comorbidity and body mass index (BMI) of 34.0 to 34.9 in adult, unspecified obesity type Patient is under the care of an Obesity Medicine-certified physician providing longitudinal care using a comprehensive, pillar-based obesity treatment model (nutrition therapy, physical activity counseling, behavioral modification, pharmacotherapy when indicated, and medical monitoring), consistent with standards outlined by the Obesity Medicine Association. Obesity is treated as a medical disease, not a cosmetic condition. Pharmacotherapy is expected  to improve obesity-related comorbid conditions, including obstructive sleep apnea, cardiovascular risk, and/or metabolic liver disease.   Frieze Prentiss, DO, MS (Nutrition), FAAFP, Dipl. ABOM Fellow, American Academy of Family Physicians Diplomate, American Board of Obesity Medicine [facility] Primary Care at [facility] [address] [place], [place] [address] Dept: [phone] Fax: [phone]  Subjective:   Review of Systems: Negative, with the exception of above mentioned in HPI.  History:   Reviewed by clinician on day of visit: allergies, medications, problem list, medical history, surgical history, family history, social history, and previous encounter notes.  Medications:   Show/hide medication list[1] Allergies[2]  Objective:   BP 128/80   Pulse 79   Temp 98.2 F (36.8 C)   Ht 5' 6 (1.676 m)   Wt 215 lb 6.4 oz (97.7 kg)   SpO2 92%   BMI 34.77 kg/m   Physical Exam Constitutional:      General: He is not in acute distress.    Appearance: He is well-developed.  HENT:     Head: Normocephalic and atraumatic.  Eyes:     Conjunctiva/sclera: Conjunctivae normal.  Cardiovascular:     Rate and Rhythm: Normal rate and regular rhythm.     Heart sounds: Normal heart sounds.  Pulmonary:     Effort: Pulmonary effort is normal.     Breath sounds: Normal breath sounds.  Neurological:     General: No focal deficit present.     Mental Status: He is alert.  Psychiatric:        Behavior: Behavior normal.    Results for orders placed or performed in visit on 10/22/24  Lipid panel   Collection Time: 10/22/24  8:56 AM  Result Value Ref Range   Cholesterol 173 0 - 200 mg/dL   Triglycerides 852.9 0.0 - 149.0 mg/dL   HDL 64.29 (L) >60.99 mg/dL   VLDL 70.5 0.0 - 59.9 mg/dL   LDL Cholesterol 892 (H) 0 - 99 mg/dL   Total CHOL/HDL Ratio 5    NonHDL 136.82   Hemoglobin A1c   Collection Time: 10/22/24  8:56 AM  Result Value Ref Range   Hgb A1c  MFr Bld 5.9 4.6 - 6.5 %  Comprehensive metabolic panel with GFR   Collection Time: 10/22/24  8:56 AM  Result Value Ref Range   Sodium 139 135 - 145 mEq/L   Potassium 3.7 3.5 - 5.1 mEq/L   Chloride 102 96 - 112 mEq/L   CO2 27 19 - 32 mEq/L   Glucose, Bld 94 70 - 99 mg/dL   BUN 18 6 - 23 mg/dL   Creatinine, Ser 9.10 0.40 - 1.50 mg/dL   Total Bilirubin 0.8 0.2 - 1.2 mg/dL   Alkaline Phosphatase 41 39 - 117 U/L   AST 30 0 - 37 U/L   ALT 32 0 - 53 U/L   Total Protein 6.6 6.0 - 8.3 g/dL   Albumin 4.5 3.5 - 5.2 g/dL   GFR 11.09 >39.99 mL/min   Calcium 9.2 8.4 - 10.5 mg/dL  Microalbumin / creatinine urine ratio   Collection Time: 10/22/24  8:56 AM  Result Value Ref Range   Microalb, Ur <0.7 mg/dL   Creatinine,U 80.2 mg/dL   Microalb Creat Ratio Unable to calculate 0.0 - 30.0 mg/g  TSH   Collection Time: 10/22/24  8:56 AM  Result Value Ref Range   TSH 2.66 0.35 - 5.50 uIU/mL  Testosterone    Collection Time: 10/22/24  8:56 AM  Result Value Ref Range   Testosterone  381.25 300.00 - 890.00 ng/dL  Prolactin   Collection Time: 10/22/24  8:56 AM  Result Value Ref Range   Prolactin 7.1 2.0 - 18.0 ng/mL  PSA   Collection Time: 10/22/24  8:56 AM  Result Value Ref Range   PSA 0.79 0.10 - 4.00 ng/mL    Attestations:   Reviewed by clinician on day of visit: allergies, medications, problem list, medical history, surgical history, family history, social history, and previous encounter notes. As the patient's PCP, board-certified in [facility] Medicine and Obesity Medicine, I am providing ongoing, guideline-directed obesity management utilizing all pillars of care, including lifestyle intervention and FDA-approved anti-obesity pharmacotherapy.      [1]  Outpatient Medications Prior to Visit  Medication Sig   Ascorbic Acid (VITAMIN C PO) Take 1 tablet by mouth daily.   desloratadine (CLARINEX) 5 MG tablet Take 5 mg by mouth daily as needed (allergies).   EPINEPHrine  0.3 mg/0.3 mL IJ SOAJ  injection Inject 0.3 mg into the muscle as needed for anaphylaxis.   montelukast (SINGULAIR) 10 MG tablet Take 10 mg by mouth daily as needed (allergies).   Multiple Vitamin (MULTIVITAMIN) capsule Take 1 capsule by mouth daily.   Polyethyl Glycol-Propyl Glycol (SYSTANE OP) Place 1 drop into both eyes daily as needed (dry eyes).   semaglutide -weight management (WEGOVY ) 1.5 MG tablet Take 1.5 mg by mouth daily. Daily in AM on an empty stomach with 4 oz of water. Do not eat or drink for 30 minutes after dose.   telmisartan -hydrochlorothiazide (MICARDIS  HCT) 40-12.5 MG tablet Take 1 tablet by mouth daily.   telmisartan -hydrochlorothiazide (MICARDIS  HCT) 40-12.5 MG tablet Take 1 tablet by mouth daily.   VITAMIN  K PO Take 1 capsule by mouth daily.   azelastine  (ASTELIN ) 0.1 % nasal spray Place 2 sprays into both nostrils 2 (two) times daily. (Patient not taking: Reported on 12/11/2024)   cabergoline  (DOSTINEX ) 0.5 MG tablet Take 0.5 tablets (0.25 mg total) by mouth once a week.   diclofenac  (VOLTAREN ) 75 MG EC tablet Take 1 tablet (75 mg total) by mouth 2 (two) times daily. (Patient not taking: Reported on 12/11/2024)   fluticasone (FLONASE) 50 MCG/ACT nasal spray Place 1 spray into both nostrils daily as needed for allergies. (Patient not taking: Reported on 12/11/2024)   nitroGLYCERIN  (NITRODUR - DOSED IN MG/24 HR) 0.2 mg/hr patch 1/4 patch daily (Patient not taking: Reported on 12/11/2024)   No facility-administered medications prior to visit.  [2]  Allergies Allergen Reactions   Grass Pollen(K-O-R-T-Swt Vern) Other (See Comments)   Molds & Smuts Other (See Comments)   Pollen Extract    Thimerosal (Thiomersal) Itching    Eye redness    "

## 2025-01-07 ENCOUNTER — Ambulatory Visit: Admitting: Family Medicine

## 2025-01-16 ENCOUNTER — Ambulatory Visit: Admitting: Family Medicine

## 2025-12-11 ENCOUNTER — Ambulatory Visit: Admitting: Internal Medicine
